# Patient Record
Sex: Female | Born: 1940
Health system: Southern US, Community
[De-identification: ages and names within clinical notes are randomized; demographics above are authoritative.]

## PROBLEM LIST (undated history)

## (undated) DIAGNOSIS — I10 Essential (primary) hypertension: Secondary | ICD-10-CM

## (undated) DIAGNOSIS — D649 Anemia, unspecified: Secondary | ICD-10-CM

## (undated) DIAGNOSIS — R0600 Dyspnea, unspecified: Secondary | ICD-10-CM

## (undated) DIAGNOSIS — K219 Gastro-esophageal reflux disease without esophagitis: Secondary | ICD-10-CM

## (undated) DIAGNOSIS — E785 Hyperlipidemia, unspecified: Secondary | ICD-10-CM

## (undated) DIAGNOSIS — J449 Chronic obstructive pulmonary disease, unspecified: Secondary | ICD-10-CM

## (undated) DIAGNOSIS — C73 Malignant neoplasm of thyroid gland: Secondary | ICD-10-CM

## (undated) DIAGNOSIS — J45909 Unspecified asthma, uncomplicated: Secondary | ICD-10-CM

## (undated) HISTORY — PX: THYROID SURGERY: SHX805

## (undated) HISTORY — DX: Hyperlipidemia, unspecified: E78.5

## (undated) HISTORY — DX: Unspecified asthma, uncomplicated: J45.909

## (undated) HISTORY — DX: Gastro-esophageal reflux disease without esophagitis: K21.9

## (undated) HISTORY — DX: Anemia, unspecified: D64.9

## (undated) HISTORY — DX: Malignant neoplasm of thyroid gland: C73

## (undated) MED FILL — Iron Sucrose Inj 20 MG/ML (Fe Equiv): INTRAVENOUS | Qty: 10 | Status: AC

---

## 1971-10-25 HISTORY — PX: ABDOMINAL HYSTERECTOMY: SHX81

## 2014-09-01 DIAGNOSIS — M109 Gout, unspecified: Secondary | ICD-10-CM | POA: Insufficient documentation

## 2014-09-01 DIAGNOSIS — J45909 Unspecified asthma, uncomplicated: Secondary | ICD-10-CM | POA: Insufficient documentation

## 2014-09-01 DIAGNOSIS — K219 Gastro-esophageal reflux disease without esophagitis: Secondary | ICD-10-CM | POA: Insufficient documentation

## 2014-09-01 DIAGNOSIS — I10 Essential (primary) hypertension: Secondary | ICD-10-CM | POA: Insufficient documentation

## 2014-09-01 DIAGNOSIS — I7300 Raynaud's syndrome without gangrene: Secondary | ICD-10-CM | POA: Insufficient documentation

## 2014-09-01 DIAGNOSIS — C73 Malignant neoplasm of thyroid gland: Secondary | ICD-10-CM | POA: Insufficient documentation

## 2014-09-24 DIAGNOSIS — J392 Other diseases of pharynx: Secondary | ICD-10-CM | POA: Insufficient documentation

## 2015-08-05 DIAGNOSIS — J3089 Other allergic rhinitis: Secondary | ICD-10-CM | POA: Insufficient documentation

## 2015-09-28 DIAGNOSIS — R42 Dizziness and giddiness: Secondary | ICD-10-CM | POA: Insufficient documentation

## 2015-10-21 ENCOUNTER — Telehealth: Payer: Self-pay | Admitting: *Deleted

## 2015-10-21 ENCOUNTER — Inpatient Hospital Stay: Payer: Medicare Other | Attending: Internal Medicine | Admitting: Internal Medicine

## 2015-10-21 ENCOUNTER — Encounter: Payer: Self-pay | Admitting: Internal Medicine

## 2015-10-21 ENCOUNTER — Inpatient Hospital Stay: Payer: Medicare Other

## 2015-10-21 VITALS — BP 95/63 | HR 66 | Temp 96.5°F | Resp 18 | Ht 67.6 in | Wt 181.0 lb

## 2015-10-21 DIAGNOSIS — D539 Nutritional anemia, unspecified: Secondary | ICD-10-CM

## 2015-10-21 DIAGNOSIS — E785 Hyperlipidemia, unspecified: Secondary | ICD-10-CM | POA: Diagnosis not present

## 2015-10-21 DIAGNOSIS — D649 Anemia, unspecified: Secondary | ICD-10-CM

## 2015-10-21 DIAGNOSIS — Z808 Family history of malignant neoplasm of other organs or systems: Secondary | ICD-10-CM

## 2015-10-21 DIAGNOSIS — K219 Gastro-esophageal reflux disease without esophagitis: Secondary | ICD-10-CM | POA: Diagnosis not present

## 2015-10-21 DIAGNOSIS — J45909 Unspecified asthma, uncomplicated: Secondary | ICD-10-CM

## 2015-10-21 DIAGNOSIS — Z79899 Other long term (current) drug therapy: Secondary | ICD-10-CM | POA: Diagnosis not present

## 2015-10-21 DIAGNOSIS — Z85850 Personal history of malignant neoplasm of thyroid: Secondary | ICD-10-CM | POA: Diagnosis not present

## 2015-10-21 DIAGNOSIS — Z801 Family history of malignant neoplasm of trachea, bronchus and lung: Secondary | ICD-10-CM | POA: Diagnosis not present

## 2015-10-21 LAB — CBC WITH DIFFERENTIAL/PLATELET
BASOS ABS: 0 10*3/uL (ref 0–0.1)
Basophils Relative: 1 %
EOS ABS: 0.2 10*3/uL (ref 0–0.7)
EOS PCT: 4 %
HCT: 32.9 % — ABNORMAL LOW (ref 35.0–47.0)
HEMOGLOBIN: 10.9 g/dL — AB (ref 12.0–16.0)
Lymphocytes Relative: 15 %
Lymphs Abs: 0.6 10*3/uL — ABNORMAL LOW (ref 1.0–3.6)
MCH: 31.6 pg (ref 26.0–34.0)
MCHC: 33.3 g/dL (ref 32.0–36.0)
MCV: 94.9 fL (ref 80.0–100.0)
Monocytes Absolute: 0.4 10*3/uL (ref 0.2–0.9)
Monocytes Relative: 10 %
NEUTROS PCT: 70 %
Neutro Abs: 2.7 10*3/uL (ref 1.4–6.5)
PLATELETS: 352 10*3/uL (ref 150–440)
RBC: 3.46 MIL/uL — AB (ref 3.80–5.20)
RDW: 14.5 % (ref 11.5–14.5)
WBC: 3.8 10*3/uL (ref 3.6–11.0)

## 2015-10-21 LAB — LACTATE DEHYDROGENASE: LDH: 124 U/L (ref 98–192)

## 2015-10-21 LAB — DAT, POLYSPECIFIC AHG (ARMC ONLY): POLYSPECIFIC AHG TEST: NEGATIVE

## 2015-10-21 NOTE — Progress Notes (Signed)
[place] @ [facility] [phone]) [phone]  [phone]     Alaney Witter OB: Sep 12, 1941  MR#: [id number]  NIO#:[id number]  Patient Care Team: Claudie Leach, PA-C as PCP - General (Physician Assistant)  CHIEF COMPLAINT:  Chief Complaint  Patient presents with  . macrocytic anemia     No history exists.    No flowsheet data found.  HISTORY OF PRESENT ILLNESS:   Sara Fields is I 74 year old lady who is referred to our clinic for evaluation of macrocytic anemia. She has history of anemia in the past, when the hemoglobin reportedly dropped to 4 g/dL, and she required red blood cell transfusion. She does not remember what was the reason for such a profound anemia. She was treated with radioactive iodine for recurrent thyroid cancer in 1999 in 2015, but she claims that the last iodine infusion was performed in July 2016. She was seen by her primary care physician, who detected a significant anemia in December 2016. The workup showed normal vitamin B12, folate, TSH. The patient does not have any specific complaints at this time except for the back pain, which she attributes to muscle strain, which happened this morning. She specifically denies nausea, vomiting, chest pain, dizziness, shortness of breath, weight loss, bleeding, night sweats.  REVIEW OF SYSTEMS:   Review of Systems  All other systems reviewed and are negative.    PAST MEDICAL HISTORY: Past Medical History  Diagnosis Date  . Anemia   . Thyroid cancer ([place]) 1999 and 2015  . Asthma   . GERD (gastroesophageal reflux disease)   . Hyperlipidemia     PAST SURGICAL HISTORY: Past Surgical History  Procedure Laterality Date  . Abdominal hysterectomy  1973    partial  . Thyroid surgery  1999    FAMILY HISTORY Family History  Problem Relation Age of Onset  . Lung cancer Father   . Bone cancer Sister   . Lung cancer Brother   . Kidney cancer Sister   . Lung cancer Sister     ADVANCED DIRECTIVES:  No  flowsheet data found.  HEALTH MAINTENANCE: Social History  Substance Use Topics  . Smoking status: Never Smoker   . Smokeless tobacco: Never Used  . Alcohol Use: No     No Known Allergies  Current Outpatient Prescriptions  Medication Sig Dispense Refill  . albuterol (PROVENTIL HFA;VENTOLIN HFA) 108 (90 Base) MCG/ACT inhaler Inhale into the lungs every 6 (six) hours as needed for wheezing or shortness of breath.    . allopurinol (ZYLOPRIM) 100 MG tablet Take 100 mg by mouth daily.    . budesonide-formoterol (SYMBICORT) 160-4.5 MCG/ACT inhaler Inhale 2 puffs into the lungs 2 (two) times daily.    . bumetanide (BUMEX) 1 MG tablet Take 1 mg by mouth daily.    . calcium carbonate (CALCIUM 600) 600 MG TABS tablet Take 600 mg by mouth 2 (two) times daily with a meal.    . Cholecalciferol (VITAMIN D3) 2000 units TABS Take 1 Dose by mouth daily.    Marland Kitchen lisinopril (PRINIVIL,ZESTRIL) 20 MG tablet Take 20 mg by mouth daily.    . montelukast (SINGULAIR) 10 MG tablet Take 10 mg by mouth at bedtime.    . Omega-3 Fatty Acids (OMEGA-3 FISH OIL) 300 MG CAPS Take 1 Dose by mouth daily.    Marland Kitchen omeprazole (PRILOSEC) 40 MG capsule Take 40 mg by mouth daily.    . Potassium 99 MG TABS Take 1 tablet by mouth daily.    . pravastatin (PRAVACHOL) 40  MG tablet Take 40 mg by mouth daily.    . propranolol (INDERAL) 20 MG tablet Take 20 mg by mouth 3 (three) times daily.    . traMADol (ULTRAM) 50 MG tablet Take 50 mg by mouth every 6 (six) hours as needed.     No current facility-administered medications for this visit.    OBJECTIVE:  Filed Vitals:   10/21/15 0952  BP: 95/63  Pulse: 66  Temp: 96.5 F (35.8 C)  Resp: 18     Body mass index is 27.85 kg/(m^2).    ECOG FS:1 - Symptomatic but completely ambulatory  Physical Exam  Constitutional: She is oriented to person, place, and time and well-developed, well-nourished, and in no distress. No distress.  Younger than stated age 82 female  HENT:    Head: Normocephalic and atraumatic.  Right Ear: External ear normal.  Left Ear: External ear normal.  Mouth/Throat: Oropharynx is clear and moist.  Eyes: Conjunctivae are normal. Pupils are equal, round, and reactive to light. Right eye exhibits no discharge. Left eye exhibits no discharge. No scleral icterus.  Neck: Normal range of motion. Neck supple. No JVD present. No tracheal deviation present. No thyromegaly present.  Cardiovascular: Normal rate, regular rhythm, normal heart sounds and intact distal pulses.  Exam reveals no gallop and no friction rub.   No murmur heard. Pulmonary/Chest: Effort normal and breath sounds normal. No stridor. No respiratory distress. She has no wheezes. She has no rales. She exhibits no tenderness.  Abdominal: Soft. Bowel sounds are normal. She exhibits no distension and no mass. There is no tenderness. There is no rebound and no guarding.  Genitourinary:  Postponed  Musculoskeletal: Normal range of motion. She exhibits no edema or tenderness.  Lymphadenopathy:    She has no cervical adenopathy.  Neurological: She is alert and oriented to person, place, and time. She has normal reflexes. No cranial nerve deficit. She exhibits normal muscle tone. Gait normal. Coordination normal. GCS score is 15.  Skin: Skin is warm. No rash noted. She is not diaphoretic. No erythema. No pallor.  Psychiatric: Mood, memory, affect and judgment normal.  Nursing note and vitals reviewed.    LAB RESULTS:  CBC Latest Ref Rng 10/21/2015  WBC 3.6 - 11.0 K/uL 3.8  Hemoglobin 12.0 - 16.0 g/dL 10.9(L)  Hematocrit 35.0 - 47.0 % 32.9(L)  Platelets 150 - 440 K/uL 352    Appointment on 10/21/2015  Component Date Value Ref Range Status  . WBC 10/21/2015 3.8  3.6 - 11.0 K/uL Final  . RBC 10/21/2015 3.46* 3.80 - 5.20 MIL/uL Final  . Hemoglobin 10/21/2015 10.9* 12.0 - 16.0 g/dL Final  . HCT 10/21/2015 32.9* 35.0 - 47.0 % Final  . MCV 10/21/2015 94.9  80.0 - 100.0 fL Final  .  MCH 10/21/2015 31.6  26.0 - 34.0 pg Final  . MCHC 10/21/2015 33.3  32.0 - 36.0 g/dL Final  . RDW 10/21/2015 14.5  11.5 - 14.5 % Final  . Platelets 10/21/2015 352  150 - 440 K/uL Final  . Neutrophils Relative % 10/21/2015 70   Final  . Neutro Abs 10/21/2015 2.7  1.4 - 6.5 K/uL Final  . Lymphocytes Relative 10/21/2015 15   Final  . Lymphs Abs 10/21/2015 0.6* 1.0 - 3.6 K/uL Final  . Monocytes Relative 10/21/2015 10   Final  . Monocytes Absolute 10/21/2015 0.4  0.2 - 0.9 K/uL Final  . Eosinophils Relative 10/21/2015 4   Final  . Eosinophils Absolute 10/21/2015 0.2  0 - 0.7  K/uL Final  . Basophils Relative 10/21/2015 1   Final  . Basophils Absolute 10/21/2015 0.0  0 - 0.1 K/uL Final  . LDH 10/21/2015 124  98 - 192 U/L Final    10/05/2015- Folate 14.2 Vitamin B12 827 Sedimentation rate 63 mm per hour TSH 3.4 Absolute reticulocyte count 131,000 cells per microliter     STUDIES: No results found.  ASSESSMENT and MEDICAL DECISION MAKING:   Anemia- it appears that as of November 2015 patient's hemoglobin was entirely within normal range. It dropped significantly a year later from 13 g/dL to 8 g/dL, without evidence of blood loss. In early 2016 Ms. Swan received treatment with radioactive iodine 131 for recurring thyroid cancer with the last dose of radioactive iodine administered in early July 2016. An extensive workup revealed no evidence of vitamin B12, folate deficiency, hypothyroidism, evidence of hemolysis, liver or spleen abnormalities. Hemoglobin on today's measurement appears to be significantly higher than previously reported. There is a possibility of that the anemia was caused by myelosuppressive effects of radioactive iodine. However, myelodysplastic syndrome is always on differential least for the patient of 74 years of age. My initial intention was to proceed with a bone marrow biopsy, however, taking into account significant improvement in hemoglobin concentration, we will defer  bone marrow biopsy at this point, but rather see her back in 1 month's time in order to reevaluate her hematologic parameters and make decision on whether to proceed with bone marrow biopsy on not at that time. In the meantime we will check haptoglobin, LDH and Coombs test to rule out hemolytic reaction.   Patient expressed understanding and was in agreement with this plan. She also understands that She can call clinic at any time with any questions, concerns, or complaints.    No matching staging information was found for the patient.  Roxana Hires, MD   10/21/2015 9:54 AM

## 2015-10-21 NOTE — Addendum Note (Signed)
Addended by: Luella Cook on: 10/21/2015 01:01 PM   Modules accepted: Orders

## 2015-10-21 NOTE — Telephone Encounter (Signed)
Pt contacted and Dr. Rudean Hitt wanted her to know that her hgb came up to over 10 and he now does not feel that she has to have bone marrow bx.  Would like to see her back in 1 month with labs and she is agreeable to that.  I entered the orders and I have got Lattie Haw to cancel the request for ct guided bx. Pt will receive her new appt in mail

## 2015-10-21 NOTE — Progress Notes (Signed)
Pt had anemia in the past and for years it was good and then it has dec. Again.  She feels ok except for she hurt her back.  She does not see blood in stool or urine.  Her last GI eval showed 1 polyp.  She has hgb of 4 ,years ago and got blood and had complete work up and they never found a bleeding spot. She was taken off her iron pill this week because the levels were normal

## 2015-10-22 LAB — HAPTOGLOBIN: Haptoglobin: 220 mg/dL — ABNORMAL HIGH (ref 34–200)

## 2015-11-16 ENCOUNTER — Ambulatory Visit: Payer: Medicare Other | Admitting: Anesthesiology

## 2015-11-16 ENCOUNTER — Ambulatory Visit
Admission: RE | Admit: 2015-11-16 | Discharge: 2015-11-16 | Disposition: A | Discharge status: Home / Self Care | Payer: Medicare Other | Source: Ambulatory Visit | Attending: Gastroenterology | Admitting: Gastroenterology

## 2015-11-16 ENCOUNTER — Encounter
Admission: RE | Disposition: A | Discharge status: Home / Self Care | Payer: Self-pay | Source: Ambulatory Visit | Attending: Gastroenterology

## 2015-11-16 ENCOUNTER — Encounter: Payer: Self-pay | Admitting: *Deleted

## 2015-11-16 DIAGNOSIS — E785 Hyperlipidemia, unspecified: Secondary | ICD-10-CM | POA: Insufficient documentation

## 2015-11-16 DIAGNOSIS — Z801 Family history of malignant neoplasm of trachea, bronchus and lung: Secondary | ICD-10-CM | POA: Diagnosis not present

## 2015-11-16 DIAGNOSIS — Z7951 Long term (current) use of inhaled steroids: Secondary | ICD-10-CM | POA: Diagnosis not present

## 2015-11-16 DIAGNOSIS — R1310 Dysphagia, unspecified: Secondary | ICD-10-CM | POA: Diagnosis present

## 2015-11-16 DIAGNOSIS — K210 Gastro-esophageal reflux disease with esophagitis: Secondary | ICD-10-CM | POA: Diagnosis not present

## 2015-11-16 DIAGNOSIS — Z85850 Personal history of malignant neoplasm of thyroid: Secondary | ICD-10-CM | POA: Insufficient documentation

## 2015-11-16 DIAGNOSIS — D649 Anemia, unspecified: Secondary | ICD-10-CM | POA: Insufficient documentation

## 2015-11-16 DIAGNOSIS — Z8051 Family history of malignant neoplasm of kidney: Secondary | ICD-10-CM | POA: Diagnosis not present

## 2015-11-16 DIAGNOSIS — Z808 Family history of malignant neoplasm of other organs or systems: Secondary | ICD-10-CM | POA: Diagnosis not present

## 2015-11-16 DIAGNOSIS — K222 Esophageal obstruction: Secondary | ICD-10-CM | POA: Insufficient documentation

## 2015-11-16 DIAGNOSIS — K449 Diaphragmatic hernia without obstruction or gangrene: Secondary | ICD-10-CM | POA: Diagnosis not present

## 2015-11-16 DIAGNOSIS — Z90710 Acquired absence of both cervix and uterus: Secondary | ICD-10-CM | POA: Insufficient documentation

## 2015-11-16 DIAGNOSIS — Z79899 Other long term (current) drug therapy: Secondary | ICD-10-CM | POA: Insufficient documentation

## 2015-11-16 DIAGNOSIS — J45909 Unspecified asthma, uncomplicated: Secondary | ICD-10-CM | POA: Insufficient documentation

## 2015-11-16 DIAGNOSIS — I10 Essential (primary) hypertension: Secondary | ICD-10-CM | POA: Insufficient documentation

## 2015-11-16 HISTORY — PX: ESOPHAGOGASTRODUODENOSCOPY (EGD) WITH PROPOFOL: SHX5813

## 2015-11-16 HISTORY — DX: Essential (primary) hypertension: I10

## 2015-11-16 SURGERY — ESOPHAGOGASTRODUODENOSCOPY (EGD) WITH PROPOFOL
Anesthesia: General

## 2015-11-16 MED ORDER — PROPOFOL 500 MG/50ML IV EMUL
INTRAVENOUS | Status: DC | PRN
  Administered 2015-11-16: 120 ug/kg/min via INTRAVENOUS

## 2015-11-16 MED ORDER — SODIUM CHLORIDE 0.9 % IV SOLN
INTRAVENOUS | Status: DC
  Administered 2015-11-16: 10:00:00 via INTRAVENOUS

## 2015-11-16 MED ORDER — LIDOCAINE HCL (CARDIAC) 20 MG/ML IV SOLN
INTRAVENOUS | Status: DC | PRN
  Administered 2015-11-16: 100 mg via INTRAVENOUS

## 2015-11-16 NOTE — Anesthesia Procedure Notes (Signed)
Performed by: COOK-MARTIN, Tywanna Seifer Pre-anesthesia Checklist: Patient identified, Emergency Drugs available, Suction available, Patient being monitored and Timeout performed Patient Re-evaluated:Patient Re-evaluated prior to inductionPreoxygenation: Pre-oxygenation with 100% oxygen Intubation Type: IV induction Placement Confirmation: positive ETCO2 and CO2 detector       

## 2015-11-16 NOTE — Discharge Instructions (Signed)

## 2015-11-16 NOTE — H&P (Signed)
Primary Care Physician:  Adin Hector, MD  Pre-Procedure History & Physical: HPI:  Sara Fields is a 75 y.o. female is here for an endoscopy.   Past Medical History  Diagnosis Date  . Anemia   . Thyroid cancer ([place]) 1999 and 2015  . Asthma   . GERD (gastroesophageal reflux disease)   . Hyperlipidemia   . Hypertension     Past Surgical History  Procedure Laterality Date  . Abdominal hysterectomy  1973    partial  . Thyroid surgery  1999    Prior to Admission medications   Medication Sig Start Date End Date Taking? Authorizing Provider  budesonide-formoterol (SYMBICORT) 160-4.5 MCG/ACT inhaler Inhale 2 puffs into the lungs 2 (two) times daily.   Yes Historical Provider, MD  propranolol (INDERAL) 20 MG tablet Take 20 mg by mouth 3 (three) times daily.   Yes Historical Provider, MD  albuterol (PROVENTIL HFA;VENTOLIN HFA) 108 (90 Base) MCG/ACT inhaler Inhale into the lungs every 6 (six) hours as needed for wheezing or shortness of breath.    Historical Provider, MD  allopurinol (ZYLOPRIM) 100 MG tablet Take 100 mg by mouth daily.    Historical Provider, MD  bumetanide (BUMEX) 1 MG tablet Take 1 mg by mouth daily.    Historical Provider, MD  calcium carbonate (CALCIUM 600) 600 MG TABS tablet Take 600 mg by mouth 2 (two) times daily with a meal.    Historical Provider, MD  Cholecalciferol (VITAMIN D3) 2000 units TABS Take 1 Dose by mouth daily.    Historical Provider, MD  lisinopril (PRINIVIL,ZESTRIL) 20 MG tablet Take 20 mg by mouth daily.    Historical Provider, MD  montelukast (SINGULAIR) 10 MG tablet Take 10 mg by mouth at bedtime.    Historical Provider, MD  Omega-3 Fatty Acids (OMEGA-3 FISH OIL) 300 MG CAPS Take 1 Dose by mouth daily.    Historical Provider, MD  omeprazole (PRILOSEC) 40 MG capsule Take 40 mg by mouth daily.    Historical Provider, MD  Potassium 99 MG TABS Take 1 tablet by mouth daily.    Historical Provider, MD  pravastatin (PRAVACHOL) 40 MG tablet Take 40  mg by mouth daily.    Historical Provider, MD  traMADol (ULTRAM) 50 MG tablet Take 50 mg by mouth every 6 (six) hours as needed.    Historical Provider, MD    Allergies as of 10/13/2015  . (Not on File)    Family History  Problem Relation Age of Onset  . Lung cancer Father   . Bone cancer Sister   . Lung cancer Brother   . Kidney cancer Sister   . Lung cancer Sister     Social History   Social History  . Marital Status: Single    Spouse Name: N/A  . Number of Children: N/A  . Years of Education: N/A   Occupational History  . Not on file.   Social History Main Topics  . Smoking status: Never Smoker   . Smokeless tobacco: Never Used  . Alcohol Use: No  . Drug Use: No  . Sexual Activity: Not on file   Other Topics Concern  . Not on file   Social History Narrative     Physical Exam: BP 131/74 mmHg  Pulse 72  Temp(Src) 98 F (36.7 C) (Tympanic)  Resp 18  Ht 5\' 7"  (1.702 m)  Wt 82.101 kg (181 lb)  BMI 28.34 kg/m2  SpO2 100% General:   Alert,  pleasant and cooperative in  NAD Head:  Normocephalic and atraumatic. Neck:  Supple; no masses or thyromegaly. Lungs:  Clear throughout to auscultation.    Heart:  Regular rate and rhythm. Abdomen:  Soft, nontender and nondistended. Normal bowel sounds, without guarding, and without rebound.   Neurologic:  Alert and  oriented x4;  grossly normal neurologically.  Impression/Plan: Sara Fields is here for an endoscopy to be performed for dysphagia  Risks, benefits, limitations, and alternatives regarding endoscopy have been reviewed with the patient.  Questions have been answered.  All parties agreeable.   Josefine Class, MD  11/16/2015, 10:19 AM

## 2015-11-16 NOTE — Anesthesia Postprocedure Evaluation (Signed)
Anesthesia Post Note  Patient: Sara Fields  Procedure(s) Performed: Procedure(s) (LRB): ESOPHAGOGASTRODUODENOSCOPY (EGD) WITH PROPOFOL (N/A)  Patient location during evaluation: PACU Anesthesia Type: General Level of consciousness: awake and alert and oriented Pain management: pain level controlled Vital Signs Assessment: post-procedure vital signs reviewed and stable Respiratory status: spontaneous breathing Cardiovascular status: blood pressure returned to baseline Anesthetic complications: no    Last Vitals:  Filed Vitals:   11/16/15 1120 11/16/15 1123  BP: 128/90 128/90  Pulse: 65 63  Temp:    Resp: 11 15    Last Pain: There were no vitals filed for this visit.               Alexia Dinger

## 2015-11-16 NOTE — Anesthesia Preprocedure Evaluation (Signed)
Anesthesia Evaluation  Patient identified by MRN, date of birth, ID band  Reviewed: Allergy & Precautions, NPO status , Patient's Chart, lab work & pertinent test results  Airway Mallampati: II  TM Distance: >3 FB     Dental  (+) Upper Dentures, Lower Dentures   Pulmonary asthma ,    Pulmonary exam normal breath sounds clear to auscultation       Cardiovascular hypertension, Pt. on medications Normal cardiovascular exam     Neuro/Psych negative neurological ROS  negative psych ROS   GI/Hepatic Neg liver ROS, GERD  Medicated and Controlled,  Endo/Other  Thyroid CA  Renal/GU negative Renal ROS  negative genitourinary   Musculoskeletal negative musculoskeletal ROS (+)   Abdominal Normal abdominal exam  (+)   Peds negative pediatric ROS (+)  Hematology  (+) anemia ,   Anesthesia Other Findings   Reproductive/Obstetrics                             Anesthesia Physical Anesthesia Plan  ASA: III  Anesthesia Plan: General   Post-op Pain Management:    Induction: Intravenous  Airway Management Planned: Nasal Cannula  Additional Equipment:   Intra-op Plan:   Post-operative Plan:   Informed Consent: I have reviewed the patients History and Physical, chart, labs and discussed the procedure including the risks, benefits and alternatives for the proposed anesthesia with the patient or authorized representative who has indicated his/her understanding and acceptance.   Dental advisory given  Plan Discussed with: CRNA and Surgeon  Anesthesia Plan Comments:         Anesthesia Quick Evaluation

## 2015-11-16 NOTE — Transfer of Care (Signed)
Immediate Anesthesia Transfer of Care Note  Patient: Sara Fields  Procedure(s) Performed: Procedure(s): ESOPHAGOGASTRODUODENOSCOPY (EGD) WITH PROPOFOL (N/A)  Patient Location: PACU  Anesthesia Type:General  Level of Consciousness: awake and oriented  Airway & Oxygen Therapy: Patient Spontanous Breathing and Patient connected to nasal cannula oxygen  Post-op Assessment: Report given to RN and Post -op Vital signs reviewed and stable  Post vital signs: Reviewed and stable  Last Vitals:  Filed Vitals:   11/16/15 1005  BP: 131/74  Pulse: 72  Temp: 36.7 C  Resp: 18    Complications: No apparent anesthesia complications

## 2015-11-16 NOTE — Op Note (Signed)
[facility] Gastroenterology Patient Name: Sara Fields Procedure Date: 11/16/2015 10:22 AM MRN: [id number] Account #: [id number] Date of Birth: [birth year] Admit Type: Outpatient Age: 75 Room: [facility] ENDO ROOM 2 Gender: Female Note Status: Finalized Procedure:         Upper GI endoscopy Indications:       Dysphagia Patient Profile:   This is a 75 year old female. Providers:         Gerrit Heck. Rayann Heman, MD Referring MD:      Ramonita Lab, MD (Referring MD) Medicines:         Propofol per Anesthesia Complications:     No immediate complications. Procedure:         Pre-Anesthesia Assessment:                    - Prior to the procedure, a History and Physical was                     performed, and patient medications, allergies and                     sensitivities were reviewed. The patient's tolerance of                     previous anesthesia was reviewed.                    After obtaining informed consent, the endoscope was passed                     under direct vision. Throughout the procedure, the                     patient's blood pressure, pulse, and oxygen saturations                     were monitored continuously. The Olympus GIF-160 endoscope                     (S#. [id number]) was introduced through the mouth, and                     advanced to the second part of duodenum. The upper GI                     endoscopy was accomplished without difficulty. The patient                     tolerated the procedure well. Findings:      A large hiatus hernia was present.      A widely patent Schatzki ring (acquired) was found at the       gastroesophageal junction. A TTS dilator was passed through the scope.       Dilation with a 15-16.5-18 mm x 5.5 cm CRE balloon (to a maximum balloon       size of 18 mm) dilator was performed.      LA Grade B (one or more mucosal breaks greater than 5 mm, not extending       between the tops of two mucosal folds) esophagitis was  found in the       lower third of the esophagus. Biopsies were taken with a cold forceps       for histology.      The stomach was normal.  The examined duodenum was normal. Impression:        - Large hiatus hernia.                    - Widely patent Schatzki ring. Dilated.                    - LA Grade B reflux esophagitis. Biopsied.                    - Normal stomach.                    - Normal examined duodenum. Recommendation:    - Observe patient in GI recovery unit.                    - Resume regular diet.                    - Continue present medications.                    - Use Protonix (pantoprazole) 40 mg PO daily.                    - Await pathology results.                    - Obtain barium swallow to assess hiatal hernia, r/o                     para-esophageal hernia.                    - Call GI clinic if trouble swallowing does not improve.                    - The findings and recommendations were discussed with the                     patient.                    - The findings and recommendations were discussed with the                     patient's family. Procedure Code(s): --- Professional ---                    [phone], Esophagogastroduodenoscopy, flexible, transoral;                     with transendoscopic balloon dilation of esophagus (less                     than 30 mm diameter)                    43239, Esophagogastroduodenoscopy, flexible, transoral;                     with biopsy, single or multiple Diagnosis Code(s): --- Professional ---                    K44.9, Diaphragmatic hernia without obstruction or gangrene                    K22.2, Esophageal obstruction                    K21.0, Gastro-esophageal reflux  disease with esophagitis                    R13.10, Dysphagia, unspecified CPT copyright 2014 American Medical Association. All rights reserved. The codes documented in this report are preliminary and upon coder review may  be  revised to meet current compliance requirements. Mellody Life, MD 11/16/2015 10:50:29 AM This report has been signed electronically. Number of Addenda: 0 Note Initiated On: 11/16/2015 10:22 AM      [facility]

## 2015-11-17 LAB — SURGICAL PATHOLOGY

## 2015-11-18 ENCOUNTER — Encounter: Payer: Self-pay | Admitting: Gastroenterology

## 2015-11-18 ENCOUNTER — Other Ambulatory Visit: Payer: Self-pay

## 2015-11-18 ENCOUNTER — Ambulatory Visit: Payer: Self-pay

## 2015-11-18 ENCOUNTER — Other Ambulatory Visit: Payer: Self-pay | Admitting: Gastroenterology

## 2015-11-18 DIAGNOSIS — K449 Diaphragmatic hernia without obstruction or gangrene: Secondary | ICD-10-CM

## 2015-11-23 ENCOUNTER — Other Ambulatory Visit: Payer: Self-pay | Admitting: Internal Medicine

## 2015-11-23 DIAGNOSIS — D649 Anemia, unspecified: Secondary | ICD-10-CM

## 2015-11-24 ENCOUNTER — Inpatient Hospital Stay: Payer: Medicare Other

## 2015-11-24 ENCOUNTER — Encounter (INDEPENDENT_AMBULATORY_CARE_PROVIDER_SITE_OTHER): Payer: Self-pay

## 2015-11-24 ENCOUNTER — Encounter: Payer: Self-pay | Admitting: Internal Medicine

## 2015-11-24 ENCOUNTER — Inpatient Hospital Stay: Payer: Medicare Other | Attending: Internal Medicine | Admitting: Internal Medicine

## 2015-11-24 VITALS — BP 132/81 | HR 67 | Temp 98.2°F | Resp 18 | Ht 67.0 in | Wt 182.3 lb

## 2015-11-24 DIAGNOSIS — D649 Anemia, unspecified: Secondary | ICD-10-CM

## 2015-11-24 DIAGNOSIS — R5383 Other fatigue: Secondary | ICD-10-CM

## 2015-11-24 DIAGNOSIS — J45909 Unspecified asthma, uncomplicated: Secondary | ICD-10-CM | POA: Diagnosis not present

## 2015-11-24 DIAGNOSIS — I10 Essential (primary) hypertension: Secondary | ICD-10-CM

## 2015-11-24 DIAGNOSIS — Z808 Family history of malignant neoplasm of other organs or systems: Secondary | ICD-10-CM

## 2015-11-24 DIAGNOSIS — Z8051 Family history of malignant neoplasm of kidney: Secondary | ICD-10-CM

## 2015-11-24 DIAGNOSIS — D539 Nutritional anemia, unspecified: Secondary | ICD-10-CM

## 2015-11-24 DIAGNOSIS — K219 Gastro-esophageal reflux disease without esophagitis: Secondary | ICD-10-CM

## 2015-11-24 DIAGNOSIS — E785 Hyperlipidemia, unspecified: Secondary | ICD-10-CM

## 2015-11-24 DIAGNOSIS — Z801 Family history of malignant neoplasm of trachea, bronchus and lung: Secondary | ICD-10-CM

## 2015-11-24 DIAGNOSIS — Z85850 Personal history of malignant neoplasm of thyroid: Secondary | ICD-10-CM | POA: Diagnosis not present

## 2015-11-24 DIAGNOSIS — Z79899 Other long term (current) drug therapy: Secondary | ICD-10-CM | POA: Diagnosis not present

## 2015-11-24 DIAGNOSIS — Z923 Personal history of irradiation: Secondary | ICD-10-CM | POA: Diagnosis not present

## 2015-11-24 LAB — CBC WITH DIFFERENTIAL/PLATELET
Basophils Absolute: 0 10*3/uL (ref 0–0.1)
Basophils Relative: 1 %
EOS ABS: 0.2 10*3/uL (ref 0–0.7)
Eosinophils Relative: 4 %
HCT: 30.9 % — ABNORMAL LOW (ref 35.0–47.0)
HEMOGLOBIN: 10.4 g/dL — AB (ref 12.0–16.0)
LYMPHS ABS: 0.7 10*3/uL — AB (ref 1.0–3.6)
Lymphocytes Relative: 15 %
MCH: 31.1 pg (ref 26.0–34.0)
MCHC: 33.7 g/dL (ref 32.0–36.0)
MCV: 92.4 fL (ref 80.0–100.0)
Monocytes Absolute: 0.4 10*3/uL (ref 0.2–0.9)
Monocytes Relative: 8 %
NEUTROS PCT: 72 %
Neutro Abs: 3.4 10*3/uL (ref 1.4–6.5)
Platelets: 338 10*3/uL (ref 150–440)
RBC: 3.35 MIL/uL — AB (ref 3.80–5.20)
RDW: 14.6 % — ABNORMAL HIGH (ref 11.5–14.5)
WBC: 4.7 10*3/uL (ref 3.6–11.0)

## 2015-11-24 NOTE — Progress Notes (Signed)
[place] @ [facility] [phone]  Fax:([phone]) [place] OB: 13-Aug-1941  MR#: [id number]  ATF#:[id number]  Patient Care Team: Adin Hector, MD as PCP - General (Internal Medicine)  CHIEF COMPLAINT:  No chief complaint on file.    No history exists.    No flowsheet data found.  HISTORY OF PRESENT ILLNESS:   Ms. Sara Fields is I 75 year old lady who is referred to our clinic for evaluation of macrocytic anemia. She has history of anemia in the past, when the hemoglobin reportedly dropped to 4 g/dL, and she required red blood cell transfusion. She does not remember what was the reason for such a profound anemia. She was treated with radioactive iodine for recurrent thyroid cancer in 1999 in 2015, but she claims that the last iodine infusion was performed in July 2016. She was seen by her primary care physician, who detected a significant anemia in December 2016. The workup showed normal vitamin B12, folate, TSH.   Current status: Sara Fields returns to our clinic for a follow-up visit and to discuss the results of the workup. She continues to feel somewhat tired, but does not have any new complaints.She specifically denies nausea, vomiting, chest pain, dizziness, shortness of breath, weight loss, bleeding, night sweats.   REVIEW OF SYSTEMS:   Review of Systems  All other systems reviewed and are negative.    PAST MEDICAL HISTORY: Past Medical History  Diagnosis Date  . Anemia   . Thyroid cancer ([place]) 1999 and 2015  . Asthma   . GERD (gastroesophageal reflux disease)   . Hyperlipidemia   . Hypertension     PAST SURGICAL HISTORY: Past Surgical History  Procedure Laterality Date  . Abdominal hysterectomy  1973    partial  . Thyroid surgery  1999  . Esophagogastroduodenoscopy (egd) with propofol N/A 11/16/2015    Procedure: ESOPHAGOGASTRODUODENOSCOPY (EGD) WITH PROPOFOL;  Surgeon: Josefine Class, MD;  Location: [facility] ENDOSCOPY;  Service:  Endoscopy;  Laterality: N/A;    FAMILY HISTORY Family History  Problem Relation Age of Onset  . Lung cancer Father   . Bone cancer Sister   . Lung cancer Brother   . Kidney cancer Sister   . Lung cancer Sister     ADVANCED DIRECTIVES:  No flowsheet data found.  HEALTH MAINTENANCE: Social History  Substance Use Topics  . Smoking status: Never Smoker   . Smokeless tobacco: Never Used  . Alcohol Use: No     No Known Allergies  Current Outpatient Prescriptions  Medication Sig Dispense Refill  . albuterol (PROVENTIL HFA;VENTOLIN HFA) 108 (90 Base) MCG/ACT inhaler Inhale into the lungs every 6 (six) hours as needed for wheezing or shortness of breath.    . allopurinol (ZYLOPRIM) 100 MG tablet Take 100 mg by mouth daily.    . budesonide-formoterol (SYMBICORT) 160-4.5 MCG/ACT inhaler Inhale 2 puffs into the lungs 2 (two) times daily.    . bumetanide (BUMEX) 1 MG tablet Take 1 mg by mouth daily.    . calcium carbonate (CALCIUM 600) 600 MG TABS tablet Take 600 mg by mouth 2 (two) times daily with a meal.    . Cholecalciferol (VITAMIN D3) 2000 units TABS Take 1 Dose by mouth daily.    Marland Kitchen lisinopril (PRINIVIL,ZESTRIL) 20 MG tablet Take 20 mg by mouth daily.    . montelukast (SINGULAIR) 10 MG tablet Take 10 mg by mouth at bedtime.    . Omega-3 Fatty Acids (OMEGA-3 FISH OIL) 300 MG CAPS  Take 1 Dose by mouth daily.    Marland Kitchen omeprazole (PRILOSEC) 40 MG capsule Take 40 mg by mouth daily.    . Potassium 99 MG TABS Take 1 tablet by mouth daily.    . pravastatin (PRAVACHOL) 40 MG tablet Take 40 mg by mouth daily.    . propranolol (INDERAL) 20 MG tablet Take 20 mg by mouth 3 (three) times daily.    . traMADol (ULTRAM) 50 MG tablet Take 50 mg by mouth every 6 (six) hours as needed.     No current facility-administered medications for this visit.    OBJECTIVE:  There were no vitals filed for this visit.   There is no weight on file to calculate BMI.    ECOG FS:1 - Symptomatic but completely  ambulatory  Physical Exam  Constitutional: She is oriented to person, place, and time and well-developed, well-nourished, and in no distress. No distress.  Younger than stated age 23 female  HENT:  Head: Normocephalic and atraumatic.  Right Ear: External ear normal.  Left Ear: External ear normal.  Mouth/Throat: Oropharynx is clear and moist.  Eyes: Conjunctivae are normal. Pupils are equal, round, and reactive to light. Right eye exhibits no discharge. Left eye exhibits no discharge. No scleral icterus.  Neck: Normal range of motion. Neck supple. No JVD present. No tracheal deviation present. No thyromegaly present.  Cardiovascular: Normal rate, regular rhythm, normal heart sounds and intact distal pulses.  Exam reveals no gallop and no friction rub.   No murmur heard. Pulmonary/Chest: Effort normal and breath sounds normal. No stridor. No respiratory distress. She has no wheezes. She has no rales. She exhibits no tenderness.  Abdominal: Soft. Bowel sounds are normal. She exhibits no distension and no mass. There is no tenderness. There is no rebound and no guarding.  Genitourinary:  Postponed  Musculoskeletal: Normal range of motion. She exhibits no edema or tenderness.  Lymphadenopathy:    She has no cervical adenopathy.  Neurological: She is alert and oriented to person, place, and time. She has normal reflexes. No cranial nerve deficit. She exhibits normal muscle tone. Gait normal. Coordination normal. GCS score is 15.  Skin: Skin is warm. No rash noted. She is not diaphoretic. No erythema. No pallor.  Psychiatric: Mood, memory, affect and judgment normal.  Nursing note and vitals reviewed.    LAB RESULTS:  CBC Latest Ref Rng 10/21/2015  WBC 3.6 - 11.0 K/uL 3.8  Hemoglobin 12.0 - 16.0 g/dL 10.9(L)  Hematocrit 35.0 - 47.0 % 32.9(L)  Platelets 150 - 440 K/uL 352    No visits with results within 5 Day(s) from this visit. Latest known visit with results  is:  Admission on 11/16/2015, Discharged on 11/16/2015  Component Date Value Ref Range Status  . SURGICAL PATHOLOGY 11/16/2015    Final                   Value:Surgical Pathology CASE: [id number] PATIENT: Sara Fields Surgical Pathology Report     SPECIMEN SUBMITTED: A. Esophagitis, cbx  CLINICAL HISTORY: None provided  PRE-OPERATIVE DIAGNOSIS: Anemia, dysphagia, GERD  POST-OPERATIVE DIAGNOSIS: Large hiatal hernia, esophagitis     DIAGNOSIS: A. ESOPHAGUS; COLD BIOPSY: - SQUAMOUS MUCOSA WITH CHANGES COMPATIBLE WITH REFLUX. - GASTRIC MUCOSA WITH MILD CHRONIC INFLAMMATION. - NEGATIVE FOR GOBLET CELLS, DYSPLASIA AND MALIGNANCY.   GROSS DESCRIPTION:  A. Labeled: esophagitis C BX  Tissue fragment(s): multiple  Size: aggregate, 1.3 x 0.4 x 0.1 cm  Description: pink to yellow fragments  Entirely submitted in  one cassette(s).    Final Diagnosis performed by Delorse Lek, MD.  Electronically signed 11/17/2015 2:11:19PM    The electronic signature indicates that the named Attending Pathologist has evaluated the specimen  Technical component performed at [facility], [address] [place], [place] [address]                          Lab: [phone] Dir: Darrick Penna. Evette Doffing, MD  Professional component performed at [facility], [facility], [place], [place], [place] [address] Lab: [phone] Dir: Dellia Nims. Rubinas, MD      10/05/2015- Folate 14.2 Vitamin B12 827 Sedimentation rate 63 mm per hour TSH 3.4 Absolute reticulocyte count 131,000 cells per microliter     STUDIES: No results found.  ASSESSMENT and MEDICAL DECISION MAKING:   Anemia- as noted previously, a very thorough workup previously failed to reveal any evidence of iron or vitamin deficiency. We did not see any evidence of hypersplenism, or red blood cell destruction. It is still likely that anemia is caused by radioactive iodine treatment, but the concern for mild  dysplastic syndrome still persists. We decided after discussing this problem with the patient to continue with monthly CBC checks and return to the clinic in 3 months to discuss the further plan of action. Bone marrow biopsy is still on the table and likely will be needed in the future, but no urgent need exists. If during our monthly CBC checks we see drastic change in her CBC picture, we will contact her to schedule a bone marrow biopsy. She, on the other hand, will contact us if she notices any change in the color of stool, appearance of jaundice or overall worsening of her functional status.   Patient expressed understanding and was in agreement with this plan. She also understands that She can call clinic at any time with any questions, concerns, or complaints.    No matching staging information was found for the patient.  Roxana Hires, MD   11/24/2015 9:37 AM

## 2015-11-24 NOTE — Progress Notes (Signed)
Fatigue is about the same as it was.  She still get chores but has to rest in between. The walk to mailbox is difficult and she rest. 1/4 of mile. No bleeding seen in her stool or urine

## 2015-11-25 ENCOUNTER — Telehealth: Payer: Self-pay | Admitting: *Deleted

## 2015-11-25 NOTE — Telephone Encounter (Addendum)
Asking that Dr Rudean Hitt call her (the daughter) to explain why she needs to have a bone marrow biopsy, and what it will accomplish to have it

## 2015-12-01 ENCOUNTER — Ambulatory Visit
Admission: RE | Admit: 2015-12-01 | Discharge: 2015-12-01 | Disposition: A | Discharge status: Home / Self Care | Payer: Medicare Other | Source: Ambulatory Visit | Attending: Gastroenterology | Admitting: Gastroenterology

## 2015-12-01 DIAGNOSIS — Z85850 Personal history of malignant neoplasm of thyroid: Secondary | ICD-10-CM | POA: Insufficient documentation

## 2015-12-01 DIAGNOSIS — R1310 Dysphagia, unspecified: Secondary | ICD-10-CM | POA: Insufficient documentation

## 2015-12-01 DIAGNOSIS — K449 Diaphragmatic hernia without obstruction or gangrene: Secondary | ICD-10-CM | POA: Diagnosis present

## 2015-12-10 ENCOUNTER — Encounter: Payer: Self-pay | Admitting: General Surgery

## 2015-12-10 DIAGNOSIS — M5136 Other intervertebral disc degeneration, lumbar region: Secondary | ICD-10-CM | POA: Insufficient documentation

## 2015-12-11 ENCOUNTER — Encounter: Payer: Self-pay | Admitting: General Surgery

## 2015-12-11 ENCOUNTER — Ambulatory Visit (INDEPENDENT_AMBULATORY_CARE_PROVIDER_SITE_OTHER): Payer: Medicare Other | Admitting: General Surgery

## 2015-12-11 ENCOUNTER — Encounter (INDEPENDENT_AMBULATORY_CARE_PROVIDER_SITE_OTHER): Payer: Self-pay

## 2015-12-11 VITALS — BP 145/75 | HR 72 | Temp 97.6°F | Ht 67.0 in | Wt 182.4 lb

## 2015-12-11 DIAGNOSIS — K219 Gastro-esophageal reflux disease without esophagitis: Secondary | ICD-10-CM | POA: Diagnosis not present

## 2015-12-11 NOTE — Progress Notes (Signed)
Patient ID: Sara Fields, female   DOB: August 04, 1941, 75 y.o.   MRN: [id number]  CC: Dysphagia  HPI Sara Fields is a 75 y.o. female who presents clinic for evaluation of a large hiatal hernia with gastroesophageal reflux disease. Patient states that she's been having reflux disease for at least 20 years. She's not she's had a hiatal hernia for about the same time. She has to sleep elevated on a Craftmatic bed. She states that this was initially brought for her back but has since become essential for help her with reflux disease. She has constant reflux that is much worse if she comes off her omeprazole. She has a sensation of dysphagia with solids or liquids. She has a sour taste in the back of her mouth consistent with water brash. She does have asthma that is also worsened in the morning after lying back. She has been seen numerous times by GI for this and recently had an upper endoscopy with dilatation of a Schatzki's ring. She then had a barium swallow which again confirmed the intrathoracic stomach. She has somewhat consistent nausea after eating but denies any vomiting. This is always made worse when she has a head cold. She denies any fevers, chills, chest pain, shortness of breath, diarrhea, constipation, abdominal pain. She does state that her reflux has been worsening over the last couple of years and would like to do something about this if possible.  HPI  Past Medical History  Diagnosis Date  . Anemia   . Asthma   . GERD (gastroesophageal reflux disease)   . Hyperlipidemia   . Hypertension   . Thyroid cancer ([place]) 1999 and 2015    Past Surgical History  Procedure Laterality Date  . Abdominal hysterectomy  1973    partial  . Thyroid surgery  1999 and 2015    Partial Thyroidectomy  . Esophagogastroduodenoscopy (egd) with propofol N/A 11/16/2015    Procedure: ESOPHAGOGASTRODUODENOSCOPY (EGD) WITH PROPOFOL;  Surgeon: Josefine Class, MD;  Location: [facility] ENDOSCOPY;  Service:  Endoscopy;  Laterality: N/A;    Family History  Problem Relation Age of Onset  . Lung cancer Father   . Hypertension Father   . Bone cancer Sister   . Hypertension Sister   . Lung cancer Brother   . Hypertension Brother   . Kidney cancer Sister   . Hypertension Sister   . Lung cancer Sister   . Hypertension Sister     Social History Social History  Substance Use Topics  . Smoking status: Never Smoker   . Smokeless tobacco: Never Used  . Alcohol Use: No    No Known Allergies  Current Outpatient Prescriptions  Medication Sig Dispense Refill  . albuterol (PROVENTIL HFA;VENTOLIN HFA) 108 (90 Base) MCG/ACT inhaler Inhale into the lungs every 6 (six) hours as needed for wheezing or shortness of breath.    . allopurinol (ZYLOPRIM) 100 MG tablet Take 100 mg by mouth daily.    Marland Kitchen amLODipine (NORVASC) 5 MG tablet Take 1 tablet by mouth.    Marland Kitchen amLODipine (NORVASC) 5 MG tablet 1 tablet.    Marland Kitchen azelastine (ASTELIN) 0.1 % nasal spray Place 1 spray into both nostrils.    . budesonide-formoterol (SYMBICORT) 160-4.5 MCG/ACT inhaler Inhale 2 puffs into the lungs 2 (two) times daily.    . bumetanide (BUMEX) 1 MG tablet Take 1 mg by mouth daily.    . calcium carbonate (CALCIUM 600) 600 MG TABS tablet Take 600 mg by mouth 2 (two) times daily  with a meal.    . Cholecalciferol (VITAMIN D3) 2000 units TABS Take 1 Dose by mouth daily.    . hydrocortisone (CORTEF) 20 MG tablet Take 1 tablet by mouth.    . levothyroxine (SYNTHROID, LEVOTHROID) 50 MCG tablet Take 1 tablet by mouth.    . levothyroxine (SYNTHROID, LEVOTHROID) 50 MCG tablet 1 tablet.    Marland Kitchen lisinopril (PRINIVIL,ZESTRIL) 20 MG tablet Take 20 mg by mouth daily.    Marland Kitchen LORazepam (ATIVAN) 0.5 MG tablet Take 1 tablet by mouth.    Marland Kitchen LORazepam (ATIVAN) 0.5 MG tablet Place 1 tablet under the tongue.    . meclizine (ANTIVERT) 12.5 MG tablet Take 1 tablet by mouth.    . montelukast (SINGULAIR) 10 MG tablet Take 10 mg by mouth at bedtime.    . Omega 3  1000 MG CAPS Place 1 tablet into inhaler and inhale.    . Omega-3 Fatty Acids (OMEGA-3 FISH OIL) 300 MG CAPS Take 1 Dose by mouth daily.    Marland Kitchen omeprazole (PRILOSEC) 40 MG capsule Take 40 mg by mouth daily.    . pantoprazole (PROTONIX) 40 MG tablet Take 1 tablet by mouth.    . Potassium 99 MG TABS Take 1 tablet by mouth daily.    . potassium chloride SA (KLOR-CON M20) 20 MEQ tablet Take 1 tablet by mouth.    . pravastatin (PRAVACHOL) 40 MG tablet Take 40 mg by mouth daily.    . propranolol (INDERAL) 20 MG tablet Take 20 mg by mouth 3 (three) times daily.    . traMADol (ULTRAM) 50 MG tablet Take 50 mg by mouth every 6 (six) hours as needed.     No current facility-administered medications for this visit.     Review of Systems A multi-point review of systems was asked and was negative except for the findings documented in the history of present illness  Physical Exam Blood pressure 145/75, pulse 72, temperature 97.6 F (36.4 C), temperature source Oral, height 5\' 7"  (1.702 m), weight 82.736 kg (182 lb 6.4 oz). CONSTITUTIONAL: No acute distress. EYES: Pupils are equal, round, and reactive to light, Sclera are non-icteric. EARS, NOSE, MOUTH AND THROAT: The oropharynx is clear. The oral mucosa is pink and moist. Hearing is intact to voice. LYMPH NODES:  Lymph nodes in the neck are normal. RESPIRATORY:  Lungs are clear. There is normal respiratory effort, with equal breath sounds bilaterally, and without pathologic use of accessory muscles. CARDIOVASCULAR: Heart is regular without murmurs, gallops, or rubs. GI: The abdomen is soft, nontender, and nondistended. There are no palpable masses. There is no hepatosplenomegaly. There are normal bowel sounds in all quadrants and there are bowel sounds auscultated in the chest consistent with the known hiatal hernia. GU: Rectal deferred.   MUSCULOSKELETAL: Normal muscle strength and tone. No cyanosis or edema.   SKIN: Turgor is good and there are no  pathologic skin lesions or ulcers. NEUROLOGIC: Motor and sensation is grossly normal. Cranial nerves are grossly intact. PSYCH:  Oriented to person, place and time. Affect is normal.  Data Reviewed I reviewed the patient's labs and images. She is slightly anemic with a hemoglobin of 10.4. Her upper endoscopy is reviewed which does show a large hiatal hernia. Her barium swallow is also reviewed which shows a largely intrathoracic stomach as well as a failure of passage of a 13 mm barium tablet. I have personally reviewed the patient's imaging, laboratory findings and medical records.    Assessment     large hiatal  hernia with intrathoracic stomach    Plan    I had a long conversation with the patient about her hiatal hernia and physical location of her stomach. Given all of her symptoms the surgery of a laparoscopic hiatal hernia repair with antireflux surgery was discussed in detail to include the risks, benefits, alternatives. The procedure described as a return of the stomach to the abdominal cavity, closure of the hiatal defect with sutures and mesh, performing an anti-reflux procedure of either a full or partial fundoplication, and an abdominopelvic CT of the gastric wall either with sutures or a PEG tube. Also discussed that the usual postoperative course includes an approximate 3 day hospital stay. Given the length of time that she has had this problem discussed that it is difficult to postulate how long surgery would take as the dissection could be quite difficult for a chronic hiatal hernia.  Her chronic anemia is mild at this point however discussed that this could also increase the risk for surgery. Also discussed that her multiple neck surgeries could make her esophagus more at risk for damage during the surgery but would not preclude her from having surgery. Patient voiced understanding of all this and wishes to proceed with continued workup towards surgery. Provided her with written  instructions for the workup as well as the surgery and expected recovery. She will discuss this with her family prior to her next visit as she will need assistance postoperatively for caring of her self and for her husband and she is the primary care for. Patient will follow up after completing esophageal manometry to discuss possible surgical options.     Time spent with the patient was 60 minutes, with more than 50% of the time spent in face-to-face education, counseling and care coordination.     Clayburn Pert, MD FACS General Surgeon 12/11/2015, 9:59 AM

## 2015-12-11 NOTE — Patient Instructions (Signed)
You have been scheduled for your Esophageal Manometry Test on 01/13/16 at [facility].  You will then follow-up in the office with Dr. Adonis Huguenin on 01/28/16 in the [facility] office. The address for our [place] is: [address], [place], [place] [address]. This is located right across from the [employer] in the [place]    Laparoscopic Nissen Fundoplication Laparoscopic Nissen fundoplication is surgery to relieve heartburn and other problems caused by gastric fluids flowing up into your esophagus. The esophagus is the tube that carries food and liquid from your throat to your stomach. Normally, the muscle that sits between your stomach and your esophagus (lower esophageal sphincter or LES) keeps stomach fluids in your stomach. In some people, the LES does not work properly, and stomach fluids flow up into the esophagus. This can happen when part of the stomach bulges through the LES (hiatal hernia). The backward flow of stomach fluids can cause a type of severe and long-standing heartburn that is called gastroesophageal reflux disease (GERD). You may need this surgery if other treatments for GERD have not helped. In a laparoscopic Nissen fundoplication, the upper part of your stomach is wrapped around the lower part of your esophagus to strengthen the LES and prevent reflux. If you have a hiatal hernia, it will also be repaired with this surgery. The procedure is done through several small incisions in your abdomen. It is performed using a thin, telescopic instrument (laparoscope) and other instruments that can pass through the scope or through other small incisions. LET [facility] CARE PROVIDER KNOW ABOUT:  Any allergies you have.  All medicines you are taking, including vitamins, herbs, eye drops, creams, and over-the-counter medicines.  Previous problems you or members of your family have had with the use of anesthetics.  Any blood disorders you have.  Previous  surgeries you have had.  Medical conditions you have. RISKS AND COMPLICATIONS Generally, this is a safe procedure. However, problems may occur, including:  Difficulty swallowing (dysphagia).  Bloating.  Nausea or vomiting.  Damage to the lung, causing a collapsed lung.  Infection or bleeding. BEFORE THE PROCEDURE  Ask your health care provider about:  Changing or stopping your regular medicines. This is especially important if you are taking diabetes medicines or blood thinners.  Taking medicines such as aspirin and ibuprofen. These medicines can thin your blood. Do not take these medicines before your procedure if your health care provider asks you not to.  Follow your health care provider's instructions about eating or drinking restrictions.  Plan to have someone take you home after the procedure. PROCEDURE  An IV tube will be inserted into one of your veins. It will be used to give you fluids and medicines during the procedure.  You will be given a medicine that makes you fall asleep (general anesthetic).  Your abdomen will be cleaned with a germ-killing solution (antiseptic).  The surgeon will make a small incision in your abdomen and insert a tube through the incision.  Your abdomen will be filled with a gas. This helps the surgeon to see your organs more easily and it makes more space to work.  The surgeon will insert the laparoscope through the incision. The scope has a camera that will send pictures to a monitor in the operating room.  The surgeon will make several other small incisions in your abdomen to insert the other instruments that are needed during the procedure.  Another instrument (dilator) will be passed through  your mouth and down your esophagus into the upper part of your stomach. The dilator will prevent your LES from being closed too tightly during surgery.  The surgeon will pass the top portion of your stomach behind the lower part of your esophagus  and wrap it all the way around. This will be stitched into place.  If you have a hiatal hernia, it will be repaired during this procedure.  All instruments will be removed, and your incisions will be closed under your skin with stitches (sutures). Skin adhesive strips may also be used.  A bandage (dressing) will be placed on your skin over the incisions. The procedure may vary among health care providers and hospitals. AFTER THE PROCEDURE  You will be moved to a recovery area.  Your blood pressure, heart rate, breathing rate, and blood oxygen level will be monitored often until the medicines you were given have worn off.  You will be given pain medicine as needed.  Your IV tube will be kept in until you are able to drink fluids.   This information is not intended to replace advice given to you by your health care provider. Make sure you discuss any questions you have with your health care provider.  Laparoscopic Nissen Fundoplication, Care After Refer to this sheet in the next few weeks. These instructions provide you with information about caring for yourself after your procedure. Your health care provider may also give you more specific instructions. Your treatment has been planned according to current medical practices, but problems sometimes occur. Call your health care provider if you have any problems or questions after your procedure. WHAT TO EXPECT AFTER THE PROCEDURE After your procedure, it is common to have:   Difficulty swallowing (dysphagia).  Excess gas (bloating). HOME CARE INSTRUCTIONS Medicines  Take medicines only as directed by your health care provider.  Do not drive or operate heavy machinery while taking pain medicine. Incision Care  There are many different ways to close and cover an incision, including stitches (sutures), skin glue, and adhesive strips. Follow your health care provider's instructions about:  Incision care.  Bandage (dressing) changes  and removal.  Incision closure removal.  Check your incision areas every day for signs of infection. Watch for:  Redness, swelling, or pain.  Fluid, blood, or pus.  Do not take baths, swim, or use a hot tub until your health care provider approves. Take showers as directed by your health care provider. Eating and Drinking  Follow your health care provider's instructions about eating.  You may need to follow a liquid-only diet for 2 weeks, followed by a diet of soft foods for 2 weeks.  You should return to your usual diet gradually.  Drink enough fluid to keep your urine clear or pale yellow. Activities  Return to your normal activities as directed by your health care provider. Ask your health care provider what activities are safe for you.  Avoid strenuous exercise.  Do not lift anything that is heavier than 10 lb (4.5 kg).  Ask your health care provider when you can:  Return to sexual activity.  Drive.  Go back to work. SEEK MEDICAL CARE IF:  You have a fever.  Your pain gets worse or is not helped by medicine.  You have frequent nausea or vomiting.  You have continued abdominal bloating.  You have an ongoing (persistent) cough.  You have redness, swelling, or pain in any incision areas.  You have fluid, blood, or pus coming from  any incisions. SEEK IMMEDIATE MEDICAL CARE IF:  You have trouble breathing.  You are unable to swallow.  You have persistent vomiting.  You have blood in your vomit.  You have severe abdominal pain.   This information is not intended to replace advice given to you by your health care provider. Make sure you discuss any questions you have with your health care provider.   Document Released: 06/02/2004 Document Revised: 10/31/2014 Document Reviewed: 06/11/2014 Elsevier Interactive Patient Education [employer].

## 2015-12-23 ENCOUNTER — Inpatient Hospital Stay: Payer: Medicare Other | Attending: Oncology

## 2015-12-23 DIAGNOSIS — D539 Nutritional anemia, unspecified: Secondary | ICD-10-CM

## 2015-12-23 DIAGNOSIS — D649 Anemia, unspecified: Secondary | ICD-10-CM | POA: Diagnosis present

## 2015-12-23 LAB — CBC WITH DIFFERENTIAL/PLATELET
BASOS ABS: 0.1 10*3/uL (ref 0–0.1)
BASOS PCT: 1 %
Eosinophils Absolute: 0.2 10*3/uL (ref 0–0.7)
Eosinophils Relative: 3 %
HEMATOCRIT: 33.2 % — AB (ref 35.0–47.0)
Hemoglobin: 11 g/dL — ABNORMAL LOW (ref 12.0–16.0)
Lymphocytes Relative: 15 %
Lymphs Abs: 0.7 10*3/uL — ABNORMAL LOW (ref 1.0–3.6)
MCH: 29.7 pg (ref 26.0–34.0)
MCHC: 33.1 g/dL (ref 32.0–36.0)
MCV: 89.6 fL (ref 80.0–100.0)
MONO ABS: 0.4 10*3/uL (ref 0.2–0.9)
Monocytes Relative: 9 %
NEUTROS ABS: 3.5 10*3/uL (ref 1.4–6.5)
Neutrophils Relative %: 72 %
PLATELETS: 355 10*3/uL (ref 150–440)
RBC: 3.7 MIL/uL — ABNORMAL LOW (ref 3.80–5.20)
RDW: 15.2 % — AB (ref 11.5–14.5)
WBC: 4.9 10*3/uL (ref 3.6–11.0)

## 2016-01-11 ENCOUNTER — Telehealth: Payer: Self-pay | Admitting: General Surgery

## 2016-01-11 NOTE — Telephone Encounter (Signed)
Returned phone call to patient. Asked her to call back when she is feeling better and we will get this rescheduled. She verbalizes understanding.

## 2016-01-11 NOTE — Telephone Encounter (Signed)
Patient has called and stated that she has pneumonia and would like to cancel her appointment for Esophageal Manometry Test on 01/13/16 at [facility]. Please contact patient to discuss when she can have this done.

## 2016-01-13 ENCOUNTER — Encounter: Admission: RE | Payer: Self-pay | Source: Ambulatory Visit

## 2016-01-13 ENCOUNTER — Ambulatory Visit: Admission: RE | Admit: 2016-01-13 | Payer: Medicare Other | Source: Ambulatory Visit | Admitting: Gastroenterology

## 2016-01-13 SURGERY — MONITORING, ESOPHAGEAL PH, 24 HOUR

## 2016-01-18 ENCOUNTER — Inpatient Hospital Stay: Payer: Medicare Other

## 2016-01-18 DIAGNOSIS — D649 Anemia, unspecified: Secondary | ICD-10-CM | POA: Diagnosis not present

## 2016-01-18 DIAGNOSIS — D539 Nutritional anemia, unspecified: Secondary | ICD-10-CM

## 2016-01-18 LAB — CBC WITH DIFFERENTIAL/PLATELET
BASOS ABS: 0 10*3/uL (ref 0–0.1)
BASOS PCT: 1 %
EOS PCT: 3 %
Eosinophils Absolute: 0.2 10*3/uL (ref 0–0.7)
HCT: 30.9 % — ABNORMAL LOW (ref 35.0–47.0)
Hemoglobin: 10.6 g/dL — ABNORMAL LOW (ref 12.0–16.0)
Lymphocytes Relative: 11 %
Lymphs Abs: 0.6 10*3/uL — ABNORMAL LOW (ref 1.0–3.6)
MCH: 30.3 pg (ref 26.0–34.0)
MCHC: 34.2 g/dL (ref 32.0–36.0)
MCV: 88.6 fL (ref 80.0–100.0)
MONO ABS: 0.6 10*3/uL (ref 0.2–0.9)
Monocytes Relative: 10 %
Neutro Abs: 4.4 10*3/uL (ref 1.4–6.5)
Neutrophils Relative %: 75 %
PLATELETS: 342 10*3/uL (ref 150–440)
RBC: 3.48 MIL/uL — ABNORMAL LOW (ref 3.80–5.20)
RDW: 16.2 % — AB (ref 11.5–14.5)
WBC: 5.8 10*3/uL (ref 3.6–11.0)

## 2016-01-22 ENCOUNTER — Other Ambulatory Visit: Payer: Self-pay

## 2016-01-28 ENCOUNTER — Ambulatory Visit: Payer: Self-pay | Admitting: General Surgery

## 2016-02-22 ENCOUNTER — Inpatient Hospital Stay: Payer: Medicare Other | Admitting: Family Medicine

## 2016-02-22 ENCOUNTER — Inpatient Hospital Stay: Payer: Medicare Other

## 2016-02-22 ENCOUNTER — Ambulatory Visit: Payer: Self-pay

## 2016-02-26 ENCOUNTER — Inpatient Hospital Stay: Payer: Medicare Other | Attending: Family Medicine

## 2016-02-26 ENCOUNTER — Inpatient Hospital Stay (HOSPITAL_BASED_OUTPATIENT_CLINIC_OR_DEPARTMENT_OTHER): Payer: Medicare Other | Admitting: Family Medicine

## 2016-02-26 VITALS — BP 146/77 | HR 80 | Temp 96.8°F | Resp 18 | Ht 67.0 in | Wt 190.9 lb

## 2016-02-26 DIAGNOSIS — D649 Anemia, unspecified: Secondary | ICD-10-CM | POA: Diagnosis present

## 2016-02-26 DIAGNOSIS — E785 Hyperlipidemia, unspecified: Secondary | ICD-10-CM

## 2016-02-26 DIAGNOSIS — J45909 Unspecified asthma, uncomplicated: Secondary | ICD-10-CM

## 2016-02-26 DIAGNOSIS — I10 Essential (primary) hypertension: Secondary | ICD-10-CM

## 2016-02-26 DIAGNOSIS — Z801 Family history of malignant neoplasm of trachea, bronchus and lung: Secondary | ICD-10-CM

## 2016-02-26 DIAGNOSIS — Z8051 Family history of malignant neoplasm of kidney: Secondary | ICD-10-CM | POA: Diagnosis not present

## 2016-02-26 DIAGNOSIS — K219 Gastro-esophageal reflux disease without esophagitis: Secondary | ICD-10-CM | POA: Diagnosis not present

## 2016-02-26 DIAGNOSIS — Z85850 Personal history of malignant neoplasm of thyroid: Secondary | ICD-10-CM | POA: Diagnosis not present

## 2016-02-26 DIAGNOSIS — D6489 Other specified anemias: Secondary | ICD-10-CM

## 2016-02-26 DIAGNOSIS — R5383 Other fatigue: Secondary | ICD-10-CM

## 2016-02-26 DIAGNOSIS — Z808 Family history of malignant neoplasm of other organs or systems: Secondary | ICD-10-CM | POA: Diagnosis not present

## 2016-02-26 DIAGNOSIS — Z79899 Other long term (current) drug therapy: Secondary | ICD-10-CM | POA: Insufficient documentation

## 2016-02-26 DIAGNOSIS — R531 Weakness: Secondary | ICD-10-CM | POA: Diagnosis not present

## 2016-02-26 DIAGNOSIS — D539 Nutritional anemia, unspecified: Secondary | ICD-10-CM

## 2016-02-26 LAB — CBC WITH DIFFERENTIAL/PLATELET
BASOS ABS: 0 10*3/uL (ref 0–0.1)
BASOS PCT: 1 %
Eosinophils Absolute: 0.1 10*3/uL (ref 0–0.7)
Eosinophils Relative: 3 %
HEMATOCRIT: 30.9 % — AB (ref 35.0–47.0)
HEMOGLOBIN: 10.5 g/dL — AB (ref 12.0–16.0)
Lymphocytes Relative: 15 %
Lymphs Abs: 0.6 10*3/uL — ABNORMAL LOW (ref 1.0–3.6)
MCH: 31 pg (ref 26.0–34.0)
MCHC: 34 g/dL (ref 32.0–36.0)
MCV: 91 fL (ref 80.0–100.0)
MONOS PCT: 8 %
Monocytes Absolute: 0.3 10*3/uL (ref 0.2–0.9)
NEUTROS ABS: 2.8 10*3/uL (ref 1.4–6.5)
NEUTROS PCT: 73 %
Platelets: 323 10*3/uL (ref 150–440)
RBC: 3.39 MIL/uL — ABNORMAL LOW (ref 3.80–5.20)
RDW: 18.3 % — ABNORMAL HIGH (ref 11.5–14.5)
WBC: 4 10*3/uL (ref 3.6–11.0)

## 2016-02-26 LAB — COMPREHENSIVE METABOLIC PANEL
ALBUMIN: 4.3 g/dL (ref 3.5–5.0)
ALK PHOS: 78 U/L (ref 38–126)
ALT: 8 U/L — ABNORMAL LOW (ref 14–54)
AST: 21 U/L (ref 15–41)
Anion gap: 8 (ref 5–15)
BILIRUBIN TOTAL: 0.6 mg/dL (ref 0.3–1.2)
BUN: 18 mg/dL (ref 6–20)
CALCIUM: 9.1 mg/dL (ref 8.9–10.3)
CHLORIDE: 104 mmol/L (ref 101–111)
CO2: 25 mmol/L (ref 22–32)
CREATININE: 1.2 mg/dL — AB (ref 0.44–1.00)
GFR calc Af Amer: 50 mL/min — ABNORMAL LOW (ref >60)
GFR calc non Af Amer: 43 mL/min — ABNORMAL LOW (ref >60)
GLUCOSE: 97 mg/dL (ref 65–99)
Potassium: 3.6 mmol/L (ref 3.5–5.1)
Sodium: 137 mmol/L (ref 135–145)
Total Protein: 7.8 g/dL (ref 6.5–8.1)

## 2016-02-26 NOTE — Progress Notes (Signed)
[facility]  Telephone:([phone]) [phone]  Fax:([phone]) [phone]     Sara Fields DOB: 03-26-1941  MR#: [id number]  RCV#:[id number]  Patient Care Team: Adin Hector, MD as PCP - General (Internal Medicine)  CHIEF COMPLAINT:  Chief Complaint  Patient presents with  . Follow-up    Macrocytic anemia  She has a history of anemia in the past, hemoglobin reportedly dropped to 4 g/dL which required blood transfusion. She was treated with radioactive iodine for recurrent thyroid cancer in 1999 in 2015, but she claims that the last iodine infusion was performed in July 2016. She was seen by her primary care physician, who detected a significant anemia in December 2016. The workup showed normal vitamin B12, folate, TSH.   INTERVAL HISTORY: Patient is here for continued evaluation regarding anemia. Patient reports having some mild fatigue but no more than his usual. She denies any acute blood loss, chest pain, dizziness, shortness of breath. She does report that she thinks fatigue is related to caring for her husband who has dementia. Otherwise she feels very well and denies any acute complaints.  REVIEW OF SYSTEMS:   Review of Systems  Constitutional: Positive for malaise/fatigue. Negative for fever, chills, weight loss and diaphoresis.  HENT: Negative.   Eyes: Negative.   Respiratory: Negative for cough, hemoptysis, sputum production, shortness of breath and wheezing.   Cardiovascular: Negative for chest pain, palpitations, orthopnea, claudication, leg swelling and PND.  Gastrointestinal: Negative for heartburn, nausea, vomiting, abdominal pain, diarrhea, constipation, blood in stool and melena.  Genitourinary: Negative.   Musculoskeletal: Negative.   Skin: Negative.   Neurological: Negative for dizziness, tingling, focal weakness, seizures and weakness.  Endo/Heme/Allergies: Does not bruise/bleed easily.  Psychiatric/Behavioral: Negative for depression. The patient is not  nervous/anxious and does not have insomnia.     As per HPI. Otherwise, a complete review of systems is negatve.   PAST MEDICAL HISTORY: Past Medical History  Diagnosis Date  . Anemia   . Asthma   . GERD (gastroesophageal reflux disease)   . Hyperlipidemia   . Hypertension   . Thyroid cancer ([place]) 1999 and 2015    PAST SURGICAL HISTORY: Past Surgical History  Procedure Laterality Date  . Abdominal hysterectomy  1973    partial  . Thyroid surgery  1999 and 2015    Partial Thyroidectomy  . Esophagogastroduodenoscopy (egd) with propofol N/A 11/16/2015    Procedure: ESOPHAGOGASTRODUODENOSCOPY (EGD) WITH PROPOFOL;  Surgeon: Josefine Class, MD;  Location: [facility] ENDOSCOPY;  Service: Endoscopy;  Laterality: N/A;    FAMILY HISTORY Family History  Problem Relation Age of Onset  . Lung cancer Father   . Hypertension Father   . Bone cancer Sister   . Hypertension Sister   . Lung cancer Brother   . Hypertension Brother   . Kidney cancer Sister   . Hypertension Sister   . Lung cancer Sister   . Hypertension Sister     GYNECOLOGIC HISTORY:  No LMP recorded. Patient is postmenopausal.     ADVANCED DIRECTIVES:    HEALTH MAINTENANCE: Social History  Substance Use Topics  . Smoking status: Never Smoker   . Smokeless tobacco: Never Used  . Alcohol Use: No       No Known Allergies  Current Outpatient Prescriptions  Medication Sig Dispense Refill  . albuterol (PROVENTIL HFA;VENTOLIN HFA) 108 (90 Base) MCG/ACT inhaler Inhale into the lungs every 6 (six) hours as needed for wheezing or shortness of breath.    . allopurinol (ZYLOPRIM)  100 MG tablet Take 100 mg by mouth daily.    Marland Kitchen amLODipine (NORVASC) 5 MG tablet Take 1 tablet by mouth.    Marland Kitchen azelastine (ASTELIN) 0.1 % nasal spray Place 1 spray into both nostrils.    . budesonide-formoterol (SYMBICORT) 160-4.5 MCG/ACT inhaler Inhale 2 puffs into the lungs 2 (two) times daily.    . bumetanide (BUMEX) 1 MG tablet Take 1 mg  by mouth daily.    . calcium carbonate (CALCIUM 600) 600 MG TABS tablet Take 600 mg by mouth 2 (two) times daily with a meal.    . Cholecalciferol (VITAMIN D3) 2000 units TABS Take 1 Dose by mouth daily.    . hydrocortisone (CORTEF) 20 MG tablet Take 1 tablet by mouth.    . levothyroxine (SYNTHROID, LEVOTHROID) 50 MCG tablet Take 1 tablet by mouth.    Marland Kitchen lisinopril (PRINIVIL,ZESTRIL) 20 MG tablet Take 20 mg by mouth daily.    Marland Kitchen LORazepam (ATIVAN) 0.5 MG tablet Take 1 tablet by mouth.    . meclizine (ANTIVERT) 12.5 MG tablet Take 1 tablet by mouth.    . montelukast (SINGULAIR) 10 MG tablet Take 10 mg by mouth at bedtime.    . Omega 3 1000 MG CAPS Place 1 tablet into inhaler and inhale.    . Omega-3 Fatty Acids (OMEGA-3 FISH OIL) 300 MG CAPS Take 1 Dose by mouth daily.    Marland Kitchen omeprazole (PRILOSEC) 40 MG capsule Take 40 mg by mouth daily.    . pantoprazole (PROTONIX) 40 MG tablet Take 1 tablet by mouth.    . potassium chloride SA (KLOR-CON M20) 20 MEQ tablet Take 1 tablet by mouth.    . pravastatin (PRAVACHOL) 40 MG tablet Take 40 mg by mouth daily.    . propranolol (INDERAL) 20 MG tablet Take 20 mg by mouth 3 (three) times daily.    . traMADol (ULTRAM) 50 MG tablet Take 50 mg by mouth every 6 (six) hours as needed.     No current facility-administered medications for this visit.    OBJECTIVE: BP 146/77 mmHg  Pulse 80  Temp(Src) 96.8 F (36 C) (Tympanic)  Resp 18  Ht _0  (1.702 m)  Wt 190 lb 14.7 oz (86.6 kg)  BMI 29.90 kg/m2   Body mass index is 29.9 kg/(m^2).    ECOG FS:1 - Symptomatic but completely ambulatory  General: Well-developed, well-nourished, no acute distress. Eyes: Pink conjunctiva, anicteric sclera. HEENT: Normocephalic, moist mucous membranes, clear oropharnyx. Lungs: Clear to auscultation bilaterally. Heart: Regular rate and rhythm. No rubs, murmurs, or gallops. Abdomen: Soft, nontender, nondistended. No organomegaly noted, normoactive bowel sounds. Musculoskeletal:  No edema, cyanosis, or clubbing. Neuro: Alert, answering all questions appropriately. Cranial nerves grossly intact. Skin: No rashes or petechiae noted. Psych: Normal affect.   LAB RESULTS:  Appointment on 02/26/2016  Component Date Value Ref Range Status  . WBC 02/26/2016 4.0  3.6 - 11.0 K/uL Final  . RBC 02/26/2016 3.39* 3.80 - 5.20 MIL/uL Final  . Hemoglobin 02/26/2016 10.5* 12.0 - 16.0 g/dL Final  . HCT 02/26/2016 30.9* 35.0 - 47.0 % Final  . MCV 02/26/2016 91.0  80.0 - 100.0 fL Final  . MCH 02/26/2016 31.0  26.0 - 34.0 pg Final  . MCHC 02/26/2016 34.0  32.0 - 36.0 g/dL Final  . RDW 02/26/2016 18.3* 11.5 - 14.5 % Final  . Platelets 02/26/2016 323  150 - 440 K/uL Final  . Neutrophils Relative % 02/26/2016 73   Final  . Neutro Abs 02/26/2016 2.8  1.4 - 6.5 K/uL Final  . Lymphocytes Relative 02/26/2016 15   Final  . Lymphs Abs 02/26/2016 0.6* 1.0 - 3.6 K/uL Final  . Monocytes Relative 02/26/2016 8   Final  . Monocytes Absolute 02/26/2016 0.3  0.2 - 0.9 K/uL Final  . Eosinophils Relative 02/26/2016 3   Final  . Eosinophils Absolute 02/26/2016 0.1  0 - 0.7 K/uL Final  . Basophils Relative 02/26/2016 1   Final  . Basophils Absolute 02/26/2016 0.0  0 - 0.1 K/uL Final  . Sodium 02/26/2016 137  135 - 145 mmol/L Final  . Potassium 02/26/2016 3.6  3.5 - 5.1 mmol/L Final  . Chloride 02/26/2016 104  101 - 111 mmol/L Final  . CO2 02/26/2016 25  22 - 32 mmol/L Final  . Glucose, Bld 02/26/2016 97  65 - 99 mg/dL Final  . BUN 02/26/2016 18  6 - 20 mg/dL Final  . Creatinine, Ser 02/26/2016 1.20* 0.44 - 1.00 mg/dL Final  . Calcium 02/26/2016 9.1  8.9 - 10.3 mg/dL Final  . Total Protein 02/26/2016 7.8  6.5 - 8.1 g/dL Final  . Albumin 02/26/2016 4.3  3.5 - 5.0 g/dL Final  . AST 02/26/2016 21  15 - 41 U/L Final  . ALT 02/26/2016 8* 14 - 54 U/L Final  . Alkaline Phosphatase 02/26/2016 78  38 - 126 U/L Final  . Total Bilirubin 02/26/2016 0.6  0.3 - 1.2 mg/dL Final  . GFR calc non Af Amer  02/26/2016 43* >60 mL/min Final  . GFR calc Af Amer 02/26/2016 50* >60 mL/min Final   Comment: (NOTE) The eGFR has been calculated using the CKD EPI equation. This calculation has not been validated in all clinical situations. eGFR's persistently <60 mL/min signify possible Chronic Kidney Disease.   . Anion gap 02/26/2016 8  5 - 15 Final    STUDIES: No results found.  ASSESSMENT:  Anemia.  PLAN:   1. Anemia. Previous workup failed to reveal any evidence of iron or vitamin deficiency, hypersplenism or red blood cell destruction. With patient significant past medical history for radioactive iodine treatment is possible that this is the cause of her anemia but concern for myelodysplastic syndrome still exists. Patient was previously offered bone marrow biopsy for further investigation that she is not currently interested in proceeding with this route. We will continue with routine evaluation of lab work  every 4 weeks with next follow-up in 12 weeks.   Patient expressed understanding and was in agreement with this plan. She also understands that She can call clinic at any time with any questions, concerns, or complaints.   Dr. Rogue Bussing was available for consultation and review of plan of care for this patient.   Evlyn Kanner, NP   02/26/2016 11:14 AM

## 2016-02-26 NOTE — Progress Notes (Signed)
Pt report no changes since last visit.  Mild fatigue as always with no other concerns noted

## 2016-02-28 IMAGING — RF DG ESOPHAGUS
13 series · 13 of 13 positions shown · non-contrast
Comparison: None in PACs

CLINICAL DATA: Difficulty swallowing solid food, underwent
esophageal dilation 2-3 weeks ago, history of thyroid malignancy
with surgery.

EXAM:
ESOPHOGRAM / BARIUM SWALLOW / BARIUM TABLET STUDY
TECHNIQUE: Combined double contrast and single contrast examination performed
using effervescent crystals, thick barium liquid, and thin barium
liquid. The patient was observed with fluoroscopy swallowing a 13 mm
barium sulphate tablet.
FLUOROSCOPY TIME:  Fluoroscopy Time:  1 minutes, 36 seconds
Number of Acquired Images:  13

[Series 1: fluoro_barium 2fps_bw · 0.17mm/px · 1 of 1 slices shown (1 of 13)]
[im 1/1]
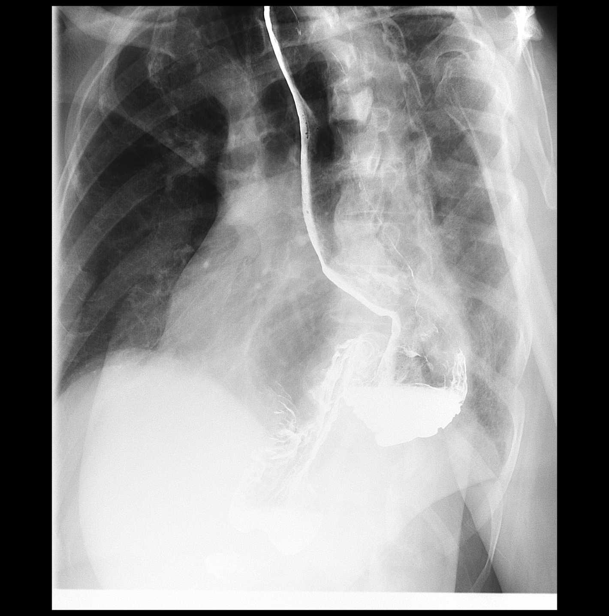

[Series 2: fluoro_barium 2fps_bw · 0.17mm/px · 1 of 1 slices shown (2 of 13)]
[im 1/1]
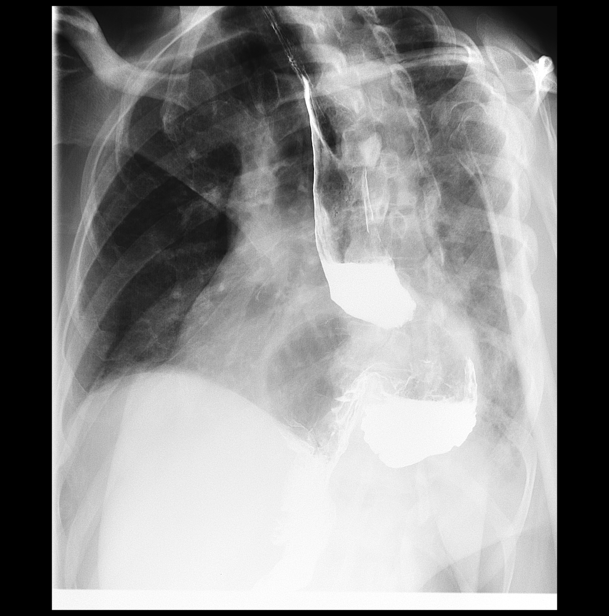

[Series 3: fluoro_barium 2fps_bw · 0.18mm/px · 1 of 1 slices shown (3 of 13)]
[im 1/1]
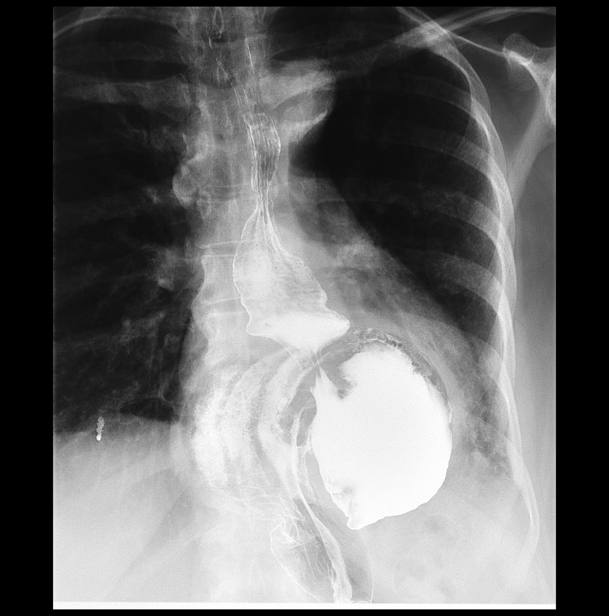

[Series 4: fluoro_barium 2fps_bw · 0.18mm/px · 1 of 1 slices shown (4 of 13)]
[im 1/1]
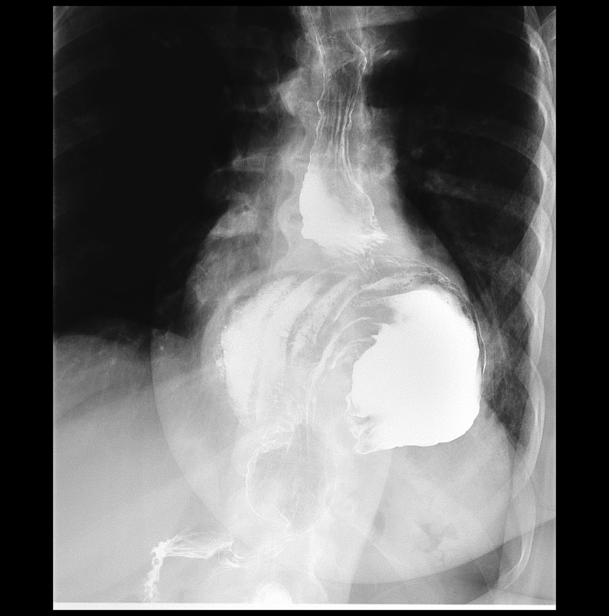

[Series 5: fluoro_barium 2fps_bw · 0.18mm/px · 1 of 1 slices shown (5 of 13)]
[im 1/1]
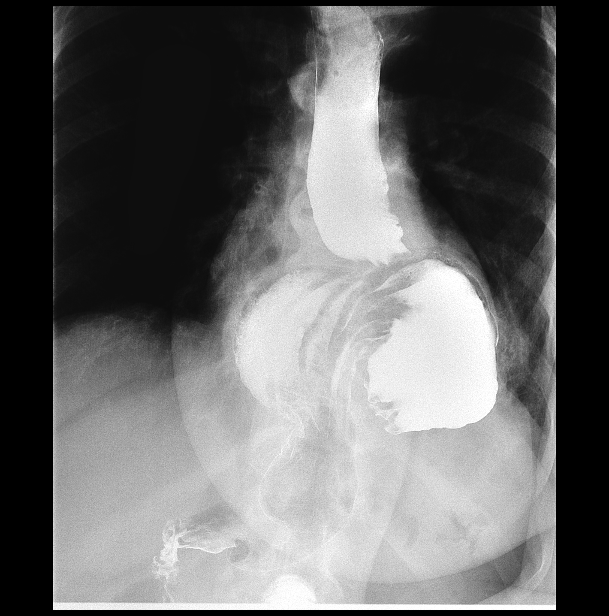

[Series 6: fluoro_barium 2fps_bw · 0.18mm/px · 1 of 1 slices shown (6 of 13)]
[im 1/1]
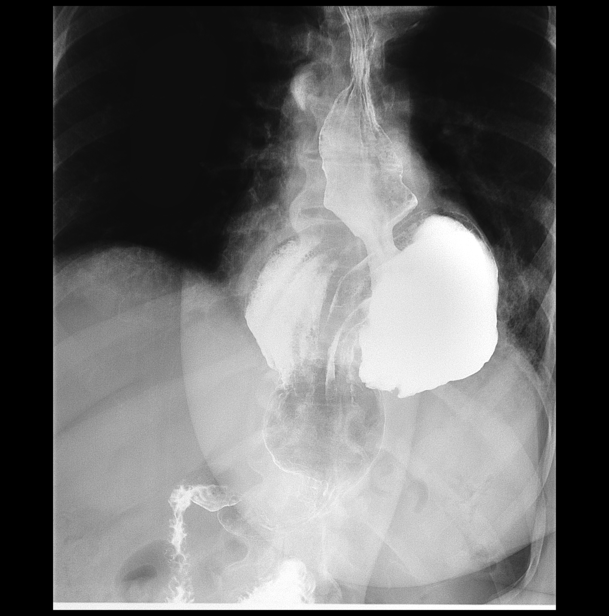

[Series 7: fluoro_barium 2fps_bw · 0.18mm/px · 1 of 1 slices shown (7 of 13)]
[im 1/1]
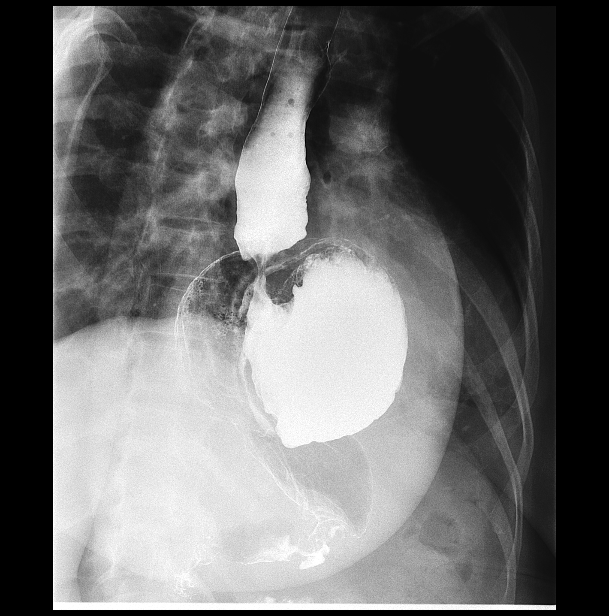

[Series 8: fluoro_barium 2fps_bw · 0.18mm/px · 1 of 1 slices shown (8 of 13)]
[im 1/1]
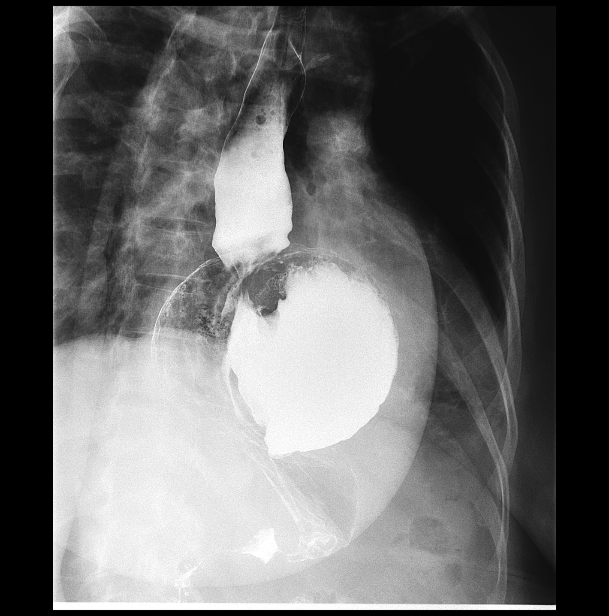

[Series 9: fluoro_barium 2fps_bw · 0.18mm/px · 1 of 1 slices shown (9 of 13)]
[im 1/1]
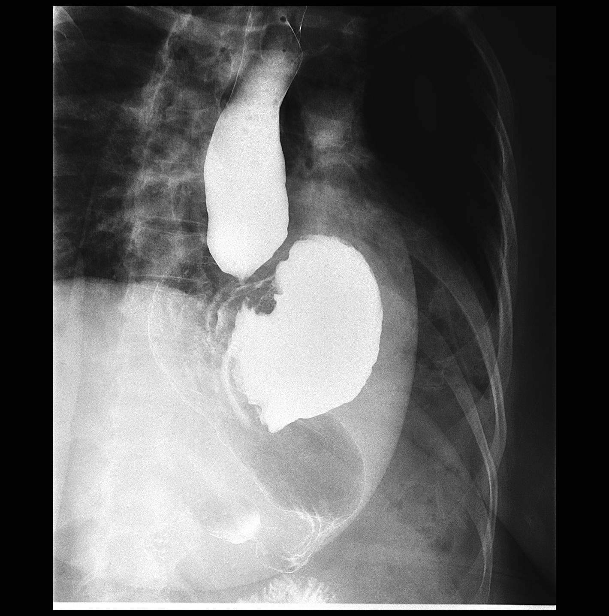

[Series 10: fluoro_barium 2fps_bw · 0.18mm/px · 1 of 1 slices shown (10 of 13)]
[im 1/1]
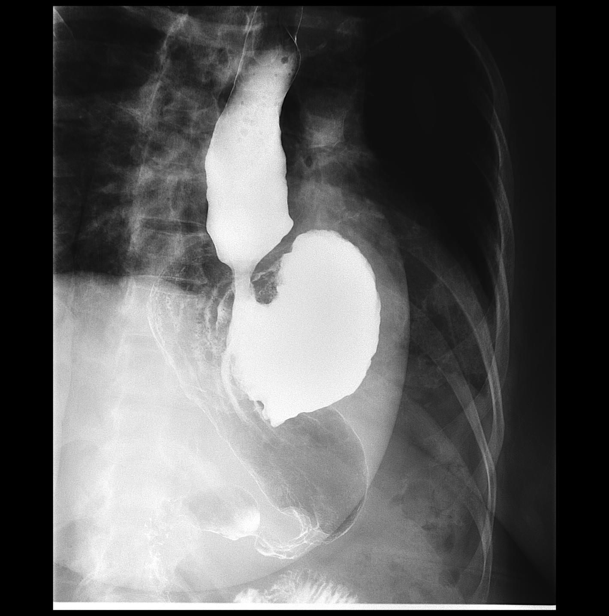

[Series 11: fluoro_barium 2fps_bw · 0.18mm/px · 1 of 1 slices shown (11 of 13)]
[im 1/1]
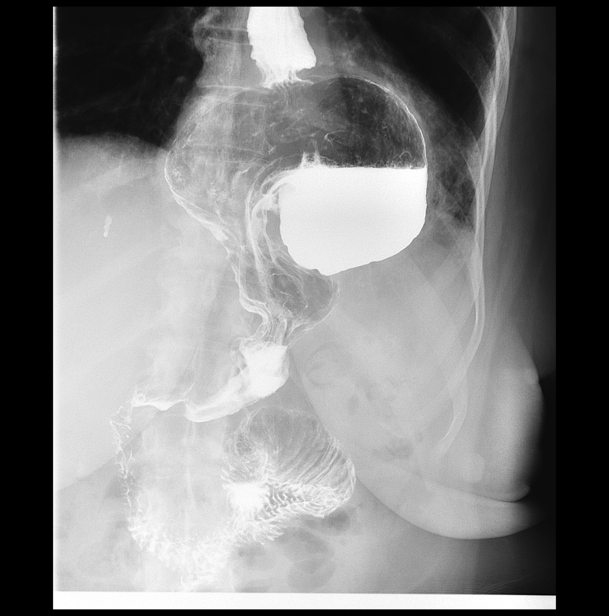

[Series 12: fluoro_barium 2fps_bw · 0.17mm/px · 1 of 1 slices shown (12 of 13)]
[im 1/1]
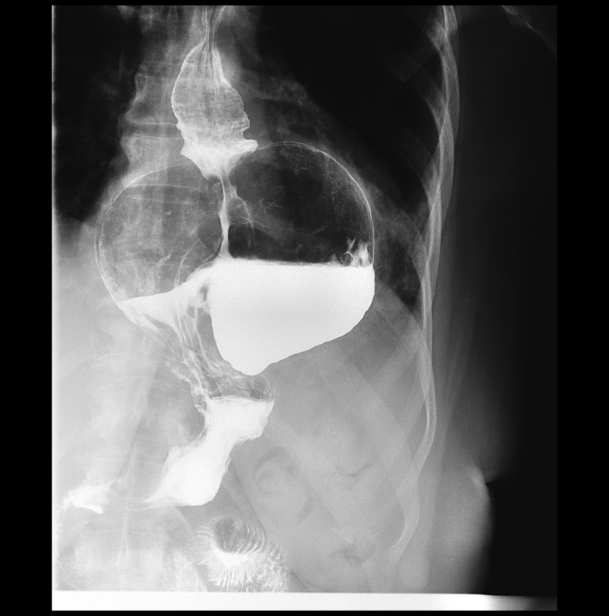

[Series 13: fluoro_barium 2fps_bw · 0.17mm/px · 1 of 1 slices shown (13 of 13)]
[im 1/1]
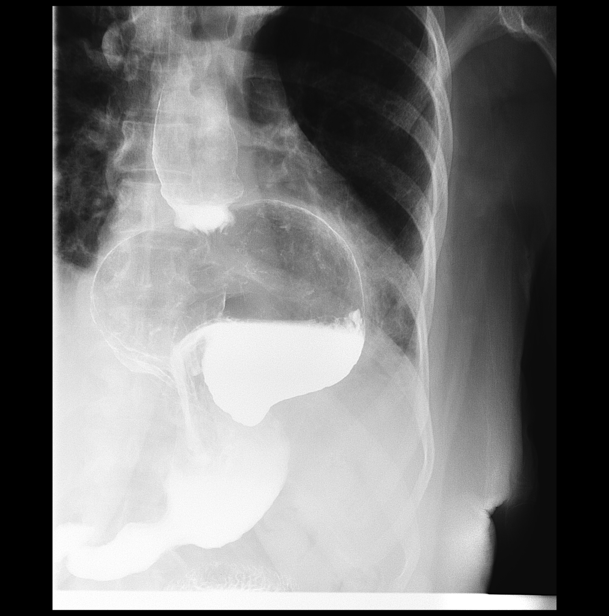

[13 of 13 positions shown; findings below may reference images not displayed]

FINDINGS: The patient ingested the thick and thin barium and gas forming
crystals without difficulty. The cervical esophagus distended well.
The thoracic esophagus distended well down to the GE junction. A few
tertiary contractions in the distal third of the esophagus were
observed. At the GE junction there was an area of fairly fixed
narrowing which allowed passage of intermittent boluses of barium
but would not admit the 13 mm barium tablet. The stomach was largely
intrathoracic there is no paraesophageal hernia. The intra-abdominal
portion of the stomach as well as the duodenal bulb and C-sweep
appeared normal. No ulcer niche was observed.
IMPRESSION: Largely intrathoracic stomach. The GE junction (which is of course
above the hemidiaphragm) is narrowed and will not allow passage of
the 13 mm barium tablet. There are mild changes of presbyesophagus
in the lower third of the esophagus.

Direct visualization and consideration for repeat dilation of the GE
junction is recommended. In addition surgical consultation for
consideration for repositioning of the largely intra thoracic
stomach could be considered.

## 2016-03-25 ENCOUNTER — Inpatient Hospital Stay: Payer: Medicare Other | Attending: Family Medicine

## 2016-03-25 DIAGNOSIS — D6489 Other specified anemias: Secondary | ICD-10-CM

## 2016-03-25 DIAGNOSIS — D539 Nutritional anemia, unspecified: Secondary | ICD-10-CM | POA: Insufficient documentation

## 2016-03-25 LAB — CBC WITH DIFFERENTIAL/PLATELET
BASOS ABS: 0.1 10*3/uL (ref 0–0.1)
Basophils Relative: 1 %
Eosinophils Absolute: 0.2 10*3/uL (ref 0–0.7)
Eosinophils Relative: 3 %
HEMATOCRIT: 31.8 % — AB (ref 35.0–47.0)
HEMOGLOBIN: 11 g/dL — AB (ref 12.0–16.0)
LYMPHS PCT: 12 %
Lymphs Abs: 0.6 10*3/uL — ABNORMAL LOW (ref 1.0–3.6)
MCH: 31.7 pg (ref 26.0–34.0)
MCHC: 34.6 g/dL (ref 32.0–36.0)
MCV: 91.5 fL (ref 80.0–100.0)
MONO ABS: 0.4 10*3/uL (ref 0.2–0.9)
MONOS PCT: 8 %
NEUTROS ABS: 3.8 10*3/uL (ref 1.4–6.5)
NEUTROS PCT: 76 %
Platelets: 328 10*3/uL (ref 150–440)
RBC: 3.47 MIL/uL — ABNORMAL LOW (ref 3.80–5.20)
RDW: 16.1 % — AB (ref 11.5–14.5)
WBC: 5 10*3/uL (ref 3.6–11.0)

## 2016-04-13 ENCOUNTER — Other Ambulatory Visit: Payer: Self-pay | Admitting: Internal Medicine

## 2016-04-13 DIAGNOSIS — Z85850 Personal history of malignant neoplasm of thyroid: Secondary | ICD-10-CM

## 2016-04-13 DIAGNOSIS — R1310 Dysphagia, unspecified: Secondary | ICD-10-CM

## 2016-04-22 ENCOUNTER — Inpatient Hospital Stay: Payer: Medicare Other

## 2016-04-22 DIAGNOSIS — D6489 Other specified anemias: Secondary | ICD-10-CM

## 2016-04-22 DIAGNOSIS — D539 Nutritional anemia, unspecified: Secondary | ICD-10-CM | POA: Diagnosis not present

## 2016-04-22 LAB — CBC WITH DIFFERENTIAL/PLATELET
Basophils Absolute: 0.1 10*3/uL (ref 0–0.1)
Basophils Relative: 1 %
EOS ABS: 0.2 10*3/uL (ref 0–0.7)
Eosinophils Relative: 3 %
HEMATOCRIT: 31 % — AB (ref 35.0–47.0)
HEMOGLOBIN: 10.5 g/dL — AB (ref 12.0–16.0)
LYMPHS ABS: 0.6 10*3/uL — AB (ref 1.0–3.6)
Lymphocytes Relative: 12 %
MCH: 31 pg (ref 26.0–34.0)
MCHC: 33.9 g/dL (ref 32.0–36.0)
MCV: 91.4 fL (ref 80.0–100.0)
MONOS PCT: 9 %
Monocytes Absolute: 0.5 10*3/uL (ref 0.2–0.9)
NEUTROS PCT: 75 %
Neutro Abs: 3.8 10*3/uL (ref 1.4–6.5)
Platelets: 356 10*3/uL (ref 150–440)
RBC: 3.39 MIL/uL — ABNORMAL LOW (ref 3.80–5.20)
RDW: 14.7 % — ABNORMAL HIGH (ref 11.5–14.5)
WBC: 5.1 10*3/uL (ref 3.6–11.0)

## 2016-04-25 ENCOUNTER — Ambulatory Visit
Admission: RE | Admit: 2016-04-25 | Discharge: 2016-04-25 | Disposition: A | Discharge status: Home / Self Care | Payer: Medicare Other | Source: Ambulatory Visit | Attending: Internal Medicine | Admitting: Internal Medicine

## 2016-04-25 DIAGNOSIS — I671 Cerebral aneurysm, nonruptured: Secondary | ICD-10-CM | POA: Diagnosis not present

## 2016-04-25 DIAGNOSIS — E041 Nontoxic single thyroid nodule: Secondary | ICD-10-CM | POA: Insufficient documentation

## 2016-04-25 DIAGNOSIS — Z85850 Personal history of malignant neoplasm of thyroid: Secondary | ICD-10-CM | POA: Diagnosis not present

## 2016-04-25 DIAGNOSIS — I7090 Unspecified atherosclerosis: Secondary | ICD-10-CM | POA: Diagnosis not present

## 2016-04-25 DIAGNOSIS — R1310 Dysphagia, unspecified: Secondary | ICD-10-CM | POA: Diagnosis present

## 2016-04-25 MED ORDER — IOPAMIDOL (ISOVUE-300) INJECTION 61%
75.0000 mL | Freq: Once | INTRAVENOUS | Status: AC | PRN
  Administered 2016-04-25: 75 mL via INTRAVENOUS

## 2016-05-19 ENCOUNTER — Ambulatory Visit: Payer: Self-pay | Admitting: Oncology

## 2016-05-19 ENCOUNTER — Other Ambulatory Visit: Payer: Self-pay

## 2016-05-20 ENCOUNTER — Other Ambulatory Visit: Payer: Self-pay

## 2016-05-20 ENCOUNTER — Ambulatory Visit: Payer: Self-pay | Admitting: Family Medicine

## 2016-05-23 ENCOUNTER — Other Ambulatory Visit
Admission: RE | Admit: 2016-05-23 | Discharge: 2016-05-23 | Disposition: A | Discharge status: Home / Self Care | Payer: Medicare Other | Source: Ambulatory Visit | Attending: Internal Medicine | Admitting: Internal Medicine

## 2016-05-23 ENCOUNTER — Ambulatory Visit
Admission: RE | Admit: 2016-05-23 | Discharge: 2016-05-23 | Disposition: A | Discharge status: Home / Self Care | Payer: Medicare Other | Source: Ambulatory Visit | Attending: Internal Medicine | Admitting: Internal Medicine

## 2016-05-23 ENCOUNTER — Other Ambulatory Visit: Payer: Self-pay | Admitting: Internal Medicine

## 2016-05-23 DIAGNOSIS — M7989 Other specified soft tissue disorders: Secondary | ICD-10-CM

## 2016-05-23 LAB — FIBRIN DERIVATIVES D-DIMER (ARMC ONLY): Fibrin derivatives D-dimer (ARMC): 2233 — ABNORMAL HIGH (ref 0–499)

## 2016-05-24 ENCOUNTER — Other Ambulatory Visit: Payer: Self-pay | Admitting: Internal Medicine

## 2016-05-24 DIAGNOSIS — R7989 Other specified abnormal findings of blood chemistry: Secondary | ICD-10-CM

## 2016-05-24 DIAGNOSIS — R0600 Dyspnea, unspecified: Secondary | ICD-10-CM

## 2016-05-25 ENCOUNTER — Other Ambulatory Visit: Payer: Self-pay | Admitting: *Deleted

## 2016-05-25 DIAGNOSIS — C73 Malignant neoplasm of thyroid gland: Secondary | ICD-10-CM

## 2016-05-26 ENCOUNTER — Inpatient Hospital Stay: Payer: Medicare Other | Attending: Internal Medicine

## 2016-05-26 ENCOUNTER — Inpatient Hospital Stay (HOSPITAL_BASED_OUTPATIENT_CLINIC_OR_DEPARTMENT_OTHER): Payer: Medicare Other | Admitting: Internal Medicine

## 2016-05-26 VITALS — BP 147/77 | HR 92 | Temp 98.6°F | Resp 18 | Wt 202.4 lb

## 2016-05-26 DIAGNOSIS — Z808 Family history of malignant neoplasm of other organs or systems: Secondary | ICD-10-CM

## 2016-05-26 DIAGNOSIS — C73 Malignant neoplasm of thyroid gland: Secondary | ICD-10-CM

## 2016-05-26 DIAGNOSIS — Z8051 Family history of malignant neoplasm of kidney: Secondary | ICD-10-CM | POA: Insufficient documentation

## 2016-05-26 DIAGNOSIS — D649 Anemia, unspecified: Secondary | ICD-10-CM

## 2016-05-26 DIAGNOSIS — I10 Essential (primary) hypertension: Secondary | ICD-10-CM | POA: Diagnosis not present

## 2016-05-26 DIAGNOSIS — Z923 Personal history of irradiation: Secondary | ICD-10-CM | POA: Diagnosis not present

## 2016-05-26 DIAGNOSIS — Z801 Family history of malignant neoplasm of trachea, bronchus and lung: Secondary | ICD-10-CM | POA: Insufficient documentation

## 2016-05-26 DIAGNOSIS — E785 Hyperlipidemia, unspecified: Secondary | ICD-10-CM | POA: Insufficient documentation

## 2016-05-26 DIAGNOSIS — R5383 Other fatigue: Secondary | ICD-10-CM | POA: Diagnosis not present

## 2016-05-26 DIAGNOSIS — J45909 Unspecified asthma, uncomplicated: Secondary | ICD-10-CM

## 2016-05-26 DIAGNOSIS — Z79899 Other long term (current) drug therapy: Secondary | ICD-10-CM | POA: Insufficient documentation

## 2016-05-26 DIAGNOSIS — K219 Gastro-esophageal reflux disease without esophagitis: Secondary | ICD-10-CM | POA: Insufficient documentation

## 2016-05-26 DIAGNOSIS — D6489 Other specified anemias: Secondary | ICD-10-CM

## 2016-05-26 DIAGNOSIS — R0602 Shortness of breath: Secondary | ICD-10-CM | POA: Diagnosis not present

## 2016-05-26 DIAGNOSIS — Z85850 Personal history of malignant neoplasm of thyroid: Secondary | ICD-10-CM

## 2016-05-26 DIAGNOSIS — K449 Diaphragmatic hernia without obstruction or gangrene: Secondary | ICD-10-CM | POA: Insufficient documentation

## 2016-05-26 DIAGNOSIS — D508 Other iron deficiency anemias: Secondary | ICD-10-CM

## 2016-05-26 LAB — IRON AND TIBC
IRON: 35 ug/dL (ref 28–170)
Saturation Ratios: 8 % — ABNORMAL LOW (ref 10.4–31.8)
TIBC: 454 ug/dL — ABNORMAL HIGH (ref 250–450)
UIBC: 419 ug/dL

## 2016-05-26 LAB — CBC WITH DIFFERENTIAL/PLATELET
BASOS ABS: 0 10*3/uL (ref 0–0.1)
BASOS PCT: 1 %
EOS ABS: 0.2 10*3/uL (ref 0–0.7)
EOS PCT: 3 %
HEMATOCRIT: 28.6 % — AB (ref 35.0–47.0)
Hemoglobin: 9.4 g/dL — ABNORMAL LOW (ref 12.0–16.0)
Lymphocytes Relative: 13 %
Lymphs Abs: 0.7 10*3/uL — ABNORMAL LOW (ref 1.0–3.6)
MCH: 29.2 pg (ref 26.0–34.0)
MCHC: 32.9 g/dL (ref 32.0–36.0)
MCV: 88.8 fL (ref 80.0–100.0)
MONO ABS: 0.4 10*3/uL (ref 0.2–0.9)
MONOS PCT: 7 %
Neutro Abs: 4.2 10*3/uL (ref 1.4–6.5)
Neutrophils Relative %: 76 %
PLATELETS: 437 10*3/uL (ref 150–440)
RBC: 3.22 MIL/uL — ABNORMAL LOW (ref 3.80–5.20)
RDW: 14.8 % — AB (ref 11.5–14.5)
WBC: 5.5 10*3/uL (ref 3.6–11.0)

## 2016-05-26 LAB — RETICULOCYTES
RBC.: 3.22 MIL/uL — ABNORMAL LOW (ref 3.80–5.20)
RETIC CT PCT: 2 % (ref 0.4–3.1)
Retic Count, Absolute: 64.4 10*3/uL (ref 19.0–183.0)

## 2016-05-26 LAB — FERRITIN: FERRITIN: 11 ng/mL (ref 11–307)

## 2016-05-26 LAB — LACTATE DEHYDROGENASE: LDH: 137 U/L (ref 98–192)

## 2016-05-26 NOTE — Progress Notes (Addendum)
[facility] OFFICE PROGRESS NOTE  Patient Care Team: Adin Hector, MD as PCP - General (Internal Medicine)  No matching staging information was found for the patient.   Oncology History   1999- right thyroid lobectomy by Dr. Orene Desanctis with unusual poorly differentiated cancer, no additional treatment  2015- recurrent mass in thyroid bed. PET-CT showed FDG avid lesion in thyroid bed + nasopharyx.  2015- Dr. 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 Margins not noted [facility], [place], #[id number]).   01/05/2014- Nasopharyngeal biopsy negative for malignancy  04/25/14- completed postoperative XRT completing 65.25 CGy in 29 fractions  05/2014 + 07/2014- PET avidity at the left nasopharynx and left oropharyngeal soft tissue as well as a hypermetabolic 1.5 cm mass in the right thyroid bed. Dr Johney Frame excised the paratracheal mass on 09/02/2014. Inspect of the nasopharynx at the time of surgery revealed no obvious abnormality and no biopsy was completed.   09/2014- NP avid area biopsied and negative for carcinoma. SHe underwent a thyroid FNA and that was negative for cancer.  # Chronic Anemia- unclear etiology [ EGD- hiatal hernia; Jan 2017- Dr.Rein]        Malignant neoplasm of thyroid gland ([place])   09/01/2014 Initial Diagnosis    Malignant neoplasm of thyroid gland [facility])     This is my first interaction with the patient; Patient's previous treating oncologist was Dr. Victory Dakin.  I reviewed the patient's prior charts/pertinent labs/imaging in detail; findings are summarized above.    INTERVAL HISTORY:  Sara Fields 75 y.o.  female pleasant patient above history of thyroid cancer; and also long-standing history of anemia of unclear etiology is here for follow-up.  Patient denies any blood in stools black  stools. Denies any nausea vomiting constipation. Denies any blood in urine.  Complains of significant fatigue. Complains of shortness of breath with exertion. Denies any swelling in the legs.  REVIEW OF SYSTEMS:  A complete 10 point review of system is done which is negative except mentioned above/history of present illness.   PAST MEDICAL HISTORY :  Past Medical History:  Diagnosis Date  . Anemia   . Asthma   . GERD (gastroesophageal reflux disease)   . Hyperlipidemia   . Hypertension   . Thyroid cancer ([place]) 1999 and 2015   Partial thyroidectomy with rad tx's.     PAST SURGICAL HISTORY :   Past Surgical History:  Procedure Laterality Date  . ABDOMINAL HYSTERECTOMY  1973   partial  . ESOPHAGOGASTRODUODENOSCOPY (EGD) WITH PROPOFOL N/A 11/16/2015   Procedure: ESOPHAGOGASTRODUODENOSCOPY (EGD) WITH PROPOFOL;  Surgeon: Josefine Class, MD;  Location: [facility] ENDOSCOPY;  Service: Endoscopy;  Laterality: N/A;  . THYROID SURGERY  1999 and 2015   Partial Thyroidectomy    FAMILY HISTORY :   Family History  Problem Relation Age of Onset  . Lung cancer Father   . Hypertension Father   . Bone cancer Sister   . Hypertension Sister   . Lung cancer Brother   . Hypertension Brother   . Kidney cancer Sister   . Hypertension Sister   . Lung cancer Sister   . Hypertension Sister     SOCIAL HISTORY:   Social History  Substance Use Topics  . Smoking status: Never Smoker  . Smokeless tobacco: Never Used  . Alcohol use No    ALLERGIES:  has  No Known Allergies.  MEDICATIONS:  Current Outpatient Prescriptions  Medication Sig Dispense Refill  . albuterol (PROVENTIL HFA;VENTOLIN HFA) 108 (90 Base) MCG/ACT inhaler Inhale into the lungs every 6 (six) hours as needed for wheezing or shortness of breath.    . allopurinol (ZYLOPRIM) 100 MG tablet Take 100 mg by mouth daily.    Marland Kitchen amLODipine (NORVASC) 5 MG tablet Take 1 tablet by mouth.    Marland Kitchen azelastine (ASTELIN) 0.1 % nasal spray Place 1  spray into both nostrils.    . budesonide-formoterol (SYMBICORT) 160-4.5 MCG/ACT inhaler Inhale 2 puffs into the lungs 2 (two) times daily.    . bumetanide (BUMEX) 1 MG tablet Take 1 mg by mouth daily.    . calcium carbonate (CALCIUM 600) 600 MG TABS tablet Take 600 mg by mouth 2 (two) times daily with a meal.    . hydrocortisone (CORTEF) 20 MG tablet Take 1 tablet by mouth.    . levothyroxine (SYNTHROID, LEVOTHROID) 50 MCG tablet Take 1 tablet by mouth.    Marland Kitchen lisinopril (PRINIVIL,ZESTRIL) 20 MG tablet Take 20 mg by mouth daily.    Marland Kitchen LORazepam (ATIVAN) 0.5 MG tablet Take 1 tablet by mouth.    . meclizine (ANTIVERT) 12.5 MG tablet Take 1 tablet by mouth.    . montelukast (SINGULAIR) 10 MG tablet Take 10 mg by mouth at bedtime.    Marland Kitchen omeprazole (PRILOSEC) 40 MG capsule Take 40 mg by mouth daily.    . pantoprazole (PROTONIX) 40 MG tablet Take 1 tablet by mouth.    . potassium chloride SA (KLOR-CON M20) 20 MEQ tablet Take 1 tablet by mouth.    . pravastatin (PRAVACHOL) 40 MG tablet Take 40 mg by mouth daily.    . propranolol (INDERAL) 20 MG tablet Take 20 mg by mouth 3 (three) times daily.    . traMADol (ULTRAM) 50 MG tablet Take 50 mg by mouth every 6 (six) hours as needed.     No current facility-administered medications for this visit.     PHYSICAL EXAMINATION: ECOG PERFORMANCE STATUS:   BP (!) 147/77 (BP Location: Left Arm, Patient Position: Sitting)   Pulse 92   Temp 98.6 F (37 C) (Tympanic)   Resp 18   Wt 202 lb 7 oz (91.8 kg)   BMI 31.71 kg/m   Filed Weights   05/26/16 1453  Weight: 202 lb 7 oz (91.8 kg)    GENERAL: Well-nourished well-developed; Alert, no distress and comfortable.   EYES: no pallor or icterus OROPHARYNX: no thrush or ulceration; good dentition  NECK: supple, no masses felt; Scar from thyroid surgery noted. LYMPH:  no palpable lymphadenopathy in the cervical, axillary or inguinal regions LUNGS: clear to auscultation and  No wheeze or crackles HEART/CVS:  regular rate & rhythm and no murmurs; No lower extremity edema ABDOMEN:abdomen soft, non-tender and normal bowel sounds Musculoskeletal:no cyanosis of digits and no clubbing  PSYCH: alert & oriented x 3 with fluent speech NEURO: no focal motor/sensory deficits SKIN:  no rashes or significant lesions  LABORATORY DATA:  I have reviewed the data as listed    Component Value Date/Time   NA 137 02/26/2016 0919   K 3.6 02/26/2016 0919   CL 104 02/26/2016 0919   CO2 25 02/26/2016 0919   GLUCOSE 97 02/26/2016 0919   BUN 18 02/26/2016 0919   CREATININE 1.20 (H) 02/26/2016 0919   CALCIUM 9.1 02/26/2016 0919   PROT 7.8 02/26/2016 0919   ALBUMIN 4.3 02/26/2016 0919   AST 21 02/26/2016  0919   ALT 8 (L) 02/26/2016 0919   ALKPHOS 78 02/26/2016 0919   BILITOT 0.6 02/26/2016 0919   GFRNONAA 43 (L) 02/26/2016 0919   GFRAA 50 (L) 02/26/2016 0919    No results found for: SPEP, UPEP  Lab Results  Component Value Date   WBC 5.5 05/26/2016   NEUTROABS 4.2 05/26/2016   HGB 9.4 (L) 05/26/2016   HCT 28.6 (L) 05/26/2016   MCV 88.8 05/26/2016   PLT 437 05/26/2016      Chemistry      Component Value Date/Time   NA 137 02/26/2016 0919   K 3.6 02/26/2016 0919   CL 104 02/26/2016 0919   CO2 25 02/26/2016 0919   BUN 18 02/26/2016 0919   CREATININE 1.20 (H) 02/26/2016 0919      Component Value Date/Time   CALCIUM 9.1 02/26/2016 0919   ALKPHOS 78 02/26/2016 0919   AST 21 02/26/2016 0919   ALT 8 (L) 02/26/2016 0919   BILITOT 0.6 02/26/2016 0919       RADIOGRAPHIC STUDIES: I have personally reviewed the radiological images as listed and agreed with the findings in the report. No results found.   ASSESSMENT & PLAN:  Malignant neoplasm of thyroid gland ([place]) # thyroid ca with local reurence s/p surgery '[]' [facility]; EBRT [2015; no RAI]. Check TSH and thyroglobulin levels.  # chronic anemia- repeat labs; discussed BMbx; she will discuss with family.  # Follow up based on labs/bone marrow Bx  plan.   # 25 minutes face-to-face with the patient discussing the above plan of care; more than 50% of time spent on prognosis/ natural history; counseling and coordination.  Addendum: Please inform pt that her tests show that she is low on iron- and for now I recommend IV Venofer weekly x 6; also inform that I plan to HOLD bone marrow biopsy for now.    # order- re-check cbc in 6 weeks; also order stool cards x2;UA;  follow up with me 3 months- cbc/cmp/ iron tibc/ ferritin.     Orders Placed This Encounter  Procedures  . CBC with Differential/Platelet    Standing Status:   Future    Number of Occurrences:   1    Standing Expiration Date:   06/30/2017  . Lactate dehydrogenase    Standing Status:   Future    Number of Occurrences:   1    Standing Expiration Date:   06/30/2017  . Ferritin    Standing Status:   Future    Number of Occurrences:   1    Standing Expiration Date:   06/30/2017  . Haptoglobin    Standing Status:   Future    Number of Occurrences:   1    Standing Expiration Date:   06/30/2017  . Reticulocytes    Standing Status:   Future    Number of Occurrences:   1    Standing Expiration Date:   06/30/2017  . Erythropoietin    Standing Status:   Future    Number of Occurrences:   1    Standing Expiration Date:   06/30/2017  . Kappa/lambda light chains    Standing Status:   Future    Number of Occurrences:   1    Standing Expiration Date:   06/30/2017  . Multiple Myeloma Panel (SPEP&IFE w/QIG)    Standing Status:   Future    Number of Occurrences:   1    Standing Expiration Date:   06/30/2017  . Iron and  TIBC    Standing Status:   Future    Number of Occurrences:   1    Standing Expiration Date:   06/30/2017  . TgAb+Thyroglobulin IMA or RIA    Standing Status:   Future    Number of Occurrences:   1    Standing Expiration Date:   05/26/2017  . Thyroid Panel With TSH    Standing Status:   Future    Number of Occurrences:   1    Standing Expiration Date:   05/26/2017   All  questions were answered. The patient knows to call the clinic with any problems, questions or concerns.      Cammie Sickle, MD 05/27/2016 8:18 AM

## 2016-05-26 NOTE — Assessment & Plan Note (Addendum)
#  thyroid ca with local reurence s/p surgery _0 [facility]; EBRT [2015; no RAI]. Check TSH and thyroglobulin levels.  # chronic anemia- repeat labs; discussed BMbx; she will discuss with family.  # Follow up based on labs/bone marrow Bx plan.   # 25 minutes face-to-face with the patient discussing the above plan of care; more than 50% of time spent on prognosis/ natural history; counseling and coordination.  Addendum: Please inform pt that her tests show that she is low on iron- and for now I recommend IV Venofer weekly x 6; also inform that I plan to HOLD bone marrow biopsy for now.    # order- re-check cbc in 6 weeks; also order stool cards x2;UA;  follow up with me 3 months- cbc/cmp/ iron tibc/ ferritin.

## 2016-05-27 ENCOUNTER — Telehealth: Payer: Self-pay | Admitting: *Deleted

## 2016-05-27 ENCOUNTER — Other Ambulatory Visit: Payer: Self-pay | Admitting: *Deleted

## 2016-05-27 DIAGNOSIS — D509 Iron deficiency anemia, unspecified: Secondary | ICD-10-CM

## 2016-05-27 LAB — HAPTOGLOBIN: HAPTOGLOBIN: 210 mg/dL — AB (ref 34–200)

## 2016-05-27 LAB — THYROID PANEL WITH TSH
Free Thyroxine Index: 1.9 (ref 1.2–4.9)
T3 Uptake Ratio: 24 % (ref 24–39)
T4, Total: 7.9 ug/dL (ref 4.5–12.0)
TSH: 4 u[IU]/mL (ref 0.450–4.500)

## 2016-05-27 LAB — KAPPA/LAMBDA LIGHT CHAINS
KAPPA FREE LGHT CHN: 24.5 mg/L — AB (ref 3.3–19.4)
Kappa, lambda light chain ratio: 1.16 (ref 0.26–1.65)
LAMDA FREE LIGHT CHAINS: 21.1 mg/L (ref 5.7–26.3)

## 2016-05-27 LAB — ERYTHROPOIETIN: Erythropoietin: 49.6 m[IU]/mL — ABNORMAL HIGH (ref 2.6–18.5)

## 2016-05-27 LAB — THYROGLOBULIN BY IMA: THYROGLOBULIN BY: 40.2 ng/mL — AB (ref 1.5–38.5)

## 2016-05-27 LAB — TGAB+THYROGLOBULIN IMA OR RIA: Thyroglobulin Antibody: <1 IU/mL (ref 0.0–0.9)

## 2016-05-27 NOTE — Telephone Encounter (Signed)
Spoke with patient. She verbalized understanding of the plan of care. She understands that a scheduler will call her with these new appointments.  Msg sent to cancer center scheduling to arrange pt's apts per md order.

## 2016-05-27 NOTE — Telephone Encounter (Signed)
-----  Message from Cammie Sickle, MD sent at 05/27/2016  8:16 AM EDT ----- Nira Conn- Please inform pt that her tests show that she is low on iron- and for now I recommend IV Venofer weekly x 6; also inform that I plan to HOLD bone marrow biopsy for now.    # order- re-check cbc in 6 weeks; also order stool cards x2;UA;  follow up with me 3 months- cbc/cmp/ iron tibc/ ferritin.

## 2016-05-30 ENCOUNTER — Ambulatory Visit
Admission: RE | Admit: 2016-05-30 | Discharge: 2016-05-30 | Disposition: A | Discharge status: Home / Self Care | Payer: Medicare Other | Source: Ambulatory Visit | Attending: Internal Medicine | Admitting: Internal Medicine

## 2016-05-30 DIAGNOSIS — K228 Other specified diseases of esophagus: Secondary | ICD-10-CM | POA: Insufficient documentation

## 2016-05-30 DIAGNOSIS — Q278 Other specified congenital malformations of peripheral vascular system: Secondary | ICD-10-CM | POA: Insufficient documentation

## 2016-05-30 DIAGNOSIS — I2510 Atherosclerotic heart disease of native coronary artery without angina pectoris: Secondary | ICD-10-CM | POA: Diagnosis not present

## 2016-05-30 DIAGNOSIS — R0600 Dyspnea, unspecified: Secondary | ICD-10-CM

## 2016-05-30 DIAGNOSIS — R791 Abnormal coagulation profile: Secondary | ICD-10-CM | POA: Insufficient documentation

## 2016-05-30 DIAGNOSIS — R918 Other nonspecific abnormal finding of lung field: Secondary | ICD-10-CM | POA: Insufficient documentation

## 2016-05-30 DIAGNOSIS — R7989 Other specified abnormal findings of blood chemistry: Secondary | ICD-10-CM

## 2016-05-30 DIAGNOSIS — K449 Diaphragmatic hernia without obstruction or gangrene: Secondary | ICD-10-CM | POA: Insufficient documentation

## 2016-05-30 LAB — MULTIPLE MYELOMA PANEL, SERUM
ALBUMIN/GLOB SERPL: 1.1 (ref 0.7–1.7)
ALPHA 1: 0.3 g/dL (ref 0.0–0.4)
Albumin SerPl Elph-Mcnc: 3.6 g/dL (ref 2.9–4.4)
Alpha2 Glob SerPl Elph-Mcnc: 1 g/dL (ref 0.4–1.0)
B-Globulin SerPl Elph-Mcnc: 1.4 g/dL — ABNORMAL HIGH (ref 0.7–1.3)
GAMMA GLOB SERPL ELPH-MCNC: 1 g/dL (ref 0.4–1.8)
GLOBULIN, TOTAL: 3.6 g/dL (ref 2.2–3.9)
IGA: 243 mg/dL (ref 64–422)
IGM, SERUM: 118 mg/dL (ref 26–217)
IgG (Immunoglobin G), Serum: 1140 mg/dL (ref 700–1600)
M Protein SerPl Elph-Mcnc: 0.4 g/dL — ABNORMAL HIGH
Total Protein ELP: 7.2 g/dL (ref 6.0–8.5)

## 2016-05-30 MED ORDER — IOPAMIDOL (ISOVUE-370) INJECTION 76%
75.0000 mL | Freq: Once | INTRAVENOUS | Status: AC | PRN
  Administered 2016-05-30: 75 mL via INTRAVENOUS

## 2016-05-31 ENCOUNTER — Other Ambulatory Visit: Payer: Self-pay | Admitting: Internal Medicine

## 2016-05-31 ENCOUNTER — Inpatient Hospital Stay: Payer: Medicare Other

## 2016-06-06 ENCOUNTER — Other Ambulatory Visit: Payer: Self-pay | Admitting: Internal Medicine

## 2016-06-07 ENCOUNTER — Encounter (INDEPENDENT_AMBULATORY_CARE_PROVIDER_SITE_OTHER): Payer: Self-pay

## 2016-06-07 ENCOUNTER — Inpatient Hospital Stay: Payer: Medicare Other

## 2016-06-07 VITALS — BP 114/70 | HR 67

## 2016-06-07 DIAGNOSIS — Z85850 Personal history of malignant neoplasm of thyroid: Secondary | ICD-10-CM | POA: Diagnosis not present

## 2016-06-07 DIAGNOSIS — D508 Other iron deficiency anemias: Secondary | ICD-10-CM

## 2016-06-07 MED ORDER — SODIUM CHLORIDE 0.9 % IV SOLN
200.0000 mg | Freq: Once | INTRAVENOUS | Status: AC
  Administered 2016-06-07: 200 mg via INTRAVENOUS
  Filled 2016-06-07: qty 10

## 2016-06-07 MED ORDER — SODIUM CHLORIDE 0.9 % IV SOLN
Freq: Once | INTRAVENOUS | Status: AC
  Administered 2016-06-07: 14:00:00 via INTRAVENOUS
  Filled 2016-06-07: qty 1000

## 2016-06-14 ENCOUNTER — Inpatient Hospital Stay: Payer: Medicare Other

## 2016-06-14 VITALS — BP 119/72 | HR 67 | Temp 96.7°F | Resp 20

## 2016-06-14 DIAGNOSIS — D508 Other iron deficiency anemias: Secondary | ICD-10-CM

## 2016-06-14 DIAGNOSIS — Z85850 Personal history of malignant neoplasm of thyroid: Secondary | ICD-10-CM | POA: Diagnosis not present

## 2016-06-14 MED ORDER — SODIUM CHLORIDE 0.9 % IV SOLN
200.0000 mg | Freq: Once | INTRAVENOUS | Status: AC
  Administered 2016-06-14: 200 mg via INTRAVENOUS
  Filled 2016-06-14: qty 10

## 2016-06-14 MED ORDER — SODIUM CHLORIDE 0.9 % IV SOLN
Freq: Once | INTRAVENOUS | Status: AC
  Administered 2016-06-14: 15:00:00 via INTRAVENOUS
  Filled 2016-06-14: qty 1000

## 2016-06-21 ENCOUNTER — Inpatient Hospital Stay: Payer: Medicare Other

## 2016-06-21 VITALS — BP 124/79 | HR 70 | Resp 20

## 2016-06-21 DIAGNOSIS — Z85850 Personal history of malignant neoplasm of thyroid: Secondary | ICD-10-CM | POA: Diagnosis not present

## 2016-06-21 DIAGNOSIS — D508 Other iron deficiency anemias: Secondary | ICD-10-CM

## 2016-06-21 MED ORDER — SODIUM CHLORIDE 0.9 % IV SOLN
Freq: Once | INTRAVENOUS | Status: AC
  Administered 2016-06-21: 15:00:00 via INTRAVENOUS
  Filled 2016-06-21: qty 1000

## 2016-06-21 MED ORDER — SODIUM CHLORIDE 0.9 % IV SOLN
200.0000 mg | Freq: Once | INTRAVENOUS | Status: AC
  Administered 2016-06-21: 200 mg via INTRAVENOUS
  Filled 2016-06-21: qty 10

## 2016-06-28 ENCOUNTER — Ambulatory Visit: Payer: Self-pay

## 2016-06-29 ENCOUNTER — Inpatient Hospital Stay: Payer: Medicare Other | Attending: Internal Medicine

## 2016-06-29 ENCOUNTER — Inpatient Hospital Stay (HOSPITAL_BASED_OUTPATIENT_CLINIC_OR_DEPARTMENT_OTHER): Payer: Medicare Other | Admitting: Internal Medicine

## 2016-06-29 VITALS — BP 129/80 | HR 69 | Temp 97.8°F | Resp 20 | Ht 67.0 in | Wt 205.5 lb

## 2016-06-29 DIAGNOSIS — C73 Malignant neoplasm of thyroid gland: Secondary | ICD-10-CM

## 2016-06-29 DIAGNOSIS — D509 Iron deficiency anemia, unspecified: Secondary | ICD-10-CM

## 2016-06-29 DIAGNOSIS — R5383 Other fatigue: Secondary | ICD-10-CM | POA: Diagnosis not present

## 2016-06-29 DIAGNOSIS — E785 Hyperlipidemia, unspecified: Secondary | ICD-10-CM | POA: Insufficient documentation

## 2016-06-29 DIAGNOSIS — Z808 Family history of malignant neoplasm of other organs or systems: Secondary | ICD-10-CM | POA: Diagnosis not present

## 2016-06-29 DIAGNOSIS — R0602 Shortness of breath: Secondary | ICD-10-CM

## 2016-06-29 DIAGNOSIS — K219 Gastro-esophageal reflux disease without esophagitis: Secondary | ICD-10-CM

## 2016-06-29 DIAGNOSIS — I10 Essential (primary) hypertension: Secondary | ICD-10-CM | POA: Insufficient documentation

## 2016-06-29 DIAGNOSIS — J45909 Unspecified asthma, uncomplicated: Secondary | ICD-10-CM | POA: Insufficient documentation

## 2016-06-29 DIAGNOSIS — Z801 Family history of malignant neoplasm of trachea, bronchus and lung: Secondary | ICD-10-CM | POA: Insufficient documentation

## 2016-06-29 DIAGNOSIS — Z85850 Personal history of malignant neoplasm of thyroid: Secondary | ICD-10-CM | POA: Insufficient documentation

## 2016-06-29 DIAGNOSIS — Z923 Personal history of irradiation: Secondary | ICD-10-CM | POA: Diagnosis not present

## 2016-06-29 DIAGNOSIS — Z8051 Family history of malignant neoplasm of kidney: Secondary | ICD-10-CM | POA: Diagnosis not present

## 2016-06-29 DIAGNOSIS — D472 Monoclonal gammopathy: Secondary | ICD-10-CM | POA: Diagnosis not present

## 2016-06-29 DIAGNOSIS — Z79899 Other long term (current) drug therapy: Secondary | ICD-10-CM | POA: Insufficient documentation

## 2016-06-29 DIAGNOSIS — D508 Other iron deficiency anemias: Secondary | ICD-10-CM

## 2016-06-29 MED ORDER — SODIUM CHLORIDE 0.9 % IV SOLN
200.0000 mg | Freq: Once | INTRAVENOUS | Status: AC
  Administered 2016-06-29: 200 mg via INTRAVENOUS
  Filled 2016-06-29: qty 10

## 2016-06-29 MED ORDER — SODIUM CHLORIDE 0.9 % IV SOLN
Freq: Once | INTRAVENOUS | Status: AC
  Administered 2016-06-29: 20 mL/h via INTRAVENOUS
  Filled 2016-06-29: qty 1000

## 2016-06-29 NOTE — Assessment & Plan Note (Signed)
#   thyroid ca with local reurence s/p surgery [] [facility]; EBRT [2015; no RAI]. TSH- N;  thyroglobulin levels- slightly up at 40.5 recheck in 4 weeks. Get US neck.   # IDA on IV venofer 3/6- improving energy levels. Recommend checking stool occult; UA.   # MGUS- IgGKappa- 0.4gm; N- K/L. discussed the importance/monitor for now.  # follow up in 4 weeks or so/labs.

## 2016-06-29 NOTE — Progress Notes (Signed)
[facility] OFFICE PROGRESS NOTE  Patient Care Team: Adin Hector, MD as PCP - General (Internal Medicine)  No matching staging information was found for the patient.   Oncology History   1999- right thyroid lobectomy by Dr. Orene Desanctis with unusual poorly differentiated cancer, no additional treatment  2015- recurrent mass in thyroid bed. PET-CT showed FDG avid lesion in thyroid bed + nasopharyx.  2015- Dr. 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 Margins not noted [facility], [place], #[id number]).   01/05/2014- Nasopharyngeal biopsy negative for malignancy  04/25/14- completed postoperative XRT completing 65.25 CGy in 29 fractions  05/2014 + 07/2014- PET avidity at the left nasopharynx and left oropharyngeal soft tissue as well as a hypermetabolic 1.5 cm mass in the right thyroid bed. Dr Johney Frame excised the paratracheal mass on 09/02/2014. Inspect of the nasopharynx at the time of surgery revealed no obvious abnormality and no biopsy was completed.   09/2014- NP avid area biopsied and negative for carcinoma. SHe underwent a thyroid FNA and that was negative for cancer.  # Chronic Anemia- IDA [ EGD- hiatal hernia; Jan 2017- Dr.Rein] AUG 2017- IV Venofer  # AUG 2017- Thyroglobulin 40/ US neck  # AUG 2017- IgG kappa 0.4gm//N-kappa-Lamda light chains       Malignant neoplasm of thyroid gland ([place])   09/01/2014 Initial Diagnosis    Malignant neoplasm of thyroid gland ([place])        INTERVAL HISTORY:  Sara Fields 75 y.o.  female pleasant patient above history of thyroid cancer; and also long-standing history of anemia of unclear etiology is here for follow-up. Patient noted to have iron deficiency currently on IV iron.  Patient has 3 treatments so far. No reactions. Patient denies any blood in stools black  stools. Denies any nausea vomiting constipation. Denies any blood in urine. She had colonoscopy approximately 3 years ago.  She also complains of tightness in the neck at the site of her previous thyroid surgery. Fatigue and shortness of breath with exertion is improved. Denies any swelling in the legs.  REVIEW OF SYSTEMS:  A complete 10 point review of system is done which is negative except mentioned above/history of present illness.   PAST MEDICAL HISTORY :  Past Medical History:  Diagnosis Date  . Anemia   . Asthma   . GERD (gastroesophageal reflux disease)   . Hyperlipidemia   . Hypertension   . Thyroid cancer ([place]) 1999 and 2015   Partial thyroidectomy with rad tx's.     PAST SURGICAL HISTORY :   Past Surgical History:  Procedure Laterality Date  . ABDOMINAL HYSTERECTOMY  1973   partial  . ESOPHAGOGASTRODUODENOSCOPY (EGD) WITH PROPOFOL N/A 11/16/2015   Procedure: ESOPHAGOGASTRODUODENOSCOPY (EGD) WITH PROPOFOL;  Surgeon: Josefine Class, MD;  Location: [facility] ENDOSCOPY;  Service: Endoscopy;  Laterality: N/A;  . THYROID SURGERY  1999 and 2015   Partial Thyroidectomy    FAMILY HISTORY :   Family History  Problem Relation Age of Onset  . Lung cancer Father   . Hypertension Father   . Bone cancer Sister   . Hypertension Sister   . Lung cancer Brother   . Hypertension Brother   . Kidney cancer Sister   . Hypertension Sister   . Lung cancer Sister   . Hypertension Sister     SOCIAL HISTORY:  Social History  Substance Use Topics  . Smoking status: Never Smoker  . Smokeless tobacco: Never Used  . Alcohol use No    ALLERGIES:  has No Known Allergies.  MEDICATIONS:  Current Outpatient Prescriptions  Medication Sig Dispense Refill  . albuterol (PROVENTIL HFA;VENTOLIN HFA) 108 (90 Base) MCG/ACT inhaler Inhale into the lungs every 6 (six) hours as needed for wheezing or shortness of breath.    . allopurinol (ZYLOPRIM) 100 MG tablet Take 100 mg by mouth daily.    Marland Kitchen  amLODipine (NORVASC) 5 MG tablet Take 1 tablet by mouth.    Marland Kitchen azelastine (ASTELIN) 0.1 % nasal spray Place 1 spray into both nostrils.    . budesonide-formoterol (SYMBICORT) 160-4.5 MCG/ACT inhaler Inhale 2 puffs into the lungs 2 (two) times daily.    . bumetanide (BUMEX) 1 MG tablet Take 1 mg by mouth daily.    . calcium carbonate (CALCIUM 600) 600 MG TABS tablet Take 600 mg by mouth 2 (two) times daily with a meal.    . hydrocortisone (CORTEF) 20 MG tablet Take 1 tablet by mouth.    . levothyroxine (SYNTHROID, LEVOTHROID) 50 MCG tablet Take 1 tablet by mouth.    Marland Kitchen lisinopril (PRINIVIL,ZESTRIL) 20 MG tablet Take 20 mg by mouth daily.    Marland Kitchen LORazepam (ATIVAN) 0.5 MG tablet Take 1 tablet by mouth.    . meclizine (ANTIVERT) 12.5 MG tablet Take 1 tablet by mouth.    . montelukast (SINGULAIR) 10 MG tablet Take 10 mg by mouth at bedtime.    Marland Kitchen omeprazole (PRILOSEC) 40 MG capsule Take 40 mg by mouth daily.    . potassium chloride SA (KLOR-CON M20) 20 MEQ tablet Take 1 tablet by mouth.    . pravastatin (PRAVACHOL) 40 MG tablet Take 40 mg by mouth daily.    . propranolol (INDERAL) 20 MG tablet Take 20 mg by mouth 3 (three) times daily.    . traMADol (ULTRAM) 50 MG tablet Take 50 mg by mouth every 6 (six) hours as needed.     Current Facility-Administered Medications  Medication Dose Route Frequency Provider Last Rate Last Dose  . iron sucrose (VENOFER) 200 mg in sodium chloride 0.9 % 100 mL IVPB  200 mg Intravenous Once Cammie Sickle, MD 146.7 mL/hr at 06/29/16 1245 200 mg at 06/29/16 1245    PHYSICAL EXAMINATION: ECOG PERFORMANCE STATUS:   BP 129/80 (BP Location: Left Arm, Patient Position: Sitting)   Pulse 69   Temp 97.8 F (36.6 C) (Tympanic)   Resp 20   Ht 5\' 7"  (1.702 m)   Wt 205 lb 8 oz (93.2 kg)   BMI 32.19 kg/m   Filed Weights   06/29/16 1115  Weight: 205 lb 8 oz (93.2 kg)    GENERAL: Well-nourished well-developed; Alert, no distress and comfortable.   EYES: no pallor or  icterus OROPHARYNX: no thrush or ulceration; good dentition  NECK: supple, no masses felt; Scar from thyroid surgery noted. LYMPH:  no palpable lymphadenopathy in the cervical, axillary or inguinal regions LUNGS: clear to auscultation and  No wheeze or crackles HEART/CVS: regular rate & rhythm and no murmurs; No lower extremity edema ABDOMEN:abdomen soft, non-tender and normal bowel sounds Musculoskeletal:no cyanosis of digits and no clubbing  PSYCH: alert & oriented x 3 with fluent speech NEURO: no focal motor/sensory deficits SKIN:  no rashes or significant lesions  LABORATORY DATA:  I have reviewed the data as listed    Component Value Date/Time   NA 137 02/26/2016 0919  K 3.6 02/26/2016 0919   CL 104 02/26/2016 0919   CO2 25 02/26/2016 0919   GLUCOSE 97 02/26/2016 0919   BUN 18 02/26/2016 0919   CREATININE 1.20 (H) 02/26/2016 0919   CALCIUM 9.1 02/26/2016 0919   PROT 7.8 02/26/2016 0919   ALBUMIN 4.3 02/26/2016 0919   AST 21 02/26/2016 0919   ALT 8 (L) 02/26/2016 0919   ALKPHOS 78 02/26/2016 0919   BILITOT 0.6 02/26/2016 0919   GFRNONAA 43 (L) 02/26/2016 0919   GFRAA 50 (L) 02/26/2016 0919    No results found for: SPEP, UPEP  Lab Results  Component Value Date   WBC 5.5 05/26/2016   NEUTROABS 4.2 05/26/2016   HGB 9.4 (L) 05/26/2016   HCT 28.6 (L) 05/26/2016   MCV 88.8 05/26/2016   PLT 437 05/26/2016      Chemistry      Component Value Date/Time   NA 137 02/26/2016 0919   K 3.6 02/26/2016 0919   CL 104 02/26/2016 0919   CO2 25 02/26/2016 0919   BUN 18 02/26/2016 0919   CREATININE 1.20 (H) 02/26/2016 0919      Component Value Date/Time   CALCIUM 9.1 02/26/2016 0919   ALKPHOS 78 02/26/2016 0919   AST 21 02/26/2016 0919   ALT 8 (L) 02/26/2016 0919   BILITOT 0.6 02/26/2016 0919       RADIOGRAPHIC STUDIES: I have personally reviewed the radiological images as listed and agreed with the findings in the report. No results found.   ASSESSMENT & PLAN:   Malignant neoplasm of thyroid gland ([place]) # thyroid ca with local reurence s/p surgery [] [facility]; EBRT [2015; no RAI]. TSH- N;  thyroglobulin levels- slightly up at 40.5 recheck in 4 weeks. Get US neck.   # IDA on IV venofer 3/6- improving energy levels. Recommend checking stool occult; UA.   # MGUS- IgGKappa- 0.4gm; N- K/L. discussed the importance/monitor for now.  # follow up in 4 weeks or so/labs.    Orders Placed This Encounter  Procedures  . US Soft Tissue Head/Neck    Standing Status:   Future    Standing Expiration Date:   06/29/2017    Order Specific Question:   Reason for Exam (SYMPTOM  OR DIAGNOSIS REQUIRED)    Answer:   thyroid cancer    Order Specific Question:   Preferred imaging location?    Answer:   [facility]  . CBC with Differential    Standing Status:   Future    Standing Expiration Date:   06/29/2017  . Comprehensive metabolic panel    Standing Status:   Future    Standing Expiration Date:   06/29/2017  . Ferritin    Standing Status:   Future    Standing Expiration Date:   06/29/2017  . Iron and TIBC    Standing Status:   Future    Standing Expiration Date:   06/29/2017   All questions were answered. The patient knows to call the clinic with any problems, questions or concerns.      Cammie Sickle, MD 06/29/2016 1:08 PM

## 2016-06-29 NOTE — Progress Notes (Signed)
denies any pain at this moment. h/o "sharpe pains 8 in neck when my neck hurts. I could be sitting and doing abosulting nothing and the pain just occurs"

## 2016-07-05 ENCOUNTER — Inpatient Hospital Stay: Payer: Medicare Other

## 2016-07-05 DIAGNOSIS — D509 Iron deficiency anemia, unspecified: Secondary | ICD-10-CM

## 2016-07-05 LAB — URINALYSIS COMPLETE WITH MICROSCOPIC (ARMC ONLY)
BACTERIA UA: NONE SEEN
Bilirubin Urine: NEGATIVE
Glucose, UA: NEGATIVE mg/dL
HGB URINE DIPSTICK: NEGATIVE
Ketones, ur: NEGATIVE mg/dL
Nitrite: NEGATIVE
PH: 5 (ref 5.0–8.0)
Protein, ur: NEGATIVE mg/dL
Specific Gravity, Urine: 1.018 (ref 1.005–1.030)

## 2016-07-05 LAB — CBC WITH DIFFERENTIAL/PLATELET
BASOS ABS: 0.1 10*3/uL (ref 0–0.1)
Basophils Relative: 1 %
Eosinophils Absolute: 0.1 10*3/uL (ref 0–0.7)
Eosinophils Relative: 3 %
HEMATOCRIT: 31.5 % — AB (ref 35.0–47.0)
Hemoglobin: 10.6 g/dL — ABNORMAL LOW (ref 12.0–16.0)
LYMPHS PCT: 14 %
Lymphs Abs: 0.7 10*3/uL — ABNORMAL LOW (ref 1.0–3.6)
MCH: 30.1 pg (ref 26.0–34.0)
MCHC: 33.7 g/dL (ref 32.0–36.0)
MCV: 89.3 fL (ref 80.0–100.0)
Monocytes Absolute: 0.4 10*3/uL (ref 0.2–0.9)
Monocytes Relative: 7 %
NEUTROS ABS: 3.9 10*3/uL (ref 1.4–6.5)
Neutrophils Relative %: 75 %
Platelets: 336 10*3/uL (ref 150–440)
RBC: 3.53 MIL/uL — AB (ref 3.80–5.20)
RDW: 19.1 % — ABNORMAL HIGH (ref 11.5–14.5)
WBC: 5.2 10*3/uL (ref 3.6–11.0)

## 2016-07-05 NOTE — Progress Notes (Signed)
Unable to receive venofer today. Attempted IV access x4. Unsuccessful. Pt states she will come next week for her scheduled appointment unless we call her with a new appointment.

## 2016-07-06 DIAGNOSIS — D509 Iron deficiency anemia, unspecified: Secondary | ICD-10-CM | POA: Diagnosis not present

## 2016-07-07 DIAGNOSIS — D509 Iron deficiency anemia, unspecified: Secondary | ICD-10-CM | POA: Diagnosis not present

## 2016-07-08 ENCOUNTER — Other Ambulatory Visit: Payer: Self-pay

## 2016-07-08 DIAGNOSIS — D509 Iron deficiency anemia, unspecified: Secondary | ICD-10-CM | POA: Diagnosis not present

## 2016-07-08 DIAGNOSIS — D508 Other iron deficiency anemias: Secondary | ICD-10-CM

## 2016-07-08 LAB — OCCULT BLOOD X 1 CARD TO LAB, STOOL
FECAL OCCULT BLD: NEGATIVE
FECAL OCCULT BLD: NEGATIVE
FECAL OCCULT BLD: NEGATIVE

## 2016-07-20 ENCOUNTER — Ambulatory Visit
Admission: RE | Admit: 2016-07-20 | Discharge: 2016-07-20 | Disposition: A | Discharge status: Home / Self Care | Payer: Medicare Other | Source: Ambulatory Visit | Attending: Internal Medicine | Admitting: Internal Medicine

## 2016-07-20 DIAGNOSIS — E041 Nontoxic single thyroid nodule: Secondary | ICD-10-CM | POA: Diagnosis not present

## 2016-07-20 DIAGNOSIS — C73 Malignant neoplasm of thyroid gland: Secondary | ICD-10-CM | POA: Insufficient documentation

## 2016-07-20 DIAGNOSIS — E890 Postprocedural hypothyroidism: Secondary | ICD-10-CM | POA: Diagnosis not present

## 2016-07-25 ENCOUNTER — Inpatient Hospital Stay: Payer: Medicare Other | Attending: Internal Medicine

## 2016-07-25 DIAGNOSIS — I10 Essential (primary) hypertension: Secondary | ICD-10-CM | POA: Diagnosis not present

## 2016-07-25 DIAGNOSIS — Z79899 Other long term (current) drug therapy: Secondary | ICD-10-CM | POA: Insufficient documentation

## 2016-07-25 DIAGNOSIS — D509 Iron deficiency anemia, unspecified: Secondary | ICD-10-CM | POA: Insufficient documentation

## 2016-07-25 DIAGNOSIS — Z800 Family history of malignant neoplasm of digestive organs: Secondary | ICD-10-CM | POA: Diagnosis not present

## 2016-07-25 DIAGNOSIS — D472 Monoclonal gammopathy: Secondary | ICD-10-CM | POA: Diagnosis not present

## 2016-07-25 DIAGNOSIS — E890 Postprocedural hypothyroidism: Secondary | ICD-10-CM | POA: Insufficient documentation

## 2016-07-25 DIAGNOSIS — E785 Hyperlipidemia, unspecified: Secondary | ICD-10-CM | POA: Insufficient documentation

## 2016-07-25 DIAGNOSIS — J45909 Unspecified asthma, uncomplicated: Secondary | ICD-10-CM | POA: Insufficient documentation

## 2016-07-25 DIAGNOSIS — Z801 Family history of malignant neoplasm of trachea, bronchus and lung: Secondary | ICD-10-CM | POA: Insufficient documentation

## 2016-07-25 DIAGNOSIS — K219 Gastro-esophageal reflux disease without esophagitis: Secondary | ICD-10-CM | POA: Diagnosis not present

## 2016-07-25 DIAGNOSIS — C73 Malignant neoplasm of thyroid gland: Secondary | ICD-10-CM

## 2016-07-25 DIAGNOSIS — Z808 Family history of malignant neoplasm of other organs or systems: Secondary | ICD-10-CM | POA: Insufficient documentation

## 2016-07-25 DIAGNOSIS — Z85850 Personal history of malignant neoplasm of thyroid: Secondary | ICD-10-CM | POA: Diagnosis present

## 2016-07-25 LAB — COMPREHENSIVE METABOLIC PANEL
ALT: 9 U/L — ABNORMAL LOW (ref 14–54)
ANION GAP: 6 (ref 5–15)
AST: 19 U/L (ref 15–41)
Albumin: 4 g/dL (ref 3.5–5.0)
Alkaline Phosphatase: 81 U/L (ref 38–126)
BILIRUBIN TOTAL: 0.4 mg/dL (ref 0.3–1.2)
BUN: 24 mg/dL — ABNORMAL HIGH (ref 6–20)
CALCIUM: 9.2 mg/dL (ref 8.9–10.3)
CO2: 25 mmol/L (ref 22–32)
Chloride: 105 mmol/L (ref 101–111)
Creatinine, Ser: 1.24 mg/dL — ABNORMAL HIGH (ref 0.44–1.00)
GFR, EST AFRICAN AMERICAN: 48 mL/min — AB (ref >60)
GFR, EST NON AFRICAN AMERICAN: 41 mL/min — AB (ref >60)
Glucose, Bld: 113 mg/dL — ABNORMAL HIGH (ref 65–99)
POTASSIUM: 4 mmol/L (ref 3.5–5.1)
Sodium: 136 mmol/L (ref 135–145)
TOTAL PROTEIN: 7.5 g/dL (ref 6.5–8.1)

## 2016-07-25 LAB — CBC WITH DIFFERENTIAL/PLATELET
BASOS ABS: 0 10*3/uL (ref 0–0.1)
BASOS PCT: 1 %
EOS ABS: 0.1 10*3/uL (ref 0–0.7)
Eosinophils Relative: 3 %
HCT: 27.7 % — ABNORMAL LOW (ref 35.0–47.0)
HEMOGLOBIN: 9.2 g/dL — AB (ref 12.0–16.0)
LYMPHS ABS: 0.6 10*3/uL — AB (ref 1.0–3.6)
Lymphocytes Relative: 10 %
MCH: 30.1 pg (ref 26.0–34.0)
MCHC: 33.4 g/dL (ref 32.0–36.0)
MCV: 90.2 fL (ref 80.0–100.0)
Monocytes Absolute: 0.3 10*3/uL (ref 0.2–0.9)
Monocytes Relative: 6 %
NEUTROS PCT: 80 %
Neutro Abs: 4.7 10*3/uL (ref 1.4–6.5)
Platelets: 301 10*3/uL (ref 150–440)
RBC: 3.07 MIL/uL — AB (ref 3.80–5.20)
RDW: 19.6 % — ABNORMAL HIGH (ref 11.5–14.5)
WBC: 5.8 10*3/uL (ref 3.6–11.0)

## 2016-07-25 LAB — IRON AND TIBC
Iron: 64 ug/dL (ref 28–170)
SATURATION RATIOS: 20 % (ref 10.4–31.8)
TIBC: 321 ug/dL (ref 250–450)
UIBC: 257 ug/dL

## 2016-07-25 LAB — FERRITIN: FERRITIN: 130 ng/mL (ref 11–307)

## 2016-07-27 ENCOUNTER — Inpatient Hospital Stay: Payer: Medicare Other

## 2016-07-27 ENCOUNTER — Inpatient Hospital Stay (HOSPITAL_BASED_OUTPATIENT_CLINIC_OR_DEPARTMENT_OTHER): Payer: Medicare Other | Admitting: Internal Medicine

## 2016-07-27 VITALS — BP 125/74 | HR 69 | Temp 97.1°F | Resp 22 | Ht 67.0 in | Wt 205.0 lb

## 2016-07-27 DIAGNOSIS — D472 Monoclonal gammopathy: Secondary | ICD-10-CM

## 2016-07-27 DIAGNOSIS — E785 Hyperlipidemia, unspecified: Secondary | ICD-10-CM

## 2016-07-27 DIAGNOSIS — D508 Other iron deficiency anemias: Secondary | ICD-10-CM

## 2016-07-27 DIAGNOSIS — C73 Malignant neoplasm of thyroid gland: Secondary | ICD-10-CM

## 2016-07-27 DIAGNOSIS — E890 Postprocedural hypothyroidism: Secondary | ICD-10-CM | POA: Diagnosis not present

## 2016-07-27 DIAGNOSIS — Z800 Family history of malignant neoplasm of digestive organs: Secondary | ICD-10-CM

## 2016-07-27 DIAGNOSIS — I10 Essential (primary) hypertension: Secondary | ICD-10-CM

## 2016-07-27 DIAGNOSIS — Z85850 Personal history of malignant neoplasm of thyroid: Secondary | ICD-10-CM

## 2016-07-27 DIAGNOSIS — K219 Gastro-esophageal reflux disease without esophagitis: Secondary | ICD-10-CM

## 2016-07-27 DIAGNOSIS — J45909 Unspecified asthma, uncomplicated: Secondary | ICD-10-CM

## 2016-07-27 DIAGNOSIS — Z79899 Other long term (current) drug therapy: Secondary | ICD-10-CM

## 2016-07-27 DIAGNOSIS — Z808 Family history of malignant neoplasm of other organs or systems: Secondary | ICD-10-CM

## 2016-07-27 DIAGNOSIS — D509 Iron deficiency anemia, unspecified: Secondary | ICD-10-CM

## 2016-07-27 DIAGNOSIS — Z801 Family history of malignant neoplasm of trachea, bronchus and lung: Secondary | ICD-10-CM

## 2016-07-27 MED ORDER — SODIUM CHLORIDE 0.9 % IV SOLN
Freq: Once | INTRAVENOUS | Status: AC
  Administered 2016-07-27: 13:00:00 via INTRAVENOUS
  Filled 2016-07-27: qty 1000

## 2016-07-27 MED ORDER — SODIUM CHLORIDE 0.9 % IV SOLN
200.0000 mg | Freq: Once | INTRAVENOUS | Status: AC
  Administered 2016-07-27: 200 mg via INTRAVENOUS
  Filled 2016-07-27: qty 10

## 2016-07-27 NOTE — Assessment & Plan Note (Addendum)
#  thyroid ca with local reurence s/p surgery '[]'$ [facility]; EBRT [2015; no RAI]. TSH- N;  thyroglobulin levels- slightly up at 40.5 US- neck 47m nodule/ not concerning.   # IDA on IV venofer 3/6- improving energy levels. Hemoglobin today 9.6. Unclear etiology- continue IV iron 4 more treatments. If not improvement and recommend bone marrow biopsy.  # MGUS- IgGKappa- 0.4gm; N- K/L. discussed the importance/monitor for now.  # follow up in 6 weeks or so/labs. CBC prior.

## 2016-07-27 NOTE — Progress Notes (Signed)
[place] OFFICE PROGRESS NOTE  Patient Care Team: Adin Hector, MD as PCP - General (Internal Medicine)  No matching staging information was found for the patient.   Oncology History   1999- right thyroid lobectomy by Dr. Orene Desanctis with unusual poorly differentiated cancer, no additional treatment  2015- recurrent mass in thyroid bed. PET-CT showed FDG avid lesion in thyroid bed + nasopharyx.  2015- Dr. 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 Margins not noted [facility], [place], #[id number]).   01/05/2014- Nasopharyngeal biopsy negative for malignancy  04/25/14- completed postoperative XRT completing 65.25 CGy in 29 fractions  05/2014 + 07/2014- PET avidity at the left nasopharynx and left oropharyngeal soft tissue as well as a hypermetabolic 1.5 cm mass in the right thyroid bed. Dr Johney Frame excised the paratracheal mass on 09/02/2014. Inspect of the nasopharynx at the time of surgery revealed no obvious abnormality and no biopsy was completed.   09/2014- NP avid area biopsied and negative for carcinoma. SHe underwent a thyroid FNA and that was negative for cancer.  # Chronic Anemia- IDA [ EGD- hiatal hernia; Jan 2017- Dr.Rein] AUG 2017- IV Venofer  # AUG 2017- Thyroglobulin 40/Sep 2017 US neck- NED  # AUG 2017- IgG kappa 0.4gm//N-kappa-Lamda light chains       Malignant neoplasm of thyroid gland ([place])   09/01/2014 Initial Diagnosis    Malignant neoplasm of thyroid gland ([place])        INTERVAL HISTORY:  Sara [facility] 75 y.o.  female pleasant patient above history of thyroid cancer; and also long-standing history of anemia of unclear etiology is here for follow-up. Patient noted to have iron deficiency currently on IV iron.  Patient energy levels have improved however not back to baseline. Denies any  lumps or bumps. Fatigue and shortness of breath with exertion is improved. Denies any swelling in the legs.  REVIEW OF SYSTEMS:  A complete 10 point review of system is done which is negative except mentioned above/history of present illness.   PAST MEDICAL HISTORY :  Past Medical History:  Diagnosis Date  . Anemia   . Asthma   . GERD (gastroesophageal reflux disease)   . Hyperlipidemia   . Hypertension   . Thyroid cancer ([place]) 1999 and 2015   Partial thyroidectomy with rad tx's.     PAST SURGICAL HISTORY :   Past Surgical History:  Procedure Laterality Date  . ABDOMINAL HYSTERECTOMY  1973   partial  . ESOPHAGOGASTRODUODENOSCOPY (EGD) WITH PROPOFOL N/A 11/16/2015   Procedure: ESOPHAGOGASTRODUODENOSCOPY (EGD) WITH PROPOFOL;  Surgeon: Josefine Class, MD;  Location: [facility] ENDOSCOPY;  Service: Endoscopy;  Laterality: N/A;  . THYROID SURGERY  1999 and 2015   Partial Thyroidectomy    FAMILY HISTORY :   Family History  Problem Relation Age of Onset  . Lung cancer Father   . Hypertension Father   . Bone cancer Sister   . Hypertension Sister   . Lung cancer Brother   . Hypertension Brother   . Kidney cancer Sister   . Hypertension Sister   . Lung cancer Sister   . Hypertension Sister     SOCIAL HISTORY:   Social History  Substance Use Topics  . Smoking status: Never Smoker  . Smokeless tobacco: Never Used  . Alcohol use No    ALLERGIES:  has No Known Allergies.  MEDICATIONS:  Current Outpatient Prescriptions  Medication Sig Dispense Refill  . albuterol (PROVENTIL HFA;VENTOLIN HFA) 108 (90 Base) MCG/ACT inhaler Inhale into the lungs every 6 (six) hours as needed for wheezing or shortness of breath.    . allopurinol (ZYLOPRIM) 100 MG tablet Take 100 mg by mouth daily.    Marland Kitchen amLODipine (NORVASC) 5 MG tablet Take 1 tablet by mouth.    Marland Kitchen azelastine (ASTELIN) 0.1 % nasal spray Place 1 spray into both nostrils daily as needed for rhinitis or allergies.     .  budesonide-formoterol (SYMBICORT) 160-4.5 MCG/ACT inhaler Inhale 2 puffs into the lungs 2 (two) times daily.    . bumetanide (BUMEX) 1 MG tablet Take 1 mg by mouth daily.    . calcium carbonate (CALCIUM 600) 600 MG TABS tablet Take 600 mg by mouth 2 (two) times daily with a meal.    . hydrocortisone (CORTEF) 20 MG tablet Take 1 tablet by mouth.    . levothyroxine (SYNTHROID, LEVOTHROID) 50 MCG tablet Take 1 tablet by mouth.    Marland Kitchen lisinopril (PRINIVIL,ZESTRIL) 20 MG tablet Take 20 mg by mouth daily.    Marland Kitchen LORazepam (ATIVAN) 0.5 MG tablet Take 1 tablet by mouth.    . meclizine (ANTIVERT) 12.5 MG tablet Take 1 tablet by mouth.    . montelukast (SINGULAIR) 10 MG tablet Take 10 mg by mouth at bedtime.    . NON FORMULARY Take by mouth 1 day or 1 dose. URICEL 0.25 teaspoons daily    . omeprazole (PRILOSEC) 40 MG capsule Take 40 mg by mouth daily.    . potassium chloride SA (KLOR-CON M20) 20 MEQ tablet Take 1 tablet by mouth.    . pravastatin (PRAVACHOL) 40 MG tablet Take 40 mg by mouth daily.    . propranolol (INDERAL) 20 MG tablet Take 20 mg by mouth 3 (three) times daily.    . traMADol (ULTRAM) 50 MG tablet Take 50 mg by mouth every 6 (six) hours as needed.    . Turmeric 500 MG TABS Take 1 tablet by mouth daily.     No current facility-administered medications for this visit.     PHYSICAL EXAMINATION: ECOG PERFORMANCE STATUS:   BP 125/74 (Patient Position: Sitting)   Pulse 69   Temp 97.1 F (36.2 C) (Tympanic)   Resp (!) 22   Ht '5\' 7"'  (1.702 m)   Wt 205 lb (93 kg)   BMI 32.11 kg/m   Filed Weights   07/27/16 1137  Weight: 205 lb (93 kg)    GENERAL: Well-nourished well-developed; Alert, no distress and comfortable.   EYES: no pallor or icterus OROPHARYNX: no thrush or ulceration; good dentition  NECK: supple, no masses felt; Scar from thyroid surgery noted. LYMPH:  no palpable lymphadenopathy in the cervical, axillary or inguinal regions LUNGS: clear to auscultation and  No wheeze  or crackles HEART/CVS: regular rate & rhythm and no murmurs; No lower extremity edema ABDOMEN:abdomen soft, non-tender and normal bowel sounds Musculoskeletal:no cyanosis of digits and no clubbing  PSYCH: alert & oriented x 3 with fluent speech NEURO: no focal motor/sensory deficits SKIN:  no rashes or significant lesions  LABORATORY DATA:  I have reviewed the data as listed    Component Value Date/Time   NA 136 07/25/2016 1106   K 4.0 07/25/2016 1106   CL 105 07/25/2016 1106   CO2 25 07/25/2016 1106   GLUCOSE 113 (H) 07/25/2016 1106   BUN 24 (H) 07/25/2016 1106   CREATININE 1.24 (H) 07/25/2016 1106  CALCIUM 9.2 07/25/2016 1106   PROT 7.5 07/25/2016 1106   ALBUMIN 4.0 07/25/2016 1106   AST 19 07/25/2016 1106   ALT 9 (L) 07/25/2016 1106   ALKPHOS 81 07/25/2016 1106   BILITOT 0.4 07/25/2016 1106   GFRNONAA 41 (L) 07/25/2016 1106   GFRAA 48 (L) 07/25/2016 1106    No results found for: SPEP, UPEP  Lab Results  Component Value Date   WBC 5.8 07/25/2016   NEUTROABS 4.7 07/25/2016   HGB 9.2 (L) 07/25/2016   HCT 27.7 (L) 07/25/2016   MCV 90.2 07/25/2016   PLT 301 07/25/2016      Chemistry      Component Value Date/Time   NA 136 07/25/2016 1106   K 4.0 07/25/2016 1106   CL 105 07/25/2016 1106   CO2 25 07/25/2016 1106   BUN 24 (H) 07/25/2016 1106   CREATININE 1.24 (H) 07/25/2016 1106      Component Value Date/Time   CALCIUM 9.2 07/25/2016 1106   ALKPHOS 81 07/25/2016 1106   AST 19 07/25/2016 1106   ALT 9 (L) 07/25/2016 1106   BILITOT 0.4 07/25/2016 1106       RADIOGRAPHIC STUDIES: I have personally reviewed the radiological images as listed and agreed with the findings in the report. No results found.   ASSESSMENT & PLAN:  Malignant neoplasm of thyroid gland ([place]) # thyroid ca with local reurence s/p surgery '[]' [facility]; EBRT [2015; no RAI]. TSH- N;  thyroglobulin levels- slightly up at 40.5 US- neck 86m nodule/ not concerning.   # IDA on IV venofer 3/6-  improving energy levels. Hemoglobin today 9.6. Unclear etiology- continue IV iron 4 more treatments. If not improvement and recommend bone marrow biopsy.  # MGUS- IgGKappa- 0.4gm; N- K/L. discussed the importance/monitor for now.  # follow up in 6 weeks or so/labs. CBC prior.   No orders of the defined types were placed in this encounter.  All questions were answered. The patient knows to call the clinic with any problems, questions or concerns.      GCammie Sickle MD 07/27/2016 6:54 PM

## 2016-08-03 ENCOUNTER — Inpatient Hospital Stay: Payer: Medicare Other

## 2016-08-03 VITALS — BP 112/73 | HR 63 | Temp 97.0°F | Resp 18

## 2016-08-03 DIAGNOSIS — Z85850 Personal history of malignant neoplasm of thyroid: Secondary | ICD-10-CM | POA: Diagnosis not present

## 2016-08-03 DIAGNOSIS — D508 Other iron deficiency anemias: Secondary | ICD-10-CM

## 2016-08-03 MED ORDER — SODIUM CHLORIDE 0.9 % IV SOLN
Freq: Once | INTRAVENOUS | Status: AC
Start: 2016-08-03 — End: 2017-02-05
  Administered 2017-02-05: 11:00:00 via INTRAVENOUS
  Filled 2016-08-03: qty 1000

## 2016-08-03 MED ORDER — SODIUM CHLORIDE 0.9 % IV SOLN: 200.0000 mg | Freq: Once | INTRAVENOUS | Status: DC

## 2016-08-03 MED ORDER — IRON SUCROSE 20 MG/ML IV SOLN
200.0000 mg | Freq: Once | INTRAVENOUS | Status: AC
  Administered 2016-08-03: 200 mg via INTRAVENOUS
  Filled 2016-08-03 (×6): qty 10

## 2016-08-10 ENCOUNTER — Inpatient Hospital Stay: Payer: Medicare Other

## 2016-08-10 VITALS — BP 133/72 | HR 76 | Temp 97.6°F | Resp 18

## 2016-08-10 DIAGNOSIS — D508 Other iron deficiency anemias: Secondary | ICD-10-CM

## 2016-08-10 DIAGNOSIS — Z85850 Personal history of malignant neoplasm of thyroid: Secondary | ICD-10-CM | POA: Diagnosis not present

## 2016-08-10 MED ORDER — SODIUM CHLORIDE 0.9 % IV SOLN
Freq: Once | INTRAVENOUS | Status: AC
Start: 2016-08-10 — End: 2016-08-10
  Administered 2016-08-10: 10:00:00 via INTRAVENOUS
  Filled 2016-08-10: qty 1000

## 2016-08-10 MED ORDER — IRON SUCROSE 20 MG/ML IV SOLN
200.0000 mg | Freq: Once | INTRAVENOUS | Status: AC
Start: 2016-08-10 — End: 2016-08-10
  Administered 2016-08-10: 200 mg via INTRAVENOUS
  Filled 2016-08-10 (×2): qty 10

## 2016-08-10 MED ORDER — SODIUM CHLORIDE 0.9 % IV SOLN: 200.0000 mg | Freq: Once | INTRAVENOUS | Status: DC

## 2016-08-17 ENCOUNTER — Inpatient Hospital Stay: Payer: Medicare Other

## 2016-08-17 DIAGNOSIS — Z85850 Personal history of malignant neoplasm of thyroid: Secondary | ICD-10-CM | POA: Diagnosis not present

## 2016-08-17 DIAGNOSIS — D508 Other iron deficiency anemias: Secondary | ICD-10-CM

## 2016-08-17 MED ORDER — SODIUM CHLORIDE 0.9 % IV SOLN
Freq: Once | INTRAVENOUS | Status: AC
  Administered 2016-08-17: 11:00:00 via INTRAVENOUS
  Filled 2016-08-17: qty 1000

## 2016-08-17 MED ORDER — IRON SUCROSE 20 MG/ML IV SOLN
200.0000 mg | Freq: Once | INTRAVENOUS | Status: AC
  Administered 2016-08-17: 200 mg via INTRAVENOUS
  Filled 2016-08-17: qty 10

## 2016-08-17 MED ORDER — SODIUM CHLORIDE 0.9 % IV SOLN: 200.0000 mg | Freq: Once | INTRAVENOUS | Status: DC

## 2016-08-24 ENCOUNTER — Inpatient Hospital Stay: Payer: Medicare Other | Attending: Internal Medicine

## 2016-08-24 VITALS — BP 128/72 | HR 74 | Resp 20

## 2016-08-24 DIAGNOSIS — C73 Malignant neoplasm of thyroid gland: Secondary | ICD-10-CM | POA: Diagnosis not present

## 2016-08-24 DIAGNOSIS — Z79899 Other long term (current) drug therapy: Secondary | ICD-10-CM | POA: Diagnosis not present

## 2016-08-24 DIAGNOSIS — E785 Hyperlipidemia, unspecified: Secondary | ICD-10-CM | POA: Insufficient documentation

## 2016-08-24 DIAGNOSIS — D509 Iron deficiency anemia, unspecified: Secondary | ICD-10-CM | POA: Diagnosis not present

## 2016-08-24 DIAGNOSIS — Z9889 Other specified postprocedural states: Secondary | ICD-10-CM | POA: Insufficient documentation

## 2016-08-24 DIAGNOSIS — K219 Gastro-esophageal reflux disease without esophagitis: Secondary | ICD-10-CM | POA: Diagnosis not present

## 2016-08-24 DIAGNOSIS — Z801 Family history of malignant neoplasm of trachea, bronchus and lung: Secondary | ICD-10-CM | POA: Diagnosis not present

## 2016-08-24 DIAGNOSIS — J45909 Unspecified asthma, uncomplicated: Secondary | ICD-10-CM | POA: Insufficient documentation

## 2016-08-24 DIAGNOSIS — D508 Other iron deficiency anemias: Secondary | ICD-10-CM

## 2016-08-24 DIAGNOSIS — I10 Essential (primary) hypertension: Secondary | ICD-10-CM | POA: Insufficient documentation

## 2016-08-24 DIAGNOSIS — Z85850 Personal history of malignant neoplasm of thyroid: Secondary | ICD-10-CM | POA: Diagnosis present

## 2016-08-24 DIAGNOSIS — Z8051 Family history of malignant neoplasm of kidney: Secondary | ICD-10-CM | POA: Diagnosis not present

## 2016-08-24 MED ORDER — SODIUM CHLORIDE 0.9 % IV SOLN
Freq: Once | INTRAVENOUS | Status: AC
  Administered 2016-08-24: 11:00:00 via INTRAVENOUS
  Filled 2016-08-24: qty 1000

## 2016-08-24 MED ORDER — IRON SUCROSE 20 MG/ML IV SOLN
200.0000 mg | Freq: Once | INTRAVENOUS | Status: DC
Start: 2016-08-24 — End: 2016-08-24

## 2016-08-24 MED ORDER — IRON SUCROSE 20 MG/ML IV SOLN
200.0000 mg | Freq: Once | INTRAVENOUS | Status: AC
  Administered 2016-08-24: 200 mg via INTRAVENOUS
  Filled 2016-08-24: qty 10

## 2016-08-30 ENCOUNTER — Other Ambulatory Visit: Payer: Self-pay

## 2016-09-01 ENCOUNTER — Ambulatory Visit: Payer: Self-pay | Admitting: Internal Medicine

## 2016-09-01 ENCOUNTER — Ambulatory Visit: Payer: Self-pay

## 2016-09-06 ENCOUNTER — Other Ambulatory Visit: Payer: Self-pay | Admitting: Internal Medicine

## 2016-09-06 DIAGNOSIS — Z1231 Encounter for screening mammogram for malignant neoplasm of breast: Secondary | ICD-10-CM

## 2016-09-07 ENCOUNTER — Inpatient Hospital Stay: Payer: Medicare Other

## 2016-09-07 DIAGNOSIS — D509 Iron deficiency anemia, unspecified: Secondary | ICD-10-CM

## 2016-09-07 DIAGNOSIS — C73 Malignant neoplasm of thyroid gland: Secondary | ICD-10-CM | POA: Diagnosis not present

## 2016-09-07 LAB — CBC WITH DIFFERENTIAL/PLATELET
Basophils Absolute: 0 10*3/uL (ref 0–0.1)
Basophils Relative: 1 %
EOS PCT: 3 %
Eosinophils Absolute: 0.2 10*3/uL (ref 0–0.7)
HEMATOCRIT: 32 % — AB (ref 35.0–47.0)
Hemoglobin: 10.7 g/dL — ABNORMAL LOW (ref 12.0–16.0)
LYMPHS ABS: 0.4 10*3/uL — AB (ref 1.0–3.6)
LYMPHS PCT: 9 %
MCH: 31.7 pg (ref 26.0–34.0)
MCHC: 33.5 g/dL (ref 32.0–36.0)
MCV: 94.6 fL (ref 80.0–100.0)
MONO ABS: 0.4 10*3/uL (ref 0.2–0.9)
Monocytes Relative: 8 %
NEUTROS ABS: 4 10*3/uL (ref 1.4–6.5)
Neutrophils Relative %: 79 %
PLATELETS: 289 10*3/uL (ref 150–440)
RBC: 3.38 MIL/uL — AB (ref 3.80–5.20)
RDW: 18.1 % — AB (ref 11.5–14.5)
WBC: 5.1 10*3/uL (ref 3.6–11.0)

## 2016-09-07 MED ORDER — INFLUENZA VAC SPLIT QUAD 0.5 ML IM SUSY
PREFILLED_SYRINGE | INTRAMUSCULAR | Status: AC
  Filled 2016-09-07: qty 0.5

## 2016-09-14 ENCOUNTER — Inpatient Hospital Stay (HOSPITAL_BASED_OUTPATIENT_CLINIC_OR_DEPARTMENT_OTHER): Payer: Medicare Other | Admitting: Internal Medicine

## 2016-09-14 VITALS — BP 132/76 | HR 73 | Temp 97.9°F | Resp 18 | Wt 208.4 lb

## 2016-09-14 DIAGNOSIS — Z801 Family history of malignant neoplasm of trachea, bronchus and lung: Secondary | ICD-10-CM

## 2016-09-14 DIAGNOSIS — K219 Gastro-esophageal reflux disease without esophagitis: Secondary | ICD-10-CM

## 2016-09-14 DIAGNOSIS — C73 Malignant neoplasm of thyroid gland: Secondary | ICD-10-CM

## 2016-09-14 DIAGNOSIS — E785 Hyperlipidemia, unspecified: Secondary | ICD-10-CM

## 2016-09-14 DIAGNOSIS — I10 Essential (primary) hypertension: Secondary | ICD-10-CM

## 2016-09-14 DIAGNOSIS — Z79899 Other long term (current) drug therapy: Secondary | ICD-10-CM

## 2016-09-14 DIAGNOSIS — J45909 Unspecified asthma, uncomplicated: Secondary | ICD-10-CM

## 2016-09-14 DIAGNOSIS — D509 Iron deficiency anemia, unspecified: Secondary | ICD-10-CM

## 2016-09-14 DIAGNOSIS — Z9889 Other specified postprocedural states: Secondary | ICD-10-CM

## 2016-09-14 DIAGNOSIS — Z8051 Family history of malignant neoplasm of kidney: Secondary | ICD-10-CM

## 2016-09-14 NOTE — Progress Notes (Signed)
Patient is here for follow up, she is asking if her cancer is in her bones due to the amount of pain she is in.

## 2016-09-14 NOTE — Progress Notes (Signed)
[facility] OFF[facility] PROGRESS NOTE  Patient Care Team: Adin Hector, MD as PCP - General (Internal Medicine)  No matching staging information was found for the patient.   Oncology History   1999- right thyroid lobectomy by Dr. Orene Desanctis with unusual poorly differentiated cancer, no additional treatment  2015- recurrent mass in thyroid bed. PET-CT showed FDG avid lesion in thyroid bed + nasopharyx.  2015- Dr. 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 Margins not noted [facility], [place], #[id number]).   01/05/2014- Nasopharyngeal biopsy negative for malignancy  04/25/14- completed postoperative XRT completing 65.25 CGy in 29 fractions  05/2014 + 07/2014- PET avidity at the left nasopharynx and left oropharyngeal soft tissue as well as a hypermetabolic 1.5 cm mass in the right thyroid bed. Dr Sara Fields excised the paratracheal mass on 09/02/2014. Inspect of the nasopharynx at the time of surgery revealed no obvious abnormality and no biopsy was completed.   09/2014- NP avid area biopsied and negative for carcinoma. SHe underwent a thyroid FNA and that was negative for cancer.  # Chronic Anemia- IDA [ EGD- hiatal hernia; Jan 2017- Dr.Rein] AUG 2017- IV Venofer  # AUG 2017- Thyroglobulin 40/Sep 2017 US neck- NED  # AUG 2017- IgG kappa 0.4gm//N-kappa-Lamda light chains       Malignant neoplasm of thyroid gland ([place])   09/01/2014 Initial Diagnosis    Malignant neoplasm of thyroid gland ([place])        INTERVAL HISTORY:  Sara Fields 74 y.o.  female pleasant patient above history of thyroid cancer; and Recent diagnosis of iron deficiency anemia unclear etiology is here for follow-up.  Patient's energy levels have improved status post IV iron. Patient states iron pill a day. She complains of worsening pain in her  joints by late in the knees and also the hands. She is awaiting evaluation with orthopedics in the next few weeks. Denies any swelling in the legs. No nausea no vomiting. No chest pain or shortness of breath cough.  REVIEW OF SYSTEMS:  A complete 10 point review of system is done which is negative except mentioned above/history of present illness.   PAST MEDICAL HISTORY :  Past Medical History:  Diagnosis Date  . Anemia   . Asthma   . GERD (gastroesophageal reflux disease)   . Hyperlipidemia   . Hypertension   . Thyroid cancer ([place]) 1999 and 2015   Partial thyroidectomy with rad tx's.     PAST SURGICAL HISTORY :   Past Surgical History:  Procedure Laterality Date  . ABDOMINAL HYSTERECTOMY  1973   partial  . ESOPHAGOGASTRODUODENOSCOPY (EGD) WITH PROPOFOL N/A 11/16/2015   Procedure: ESOPHAGOGASTRODUODENOSCOPY (EGD) WITH PROPOFOL;  Surgeon: Josefine Class, MD;  Location: [facility] ENDOSCOPY;  Service: Endoscopy;  Laterality: N/A;  . THYROID SURGERY  1999 and 2015   Partial Thyroidectomy    FAMILY HISTORY :   Family History  Problem Relation Age of Onset  . Lung cancer Father   . Hypertension Father   . Bone cancer Sister   . Hypertension Sister   . Lung cancer Brother   . Hypertension Brother   . Kidney cancer Sister   . Hypertension Sister   . Lung cancer Sister   . Hypertension Sister     SOCIAL HISTORY:   Social History  Substance Use Topics  . Smoking status: Never Smoker  .  Smokeless tobacco: Never Used  . Alcohol use No    ALLERGIES:  has No Known Allergies.  MEDICATIONS:  Current Outpatient Prescriptions  Medication Sig Dispense Refill  . albuterol (PROVENTIL HFA;VENTOLIN HFA) 108 (90 Base) MCG/ACT inhaler Inhale into the lungs every 6 (six) hours as needed for wheezing or shortness of breath.    . allopurinol (ZYLOPRIM) 100 MG tablet Take 100 mg by mouth daily.    Marland Kitchen amLODipine (NORVASC) 5 MG tablet Take 1 tablet by mouth.    . budesonide-formoterol  (SYMBICORT) 160-4.5 MCG/ACT inhaler Inhale 2 puffs into the lungs 2 (two) times daily.    . bumetanide (BUMEX) 1 MG tablet Take 1 mg by mouth daily.    . calcium carbonate (CALCIUM 600) 600 MG TABS tablet Take 600 mg by mouth 2 (two) times daily with a meal.    . hydrocortisone (CORTEF) 20 MG tablet Take 1 tablet by mouth.    . levothyroxine (SYNTHROID, LEVOTHROID) 50 MCG tablet Take 1 tablet by mouth.    Marland Kitchen lisinopril (PRINIVIL,ZESTRIL) 20 MG tablet Take 20 mg by mouth daily.    Marland Kitchen LORazepam (ATIVAN) 0.5 MG tablet Take 1 tablet by mouth.    . meclizine (ANTIVERT) 12.5 MG tablet Take 1 tablet by mouth.    . montelukast (SINGULAIR) 10 MG tablet Take 10 mg by mouth at bedtime.    . NON FORMULARY Take by mouth 1 day or 1 dose. URICEL 0.25 teaspoons daily    . omeprazole (PRILOSEC) 40 MG capsule Take 40 mg by mouth daily.    . potassium chloride SA (KLOR-CON M20) 20 MEQ tablet Take 1 tablet by mouth.    . propranolol (INDERAL) 20 MG tablet Take 20 mg by mouth 3 (three) times daily.    . rosuvastatin (CRESTOR) 10 MG tablet Take by mouth.    . traMADol (ULTRAM) 50 MG tablet Take 50 mg by mouth every 6 (six) hours as needed.    . Turmeric 500 MG TABS Take 1 tablet by mouth daily.    Marland Kitchen azelastine (ASTELIN) 0.1 % nasal spray Place 1 spray into both nostrils daily as needed for rhinitis or allergies.      No current facility-administered medications for this visit.    Facility-Administered Medications Ordered in Other Visits  Medication Dose Route Frequency Provider Last Rate Last Dose  . 0.9 %  sodium chloride infusion   Intravenous Once Cammie Sickle, MD        PHYSICAL EXAMINATION: ECOG PERFORMANCE STATUS:   BP 132/76 (BP Location: Right Arm, Patient Position: Sitting)   Pulse 73   Temp 97.9 F (36.6 C) (Tympanic)   Resp 18   Wt 208 lb 6.4 oz (94.5 kg)   BMI 32.64 kg/m   Filed Weights   09/14/16 1043  Weight: 208 lb 6.4 oz (94.5 kg)    GENERAL: Well-nourished well-developed;  Alert, no distress and comfortable.   EYES: no pallor or icterus OROPHARYNX: no thrush or ulceration; good dentition  NECK: supple, no masses felt; Scar from thyroid surgery noted. LYMPH:  no palpable lymphadenopathy in the cervical, axillary or inguinal regions LUNGS: clear to auscultation and  No wheeze or crackles HEART/CVS: regular rate & rhythm and no murmurs; No lower extremity edema ABDOMEN:abdomen soft, non-tender and normal bowel sounds Musculoskeletal:no cyanosis of digits and no clubbing  PSYCH: alert & oriented x 3 with fluent speech NEURO: no focal motor/sensory deficits SKIN:  no rashes or significant lesions  LABORATORY DATA:  I have reviewed the  data as listed    Component Value Date/Time   NA 136 07/25/2016 1106   K 4.0 07/25/2016 1106   CL 105 07/25/2016 1106   CO2 25 07/25/2016 1106   GLUCOSE 113 (H) 07/25/2016 1106   BUN 24 (H) 07/25/2016 1106   CREATININE 1.24 (H) 07/25/2016 1106   CALCIUM 9.2 07/25/2016 1106   PROT 7.5 07/25/2016 1106   ALBUMIN 4.0 07/25/2016 1106   AST 19 07/25/2016 1106   ALT 9 (L) 07/25/2016 1106   ALKPHOS 81 07/25/2016 1106   BILITOT 0.4 07/25/2016 1106   GFRNONAA 41 (L) 07/25/2016 1106   GFRAA 48 (L) 07/25/2016 1106    No results found for: SPEP, UPEP  Lab Results  Component Value Date   WBC 5.1 09/07/2016   NEUTROABS 4.0 09/07/2016   HGB 10.7 (L) 09/07/2016   HCT 32.0 (L) 09/07/2016   MCV 94.6 09/07/2016   PLT 289 09/07/2016      Chemistry      Component Value Date/Time   NA 136 07/25/2016 1106   K 4.0 07/25/2016 1106   CL 105 07/25/2016 1106   CO2 25 07/25/2016 1106   BUN 24 (H) 07/25/2016 1106   CREATININE 1.24 (H) 07/25/2016 1106      Component Value Date/Time   CALCIUM 9.2 07/25/2016 1106   ALKPHOS 81 07/25/2016 1106   AST 19 07/25/2016 1106   ALT 9 (L) 07/25/2016 1106   BILITOT 0.4 07/25/2016 1106       RADIOGRAPHIC STUDIES: I have personally reviewed the radiological images as listed and agreed with  the findings in the report. No results found.   ASSESSMENT & PLAN:  Malignant neoplasm of thyroid gland ([place]) # thyroid ca with local reurence s/p surgery [] [facility]; EBRT [2015; no RAI]. TSH- N;  thyroglobulin levels- slightly up at 40.5 US- neck 10mm nodule/ not concerning. Will await bone scan for now. We will repeat thyroglobulin levels in 2 months.  # IDA/CKD- on IV 6 improving; hemoglobin 10.7 today; recommend PO iron pills BID.   # Bone pain/? Arthritis less likely from metastatic disease; if not improved-patient was asked to call us; I would recommend bone scan.   # MGUS- IgGKappa- 0.4gm; N- K/L. discussed the importance/monitor for now.  # follow up in 2 months/ labs prior/ possible venofer.    Orders Placed This Encounter  Procedures  . TgAb+Thyroglobulin IMA or RIA    Standing Status:   Future    Standing Expiration Date:   09/14/2017  . Thyroid Panel With TSH    Standing Status:   Future    Standing Expiration Date:   09/14/2017  . CBC with Differential    Standing Status:   Future    Standing Expiration Date:   09/14/2017  . Basic metabolic panel    Standing Status:   Future    Standing Expiration Date:   09/14/2017  . Ferritin    Standing Status:   Future    Standing Expiration Date:   09/14/2017  . Iron and TIBC    Standing Status:   Future    Standing Expiration Date:   09/14/2017   All questions were answered. The patient knows to call the clinic with any problems, questions or concerns.      Cammie Sickle, MD 09/14/2016 5:33 PM

## 2016-09-14 NOTE — Assessment & Plan Note (Addendum)
#   thyroid ca with local reurence s/p surgery [] [facility]; EBRT [2015; no RAI]. TSH- N;  thyroglobulin levels- slightly up at 40.5 US- neck 67mm nodule/ not concerning. Will await bone scan for now. We will repeat thyroglobulin levels in 2 months.  # IDA/CKD- on IV 6 improving; hemoglobin 10.7 today; recommend PO iron pills BID.   # Bone pain/? Arthritis less likely from metastatic disease; if not improved-patient was asked to call us; I would recommend bone scan.   # MGUS- IgGKappa- 0.4gm; N- K/L. discussed the importance/monitor for now.  # follow up in 2 months/ labs prior/ possible venofer.

## 2016-10-13 ENCOUNTER — Ambulatory Visit
Admission: RE | Admit: 2016-10-13 | Discharge: 2016-10-13 | Disposition: A | Discharge status: Home / Self Care | Payer: Medicare Other | Source: Ambulatory Visit | Attending: Internal Medicine | Admitting: Internal Medicine

## 2016-10-13 DIAGNOSIS — Z1231 Encounter for screening mammogram for malignant neoplasm of breast: Secondary | ICD-10-CM | POA: Insufficient documentation

## 2016-11-07 ENCOUNTER — Inpatient Hospital Stay: Payer: Medicare Other | Attending: Internal Medicine

## 2016-11-07 ENCOUNTER — Encounter: Payer: Self-pay | Admitting: Internal Medicine

## 2016-11-07 DIAGNOSIS — R490 Dysphonia: Secondary | ICD-10-CM | POA: Diagnosis not present

## 2016-11-07 DIAGNOSIS — C73 Malignant neoplasm of thyroid gland: Secondary | ICD-10-CM | POA: Diagnosis not present

## 2016-11-07 DIAGNOSIS — I10 Essential (primary) hypertension: Secondary | ICD-10-CM | POA: Diagnosis not present

## 2016-11-07 DIAGNOSIS — M898X0 Other specified disorders of bone, multiple sites: Secondary | ICD-10-CM | POA: Insufficient documentation

## 2016-11-07 DIAGNOSIS — D472 Monoclonal gammopathy: Secondary | ICD-10-CM | POA: Diagnosis not present

## 2016-11-07 DIAGNOSIS — K449 Diaphragmatic hernia without obstruction or gangrene: Secondary | ICD-10-CM | POA: Insufficient documentation

## 2016-11-07 DIAGNOSIS — Z923 Personal history of irradiation: Secondary | ICD-10-CM | POA: Diagnosis not present

## 2016-11-07 DIAGNOSIS — Z808 Family history of malignant neoplasm of other organs or systems: Secondary | ICD-10-CM | POA: Diagnosis not present

## 2016-11-07 DIAGNOSIS — K219 Gastro-esophageal reflux disease without esophagitis: Secondary | ICD-10-CM | POA: Insufficient documentation

## 2016-11-07 DIAGNOSIS — Z79899 Other long term (current) drug therapy: Secondary | ICD-10-CM | POA: Insufficient documentation

## 2016-11-07 DIAGNOSIS — Z8051 Family history of malignant neoplasm of kidney: Secondary | ICD-10-CM | POA: Insufficient documentation

## 2016-11-07 DIAGNOSIS — N189 Chronic kidney disease, unspecified: Secondary | ICD-10-CM | POA: Diagnosis not present

## 2016-11-07 DIAGNOSIS — E785 Hyperlipidemia, unspecified: Secondary | ICD-10-CM | POA: Insufficient documentation

## 2016-11-07 DIAGNOSIS — I129 Hypertensive chronic kidney disease with stage 1 through stage 4 chronic kidney disease, or unspecified chronic kidney disease: Secondary | ICD-10-CM | POA: Diagnosis not present

## 2016-11-07 DIAGNOSIS — D509 Iron deficiency anemia, unspecified: Secondary | ICD-10-CM | POA: Diagnosis not present

## 2016-11-07 DIAGNOSIS — J45909 Unspecified asthma, uncomplicated: Secondary | ICD-10-CM | POA: Insufficient documentation

## 2016-11-07 DIAGNOSIS — Z90710 Acquired absence of both cervix and uterus: Secondary | ICD-10-CM | POA: Insufficient documentation

## 2016-11-07 DIAGNOSIS — Z800 Family history of malignant neoplasm of digestive organs: Secondary | ICD-10-CM | POA: Insufficient documentation

## 2016-11-07 LAB — IRON AND TIBC
Iron: 71 ug/dL (ref 28–170)
Saturation Ratios: 22 % (ref 10.4–31.8)
TIBC: 324 ug/dL (ref 250–450)
UIBC: 253 ug/dL

## 2016-11-07 LAB — BASIC METABOLIC PANEL
ANION GAP: 9 (ref 5–15)
BUN: 20 mg/dL (ref 6–20)
CALCIUM: 9 mg/dL (ref 8.9–10.3)
CO2: 27 mmol/L (ref 22–32)
Chloride: 101 mmol/L (ref 101–111)
Creatinine, Ser: 1.55 mg/dL — ABNORMAL HIGH (ref 0.44–1.00)
GFR calc Af Amer: 37 mL/min — ABNORMAL LOW (ref >60)
GFR, EST NON AFRICAN AMERICAN: 32 mL/min — AB (ref >60)
GLUCOSE: 133 mg/dL — AB (ref 65–99)
Potassium: 3.5 mmol/L (ref 3.5–5.1)
Sodium: 137 mmol/L (ref 135–145)

## 2016-11-07 LAB — CBC WITH DIFFERENTIAL/PLATELET
BASOS ABS: 0.1 10*3/uL (ref 0–0.1)
Basophils Relative: 1 %
EOS PCT: 4 %
Eosinophils Absolute: 0.2 10*3/uL (ref 0–0.7)
HEMATOCRIT: 34.4 % — AB (ref 35.0–47.0)
Hemoglobin: 11.5 g/dL — ABNORMAL LOW (ref 12.0–16.0)
LYMPHS ABS: 0.7 10*3/uL — AB (ref 1.0–3.6)
LYMPHS PCT: 16 %
MCH: 31.8 pg (ref 26.0–34.0)
MCHC: 33.5 g/dL (ref 32.0–36.0)
MCV: 94.8 fL (ref 80.0–100.0)
MONO ABS: 0.4 10*3/uL (ref 0.2–0.9)
MONOS PCT: 9 %
NEUTROS ABS: 3.1 10*3/uL (ref 1.4–6.5)
Neutrophils Relative %: 70 %
PLATELETS: 323 10*3/uL (ref 150–440)
RBC: 3.62 MIL/uL — ABNORMAL LOW (ref 3.80–5.20)
RDW: 14.5 % (ref 11.5–14.5)
WBC: 4.4 10*3/uL (ref 3.6–11.0)

## 2016-11-07 LAB — FERRITIN: Ferritin: 370 ng/mL — ABNORMAL HIGH (ref 11–307)

## 2016-11-14 ENCOUNTER — Inpatient Hospital Stay: Payer: Medicare Other

## 2016-11-14 ENCOUNTER — Inpatient Hospital Stay (HOSPITAL_BASED_OUTPATIENT_CLINIC_OR_DEPARTMENT_OTHER): Payer: Medicare Other | Admitting: Internal Medicine

## 2016-11-14 DIAGNOSIS — N189 Chronic kidney disease, unspecified: Secondary | ICD-10-CM | POA: Diagnosis not present

## 2016-11-14 DIAGNOSIS — Z808 Family history of malignant neoplasm of other organs or systems: Secondary | ICD-10-CM

## 2016-11-14 DIAGNOSIS — Z800 Family history of malignant neoplasm of digestive organs: Secondary | ICD-10-CM

## 2016-11-14 DIAGNOSIS — J45909 Unspecified asthma, uncomplicated: Secondary | ICD-10-CM

## 2016-11-14 DIAGNOSIS — I129 Hypertensive chronic kidney disease with stage 1 through stage 4 chronic kidney disease, or unspecified chronic kidney disease: Secondary | ICD-10-CM

## 2016-11-14 DIAGNOSIS — Z79899 Other long term (current) drug therapy: Secondary | ICD-10-CM

## 2016-11-14 DIAGNOSIS — E785 Hyperlipidemia, unspecified: Secondary | ICD-10-CM

## 2016-11-14 DIAGNOSIS — K449 Diaphragmatic hernia without obstruction or gangrene: Secondary | ICD-10-CM

## 2016-11-14 DIAGNOSIS — D509 Iron deficiency anemia, unspecified: Secondary | ICD-10-CM | POA: Diagnosis not present

## 2016-11-14 DIAGNOSIS — R490 Dysphonia: Secondary | ICD-10-CM

## 2016-11-14 DIAGNOSIS — Z8051 Family history of malignant neoplasm of kidney: Secondary | ICD-10-CM

## 2016-11-14 DIAGNOSIS — C73 Malignant neoplasm of thyroid gland: Secondary | ICD-10-CM

## 2016-11-14 DIAGNOSIS — M898X0 Other specified disorders of bone, multiple sites: Secondary | ICD-10-CM

## 2016-11-14 DIAGNOSIS — K219 Gastro-esophageal reflux disease without esophagitis: Secondary | ICD-10-CM

## 2016-11-14 DIAGNOSIS — Z90710 Acquired absence of both cervix and uterus: Secondary | ICD-10-CM

## 2016-11-14 DIAGNOSIS — Z923 Personal history of irradiation: Secondary | ICD-10-CM

## 2016-11-14 DIAGNOSIS — D472 Monoclonal gammopathy: Secondary | ICD-10-CM

## 2016-11-14 DIAGNOSIS — I10 Essential (primary) hypertension: Secondary | ICD-10-CM

## 2016-11-14 NOTE — Progress Notes (Signed)
Patient here today for follow up.  Patient is concerned about difficulty with talking, voice is hoarse

## 2016-11-14 NOTE — Progress Notes (Signed)
[facility] OFFICE PROGRESS NOTE  Patient Care Team: Adin Hector, MD as PCP - General (Internal Medicine)  Cancer Staging No matching staging information was found for the patient.   Oncology History   1999- right thyroid lobectomy by Dr. Orene Desanctis with unusual poorly differentiated cancer, no additional treatment  2015- recurrent mass in thyroid bed. PET-CT showed FDG avid lesion in thyroid bed + nasopharyx.  2015- Dr. 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 Margins not noted [facility], [place], #[id number]).   01/05/2014- Nasopharyngeal biopsy negative for malignancy  04/25/14- completed postoperative XRT completing 65.25 CGy in 29 fractions  05/2014 + 07/2014- PET avidity at the left nasopharynx and left oropharyngeal soft tissue as well as a hypermetabolic 1.5 cm mass in the right thyroid bed. Dr Johney Frame excised the paratracheal mass on 09/02/2014. Inspect of the nasopharynx at the time of surgery revealed no obvious abnormality and no biopsy was completed.   09/2014- NP avid area biopsied and negative for carcinoma. SHe underwent a thyroid FNA and that was negative for cancer.  # Chronic Anemia- IDA [ EGD- hiatal hernia; Jan 2017- Dr.Rein] AUG 2017- IV Venofer  # AUG 2017- Thyroglobulin 40/Sep 2017 US neck- NED  # AUG 2017- IgG kappa 0.4gm//N-kappa-Lamda light chains       Malignant neoplasm of thyroid gland ([place])   09/01/2014 Initial Diagnosis    Malignant neoplasm of thyroid gland ([place])        INTERVAL HISTORY:  Sara Fields 76 y.o.  female pleasant patient above history of thyroid cancer; and diagnosis of iron deficiency anemia unclear etiology is here for follow-up.  Patient's energy levels have improved dramatically post IV iron. Patient continues to take iron daily. She complains  continued worsening pain in her joints. She was evaluated by ortho where she was given an injection in the right knee which helped tremendously. She denies any swelling in the legs. No nausea no vomiting. No chest pain or shortness of breath cough. She does complain of worsening "horseness". She states that sometimes she is unable to finish a sentence because her voice "gives out".  REVIEW OF SYSTEMS:  A complete 10 point review of system is done which is negative except mentioned above/history of present illness.   PAST MEDICAL HISTORY :  Past Medical History:  Diagnosis Date  . Anemia   . Asthma   . GERD (gastroesophageal reflux disease)   . Hyperlipidemia   . Hypertension   . Thyroid cancer ([place]) 1999 and 2015   Partial thyroidectomy with rad tx's.     PAST SURGICAL HISTORY :   Past Surgical History:  Procedure Laterality Date  . ABDOMINAL HYSTERECTOMY  1973   partial  . ESOPHAGOGASTRODUODENOSCOPY (EGD) WITH PROPOFOL N/A 11/16/2015   Procedure: ESOPHAGOGASTRODUODENOSCOPY (EGD) WITH PROPOFOL;  Surgeon: Josefine Class, MD;  Location[facility] ENDOSCOPY;  Service: Endoscopy;  Laterality: N/A;  . THYROID SURGERY  1999 and 2015   Partial Thyroidectomy    FAMILY HISTORY :   Family History  Problem Relation Age of Onset  . Lung cancer Father   . Hypertension Father   . Bone cancer Sister   . Hypertension Sister   . Lung cancer Brother   . Hypertension Brother   . Kidney cancer Sister   . Hypertension Sister   . Lung cancer Sister   .  Hypertension Sister   . Breast cancer Neg Hx     SOCIAL HISTORY:   Social History  Substance Use Topics  . Smoking status: Never Smoker  . Smokeless tobacco: Never Used  . Alcohol use No    ALLERGIES:  has No Known Allergies.  MEDICATIONS:  Current Outpatient Prescriptions  Medication Sig Dispense Refill  . albuterol (PROVENTIL HFA;VENTOLIN HFA) 108 (90 Base) MCG/ACT inhaler Inhale into the lungs every 6 (six) hours as needed for  wheezing or shortness of breath.    . allopurinol (ZYLOPRIM) 100 MG tablet Take 100 mg by mouth daily.    Marland Kitchen amLODipine (NORVASC) 5 MG tablet Take 1 tablet by mouth.    Marland Kitchen azelastine (ASTELIN) 0.1 % nasal spray Place 1 spray into both nostrils daily as needed for rhinitis or allergies.     . budesonide-formoterol (SYMBICORT) 160-4.5 MCG/ACT inhaler Inhale 2 puffs into the lungs 2 (two) times daily.    . bumetanide (BUMEX) 1 MG tablet Take 1 mg by mouth daily.    . calcium carbonate (CALCIUM 600) 600 MG TABS tablet Take 600 mg by mouth 2 (two) times daily with a meal.    . furosemide (LASIX) 20 MG tablet Take 20 mg by mouth daily.    . hydrocortisone (CORTEF) 20 MG tablet Take 1 tablet by mouth.    . levothyroxine (SYNTHROID, LEVOTHROID) 50 MCG tablet Take 50 mcg by mouth.     Marland Kitchen lisinopril (PRINIVIL,ZESTRIL) 20 MG tablet Take 20 mg by mouth daily.    Marland Kitchen LORazepam (ATIVAN) 0.5 MG tablet Take 1 tablet by mouth.    . meclizine (ANTIVERT) 12.5 MG tablet Take 1 tablet by mouth.    . montelukast (SINGULAIR) 10 MG tablet Take 10 mg by mouth at bedtime.    . NON FORMULARY Take by mouth 1 day or 1 dose. URICEL 0.25 teaspoons daily    . omeprazole (PRILOSEC) 40 MG capsule Take 40 mg by mouth daily.    . potassium chloride SA (KLOR-CON M20) 20 MEQ tablet Take 1 tablet by mouth.    . propranolol (INDERAL) 20 MG tablet Take 20 mg by mouth 3 (three) times daily.    . rosuvastatin (CRESTOR) 10 MG tablet Take 10 mg by mouth daily.     Marland Kitchen tiZANidine (ZANAFLEX) 4 MG tablet Take 4 mg by mouth as needed.    . traMADol (ULTRAM) 50 MG tablet Take 50 mg by mouth every 6 (six) hours as needed.    . Turmeric 500 MG TABS Take 1 tablet by mouth daily.     No current facility-administered medications for this visit.    Facility-Administered Medications Ordered in Other Visits  Medication Dose Route Frequency Provider Last Rate Last Dose  . 0.9 %  sodium chloride infusion   Intravenous Once Cammie Sickle, MD         PHYSICAL EXAMINATION: ECOG PERFORMANCE STATUS:   BP 115/74 (BP Location: Left Arm, Patient Position: Sitting)   Pulse 76   Temp 97.2 F (36.2 C) (Tympanic)   Wt 204 lb (92.5 kg)   BMI 31.95 kg/m   Filed Weights   11/14/16 1350  Weight: 204 lb (92.5 kg)    GENERAL: Well-nourished well-developed; Alert, no distress and comfortable.   EYES: no pallor or icterus OROPHARYNX: no thrush or ulceration; good dentition  NECK: supple, no masses felt; Scar from thyroid surgery noted. LYMPH:  no palpable lymphadenopathy in the cervical, axillary or inguinal regions LUNGS: clear to auscultation and  No  wheeze or crackles HEART/CVS: regular rate & rhythm and no murmurs; No lower extremity edema ABDOMEN:abdomen soft, non-tender and normal bowel sounds Musculoskeletal:no cyanosis of digits and no clubbing  PSYCH: alert & oriented x 3 with fluent speech NEURO: no focal motor/sensory deficits SKIN:  no rashes or significant lesions  LABORATORY DATA:  I have reviewed the data as listed    Component Value Date/Time   NA 137 11/07/2016 1003   K 3.5 11/07/2016 1003   CL 101 11/07/2016 1003   CO2 27 11/07/2016 1003   GLUCOSE 133 (H) 11/07/2016 1003   BUN 20 11/07/2016 1003   CREATININE 1.55 (H) 11/07/2016 1003   CALCIUM 9.0 11/07/2016 1003   PROT 7.5 07/25/2016 1106   ALBUMIN 4.0 07/25/2016 1106   AST 19 07/25/2016 1106   ALT 9 (L) 07/25/2016 1106   ALKPHOS 81 07/25/2016 1106   BILITOT 0.4 07/25/2016 1106   GFRNONAA 32 (L) 11/07/2016 1003   GFRAA 37 (L) 11/07/2016 1003    No results found for: SPEP, UPEP  Lab Results  Component Value Date   WBC 4.4 11/07/2016   NEUTROABS 3.1 11/07/2016   HGB 11.5 (L) 11/07/2016   HCT 34.4 (L) 11/07/2016   MCV 94.8 11/07/2016   PLT 323 11/07/2016      Chemistry      Component Value Date/Time   NA 137 11/07/2016 1003   K 3.5 11/07/2016 1003   CL 101 11/07/2016 1003   CO2 27 11/07/2016 1003   BUN 20 11/07/2016 1003   CREATININE 1.55  (H) 11/07/2016 1003      Component Value Date/Time   CALCIUM 9.0 11/07/2016 1003   ALKPHOS 81 07/25/2016 1106   AST 19 07/25/2016 1106   ALT 9 (L) 07/25/2016 1106   BILITOT 0.4 07/25/2016 1106       RADIOGRAPHIC STUDIES: I have personally reviewed the radiological images as listed and agreed with the findings in the report. No results found.   ASSESSMENT & PLAN:  Malignant neoplasm of thyroid gland ([place]) # thyroid ca with local reurence s/p surgery [] [facility]; EBRT [2015; no RAI]. TSH- N;  thyroglobulin levels- pending at this time. Slightly up at 40.5 in August 2017.US- neck 37mm nodule/ not concerning.    # IDA/CKD- Stable. Hb today 11.7 today; recommend to continue PO iron pills BID. Does not need IV Venofer today. Will re-check CBC in 6 weeks for possible Venofer.   # Persistent Bone pain: Continued bone pain in all large joints. Recommend bone scan. Ordered NM Bone Scan. Saw ortho and received a joint injection in right knee which helped. Continue to follow up with ortho.  # Horsness of voice: Recommend a referral to [facility] ENT. Referral made.   # MGUS- IgGKappa- 0.4gm in August 2017. Will re-draw in 6 weeks to monitor progress.  # follow up in 6 weeks/ labs prior/ possible venofer/review of scan.   Orders Placed This Encounter  Procedures  . NM Bone Scan Whole Body    Standing Status:   Future    Standing Expiration Date:   11/14/2017    Order Specific Question:   Reason for Exam (SYMPTOM  OR DIAGNOSIS REQUIRED)    Answer:   Hx of thyroid cancer and bone pain    Order Specific Question:   Preferred imaging location?    Answer:   [facility]    Order Specific Question:   If indicated for the ordered procedure, I authorize the administration of a radiopharmaceutical per Radiology protocol  Answer:   Yes  . CBC with Differential    Standing Status:   Future    Standing Expiration Date:   11/14/2017  . Kappa/lambda light chains    Standing Status:   Future     Standing Expiration Date:   11/14/2017  . Ambulatory referral to ENT    Referral Priority:   Routine    Referral Type:   Consultation    Referral Reason:   Specialty Services Required    Requested Specialty:   Otolaryngology    Number of Visits Requested:   1   All questions were answered. The patient knows to call the clinic with any problems, questions or concerns.   Faythe Casa, NP    Cammie Sickle, MD 11/15/2016 9:22 AM

## 2016-11-14 NOTE — Assessment & Plan Note (Addendum)
#   thyroid ca with local reurence s/p surgery [] [facility]; EBRT [2015; no RAI]. TSH- N;  thyroglobulin levels- pending at this time. Slightly up at 40.5 in August 2017.US- neck 34mm nodule/ not concerning.    # IDA/CKD- Stable. Hb today 11.7 today; recommend to continue PO iron pills BID. Does not need IV Venofer today. Will re-check CBC in 6 weeks for possible Venofer.   # Persistent Bone pain: Continued bone pain in all large joints. Recommend bone scan. Ordered NM Bone Scan. Saw ortho and received a joint injection in right knee which helped. Continue to follow up with ortho.  # Horsness of voice: Recommend a referral to [facility] ENT. Referral made.   # MGUS- IgGKappa- 0.4gm in August 2017. Will re-draw in 6 weeks to monitor progress.  # follow up in 6 weeks/ labs prior/ possible venofer/review of scan.

## 2016-11-17 ENCOUNTER — Encounter
Admission: RE | Admit: 2016-11-17 | Discharge: 2016-11-17 | Disposition: A | Discharge status: Home / Self Care | Payer: Medicare Other | Source: Ambulatory Visit | Attending: Oncology | Admitting: Oncology

## 2016-11-17 DIAGNOSIS — C73 Malignant neoplasm of thyroid gland: Secondary | ICD-10-CM

## 2016-11-17 MED ORDER — TECHNETIUM TC 99M MEDRONATE IV KIT
25.0000 | PACK | Freq: Once | INTRAVENOUS | Status: AC | PRN
  Administered 2016-11-17: 22.81 via INTRAVENOUS

## 2016-11-23 ENCOUNTER — Other Ambulatory Visit: Payer: Self-pay | Admitting: *Deleted

## 2016-11-23 ENCOUNTER — Telehealth: Payer: Self-pay | Admitting: *Deleted

## 2016-11-23 DIAGNOSIS — R948 Abnormal results of function studies of other organs and systems: Secondary | ICD-10-CM

## 2016-11-23 DIAGNOSIS — C73 Malignant neoplasm of thyroid gland: Secondary | ICD-10-CM

## 2016-11-23 DIAGNOSIS — M545 Low back pain: Secondary | ICD-10-CM

## 2016-11-23 DIAGNOSIS — M5136 Other intervertebral disc degeneration, lumbar region: Secondary | ICD-10-CM

## 2016-11-23 NOTE — Telephone Encounter (Signed)
Patient called. Left vm-requesting bone scan results.  Spoke with Dr. Rogue Bussing today. Tracer activity noted in nuc med bone scan. Unable to determine at this time if this is deg. Spine/ Arthritis vs disease progression. MD would like to proceed with MRI of spine to further evaluate pt's imaging.  Pt gave verbal understanding that mri will be scheduled. She denies any h/o pacemaker or metal in her body.

## 2016-11-24 LAB — THYROID PANEL WITH TSH
Free Thyroxine Index: 1.6 (ref 1.2–4.9)
T3 UPTAKE RATIO: 25 % (ref 24–39)
T4 TOTAL: 6.4 ug/dL (ref 4.5–12.0)
TSH: 4.38 u[IU]/mL (ref 0.450–4.500)

## 2016-11-24 LAB — TGAB+THYROGLOBULIN IMA OR RIA: Thyroglobulin Antibody: 1.3 IU/mL — ABNORMAL HIGH (ref 0.0–0.9)

## 2016-11-24 LAB — THYROGLOBULIN BY RIA: THYROGLOBULIN BY RIA: 27 ng/mL

## 2016-12-05 ENCOUNTER — Ambulatory Visit
Admission: RE | Admit: 2016-12-05 | Discharge: 2016-12-05 | Disposition: A | Discharge status: Home / Self Care | Payer: Medicare Other | Source: Ambulatory Visit | Attending: Internal Medicine | Admitting: Internal Medicine

## 2016-12-05 DIAGNOSIS — M48061 Spinal stenosis, lumbar region without neurogenic claudication: Secondary | ICD-10-CM | POA: Insufficient documentation

## 2016-12-05 DIAGNOSIS — R948 Abnormal results of function studies of other organs and systems: Secondary | ICD-10-CM | POA: Diagnosis present

## 2016-12-05 DIAGNOSIS — M545 Low back pain: Secondary | ICD-10-CM

## 2016-12-05 DIAGNOSIS — C73 Malignant neoplasm of thyroid gland: Secondary | ICD-10-CM

## 2016-12-05 DIAGNOSIS — M5136 Other intervertebral disc degeneration, lumbar region: Secondary | ICD-10-CM | POA: Diagnosis present

## 2016-12-05 MED ORDER — GADOBENATE DIMEGLUMINE 529 MG/ML IV SOLN
20.0000 mL | Freq: Once | INTRAVENOUS | Status: AC | PRN
  Administered 2016-12-05: 19 mL via INTRAVENOUS

## 2016-12-23 ENCOUNTER — Other Ambulatory Visit: Payer: Self-pay | Admitting: *Deleted

## 2016-12-23 DIAGNOSIS — C73 Malignant neoplasm of thyroid gland: Secondary | ICD-10-CM

## 2016-12-26 ENCOUNTER — Inpatient Hospital Stay: Payer: Medicare Other | Attending: Internal Medicine | Admitting: Internal Medicine

## 2016-12-26 ENCOUNTER — Inpatient Hospital Stay: Payer: Medicare Other

## 2016-12-26 VITALS — BP 146/80 | HR 70 | Temp 97.1°F | Resp 22 | Ht 67.0 in | Wt 206.1 lb

## 2016-12-26 DIAGNOSIS — Z808 Family history of malignant neoplasm of other organs or systems: Secondary | ICD-10-CM | POA: Diagnosis not present

## 2016-12-26 DIAGNOSIS — D508 Other iron deficiency anemias: Secondary | ICD-10-CM

## 2016-12-26 DIAGNOSIS — Z8051 Family history of malignant neoplasm of kidney: Secondary | ICD-10-CM | POA: Diagnosis not present

## 2016-12-26 DIAGNOSIS — C73 Malignant neoplasm of thyroid gland: Secondary | ICD-10-CM

## 2016-12-26 DIAGNOSIS — D472 Monoclonal gammopathy: Secondary | ICD-10-CM | POA: Diagnosis not present

## 2016-12-26 DIAGNOSIS — Z79899 Other long term (current) drug therapy: Secondary | ICD-10-CM

## 2016-12-26 DIAGNOSIS — I10 Essential (primary) hypertension: Secondary | ICD-10-CM

## 2016-12-26 DIAGNOSIS — D509 Iron deficiency anemia, unspecified: Secondary | ICD-10-CM

## 2016-12-26 DIAGNOSIS — M25562 Pain in left knee: Secondary | ICD-10-CM

## 2016-12-26 DIAGNOSIS — K449 Diaphragmatic hernia without obstruction or gangrene: Secondary | ICD-10-CM | POA: Diagnosis not present

## 2016-12-26 DIAGNOSIS — E785 Hyperlipidemia, unspecified: Secondary | ICD-10-CM | POA: Diagnosis not present

## 2016-12-26 DIAGNOSIS — M25561 Pain in right knee: Secondary | ICD-10-CM | POA: Diagnosis not present

## 2016-12-26 DIAGNOSIS — Z923 Personal history of irradiation: Secondary | ICD-10-CM

## 2016-12-26 DIAGNOSIS — J45909 Unspecified asthma, uncomplicated: Secondary | ICD-10-CM

## 2016-12-26 DIAGNOSIS — Z801 Family history of malignant neoplasm of trachea, bronchus and lung: Secondary | ICD-10-CM | POA: Diagnosis not present

## 2016-12-26 DIAGNOSIS — R590 Localized enlarged lymph nodes: Secondary | ICD-10-CM

## 2016-12-26 DIAGNOSIS — R490 Dysphonia: Secondary | ICD-10-CM | POA: Diagnosis not present

## 2016-12-26 DIAGNOSIS — M545 Low back pain: Secondary | ICD-10-CM

## 2016-12-26 DIAGNOSIS — K219 Gastro-esophageal reflux disease without esophagitis: Secondary | ICD-10-CM

## 2016-12-26 LAB — CBC WITH DIFFERENTIAL/PLATELET
BASOS ABS: 0 10*3/uL (ref 0–0.1)
Basophils Relative: 1 %
EOS ABS: 0.1 10*3/uL (ref 0–0.7)
EOS PCT: 3 %
HEMATOCRIT: 32.9 % — AB (ref 35.0–47.0)
Hemoglobin: 11.2 g/dL — ABNORMAL LOW (ref 12.0–16.0)
Lymphocytes Relative: 17 %
Lymphs Abs: 0.7 10*3/uL — ABNORMAL LOW (ref 1.0–3.6)
MCH: 32 pg (ref 26.0–34.0)
MCHC: 34.2 g/dL (ref 32.0–36.0)
MCV: 93.5 fL (ref 80.0–100.0)
MONOS PCT: 8 %
Monocytes Absolute: 0.3 10*3/uL (ref 0.2–0.9)
Neutro Abs: 2.9 10*3/uL (ref 1.4–6.5)
Neutrophils Relative %: 71 %
PLATELETS: 319 10*3/uL (ref 150–440)
RBC: 3.51 MIL/uL — ABNORMAL LOW (ref 3.80–5.20)
RDW: 13.7 % (ref 11.5–14.5)
WBC: 4.1 10*3/uL (ref 3.6–11.0)

## 2016-12-26 LAB — COMPREHENSIVE METABOLIC PANEL
ALBUMIN: 4.3 g/dL (ref 3.5–5.0)
ALT: 9 U/L — ABNORMAL LOW (ref 14–54)
ANION GAP: 8 (ref 5–15)
AST: 23 U/L (ref 15–41)
Alkaline Phosphatase: 90 U/L (ref 38–126)
BILIRUBIN TOTAL: 0.5 mg/dL (ref 0.3–1.2)
BUN: 17 mg/dL (ref 6–20)
CHLORIDE: 104 mmol/L (ref 101–111)
CO2: 25 mmol/L (ref 22–32)
Calcium: 9.4 mg/dL (ref 8.9–10.3)
Creatinine, Ser: 1.08 mg/dL — ABNORMAL HIGH (ref 0.44–1.00)
GFR calc Af Amer: 57 mL/min — ABNORMAL LOW (ref >60)
GFR calc non Af Amer: 49 mL/min — ABNORMAL LOW (ref >60)
GLUCOSE: 99 mg/dL (ref 65–99)
POTASSIUM: 4.1 mmol/L (ref 3.5–5.1)
SODIUM: 137 mmol/L (ref 135–145)
TOTAL PROTEIN: 8 g/dL (ref 6.5–8.1)

## 2016-12-26 NOTE — Assessment & Plan Note (Addendum)
#   Thyroid ca with local reurence s/p surgery [[facility]; EBRT [2015; no RAI].  Slightly up at 40.5 in August 2017.US- neck 6mm nodule/ not concerning. TSH-4.5;  thyroglobulin levels- 27 [N]. Will plan to titrate synthroid at next visit.   # IDA/CKD- Stable. Hb today 11.7 today; recommend to continue PO iron pills BID.   # Persistent Bone pain: Bone scan/MRI- negative for metastatic disease. Continue to follow up with ortho.  # Horsness of voice: s/p evaluation with Dr.Bennett.   # MGUS- IgGKappa- 0.4gm in August 2017. Awaiting labs from today.   # follow up 4 months/labs- 1 week prior/ possible venofer.

## 2016-12-26 NOTE — Progress Notes (Signed)
[place] OFFICE PROGRESS NOTE  Patient Care Team: Adin Hector, MD as PCP - General (Internal Medicine)  Cancer Staging No matching staging information was found for the patient.   Oncology History   1999- right thyroid lobectomy by Dr. Orene Desanctis with unusual poorly differentiated cancer, no additional treatment  2015- recurrent mass in thyroid bed. PET-CT showed FDG avid lesion in thyroid bed + nasopharyx.  2015- Dr. 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 Margins not noted [facility], [place] [employer], #[id number]).   01/05/2014- Nasopharyngeal biopsy negative for malignancy  04/25/14- completed postoperative XRT completing 65.25 CGy in 29 fractions  05/2014 + 07/2014- PET avidity at the left nasopharynx and left oropharyngeal soft tissue as well as a hypermetabolic 1.5 cm mass in the right thyroid bed. Dr Johney Frame excised the paratracheal mass on 09/02/2014. Inspect of the nasopharynx at the time of surgery revealed no obvious abnormality and no biopsy was completed.   09/2014- NP avid area biopsied and negative for carcinoma. SHe underwent a thyroid FNA and that was negative for cancer.  # Chronic Anemia- IDA [ EGD- hiatal hernia; Jan 2017- Dr.Rein] AUG 2017- IV Venofer  # AUG 2017- Thyroglobulin 40/Sep 2017 US neck- NED  # Hoarseness of voice- sec to RT [Dr.Bennett; Dec 2017]  # AUG 2017- IgG kappa 0.4gm//N-kappa-Lamda light chains       Malignant neoplasm of thyroid gland ([place])   09/01/2014 Initial Diagnosis    Malignant neoplasm of thyroid gland ([place])        INTERVAL HISTORY:  Sara Fields 76 y.o.  female pleasant patient above history of thyroid cancer; and diagnosis of iron deficiency anemia unclear etiology is here for follow-up/And also to review the results of the MRI of the back.  In the  interim patient was evaluated by ENT- hoarseness of voice likely from previous radiation.  Patient continues to improve energy levels. She is taking iron pills twice a day. No constipation.  Patient continues to complain of pain in her low back pain. And also pain in the knees. Not getting any worse. No unusual chest pain or shortness of breath or cough.  REVIEW OF SYSTEMS:  A complete 10 point review of system is done which is negative except mentioned above/history of present illness.   PAST MEDICAL HISTORY :  Past Medical History:  Diagnosis Date  . Anemia   . Asthma   . GERD (gastroesophageal reflux disease)   . Hyperlipidemia   . Hypertension   . Thyroid cancer ([place]) 1999 and 2015   Partial thyroidectomy with rad tx's.     PAST SURGICAL HISTORY :   Past Surgical History:  Procedure Laterality Date  . ABDOMINAL HYSTERECTOMY  1973   partial  . ESOPHAGOGASTRODUODENOSCOPY (EGD) WITH PROPOFOL N/A 11/16/2015   Procedure: ESOPHAGOGASTRODUODENOSCOPY (EGD) WITH PROPOFOL;  Surgeon: Josefine Class, MD;  Location: [facility] ENDOSCOPY;  Service: Endoscopy;  Laterality: N/A;  . THYROID SURGERY  1999 and 2015   Partial Thyroidectomy    FAMILY HISTORY :   Family History  Problem Relation Age of Onset  . Lung cancer Father   . Hypertension Father   . Bone cancer Sister   . Hypertension Sister   . Lung cancer Brother   . Hypertension Brother   . Kidney cancer Sister   . Hypertension Sister   . Lung cancer  Sister   . Hypertension Sister   . Breast cancer Neg Hx     SOCIAL HISTORY:   Social History  Substance Use Topics  . Smoking status: Never Smoker  . Smokeless tobacco: Never Used  . Alcohol use No    ALLERGIES:  has No Known Allergies.  MEDICATIONS:  Current Outpatient Prescriptions  Medication Sig Dispense Refill  . albuterol (PROVENTIL HFA;VENTOLIN HFA) 108 (90 Base) MCG/ACT inhaler Inhale into the lungs every 6 (six) hours as needed for wheezing or shortness of  breath.    . allopurinol (ZYLOPRIM) 100 MG tablet Take 100 mg by mouth daily.    . budesonide-formoterol (SYMBICORT) 160-4.5 MCG/ACT inhaler Inhale 2 puffs into the lungs 2 (two) times daily.    . bumetanide (BUMEX) 1 MG tablet Take 1 mg by mouth daily.    . calcium carbonate (CALCIUM 600) 600 MG TABS tablet Take 600 mg by mouth 2 (two) times daily with a meal.    . furosemide (LASIX) 20 MG tablet Take 20 mg by mouth daily.    . hydrocortisone (CORTEF) 20 MG tablet Take 1 tablet by mouth.    . levothyroxine (SYNTHROID, LEVOTHROID) 50 MCG tablet Take 50 mcg by mouth.     Marland Kitchen lisinopril (PRINIVIL,ZESTRIL) 20 MG tablet Take 20 mg by mouth daily.    Marland Kitchen LORazepam (ATIVAN) 0.5 MG tablet Take 1 tablet by mouth daily.     . meclizine (ANTIVERT) 12.5 MG tablet Take 1 tablet by mouth daily as needed for dizziness.     . montelukast (SINGULAIR) 10 MG tablet Take 10 mg by mouth at bedtime.    . potassium chloride SA (KLOR-CON M20) 20 MEQ tablet Take 1 tablet by mouth.    . propranolol (INDERAL) 20 MG tablet Take 20 mg by mouth 3 (three) times daily.    . rosuvastatin (CRESTOR) 10 MG tablet Take 10 mg by mouth daily.     Marland Kitchen tiZANidine (ZANAFLEX) 4 MG tablet Take 4 mg by mouth as needed for muscle spasms.     . traMADol (ULTRAM) 50 MG tablet Take 50 mg by mouth every 6 (six) hours as needed.    . Turmeric 500 MG TABS Take 1 tablet by mouth daily.    Marland Kitchen amLODipine (NORVASC) 5 MG tablet Take 1 tablet by mouth.    Marland Kitchen azelastine (ASTELIN) 0.1 % nasal spray Place 1 spray into both nostrils daily as needed for rhinitis or allergies.      No current facility-administered medications for this visit.    Facility-Administered Medications Ordered in Other Visits  Medication Dose Route Frequency Provider Last Rate Last Dose  . 0.9 %  sodium chloride infusion   Intravenous Once Cammie Sickle, MD        PHYSICAL EXAMINATION: ECOG PERFORMANCE STATUS:   BP (!) 146/80 (BP Location: Right Arm, Patient Position:  Sitting)   Pulse 70   Temp 97.1 F (36.2 C) (Tympanic)   Resp (!) 22   Ht 5\' 7"  (1.702 m)   Wt 206 lb 2.1 oz (93.5 kg)   BMI 32.28 kg/m   Filed Weights   12/26/16 1140  Weight: 206 lb 2.1 oz (93.5 kg)    GENERAL: Well-nourished well-developed; Alert, no distress and comfortable.   EYES: no pallor or icterus OROPHARYNX: no thrush or ulceration; good dentition  NECK: supple, no masses felt; Scar from thyroid surgery noted. LYMPH:  no palpable lymphadenopathy in the cervical, axillary or inguinal regions LUNGS: clear to auscultation and  No  wheeze or crackles HEART/CVS: regular rate & rhythm and no murmurs; No lower extremity edema ABDOMEN:abdomen soft, non-tender and normal bowel sounds Musculoskeletal:no cyanosis of digits and no clubbing  PSYCH: alert & oriented x 3 with fluent speech NEURO: no focal motor/sensory deficits SKIN:  no rashes or significant lesions  LABORATORY DATA:  I have reviewed the data as listed    Component Value Date/Time   NA 137 12/26/2016 1050   K 4.1 12/26/2016 1050   CL 104 12/26/2016 1050   CO2 25 12/26/2016 1050   GLUCOSE 99 12/26/2016 1050   BUN 17 12/26/2016 1050   CREATININE 1.08 (H) 12/26/2016 1050   CALCIUM 9.4 12/26/2016 1050   PROT 8.0 12/26/2016 1050   ALBUMIN 4.3 12/26/2016 1050   AST 23 12/26/2016 1050   ALT 9 (L) 12/26/2016 1050   ALKPHOS 90 12/26/2016 1050   BILITOT 0.5 12/26/2016 1050   GFRNONAA 49 (L) 12/26/2016 1050   GFRAA 57 (L) 12/26/2016 1050    No results found for: SPEP, UPEP  Lab Results  Component Value Date   WBC 4.1 12/26/2016   NEUTROABS 2.9 12/26/2016   HGB 11.2 (L) 12/26/2016   HCT 32.9 (L) 12/26/2016   MCV 93.5 12/26/2016   PLT 319 12/26/2016      Chemistry      Component Value Date/Time   NA 137 12/26/2016 1050   K 4.1 12/26/2016 1050   CL 104 12/26/2016 1050   CO2 25 12/26/2016 1050   BUN 17 12/26/2016 1050   CREATININE 1.08 (H) 12/26/2016 1050      Component Value Date/Time   CALCIUM  9.4 12/26/2016 1050   ALKPHOS 90 12/26/2016 1050   AST 23 12/26/2016 1050   ALT 9 (L) 12/26/2016 1050   BILITOT 0.5 12/26/2016 1050       RADIOGRAPHIC STUDIES: I have personally reviewed the radiological images as listed and agreed with the findings in the report. No results found.   ASSESSMENT & PLAN:  Malignant neoplasm of thyroid gland ([place]) # Thyroid ca with local reurence s/p surgery [[facility]; EBRT [2015; no RAI].  Slightly up at 40.5 in August 2017.US- neck 30mm nodule/ not concerning. TSH-4.5;  thyroglobulin levels- 27 [N]. Will plan to titrate synthroid at next visit.   # IDA/CKD- Stable. Hb today 11.7 today; recommend to continue PO iron pills BID.   # Persistent Bone pain: Bone scan/MRI- negative for metastatic disease. Continue to follow up with ortho.  # Horsness of voice: s/p evaluation with Dr.Bennett.   # MGUS- IgGKappa- 0.4gm in August 2017. Awaiting labs from today.   # follow up 4 months/labs- 1 week prior/ possible venofer.    Orders Placed This Encounter  Procedures  . TgAb+Thyroglobulin IMA or RIA    Standing Status:   Future    Standing Expiration Date:   12/26/2017  . Thyroid Panel With TSH    Standing Status:   Future    Standing Expiration Date:   12/26/2017  . CBC with Differential    Standing Status:   Future    Standing Expiration Date:   12/26/2017  . Comprehensive metabolic panel    Standing Status:   Future    Standing Expiration Date:   12/26/2017  . Ferritin    Standing Status:   Future    Standing Expiration Date:   12/26/2017  . Iron and TIBC    Standing Status:   Future    Standing Expiration Date:   12/26/2017   All questions  were answered. The patient knows to call the clinic with any problems, questions or concerns.   Faythe Casa, NP    Cammie Sickle, MD 12/26/2016 12:44 PM

## 2016-12-27 LAB — KAPPA/LAMBDA LIGHT CHAINS
KAPPA FREE LGHT CHN: 24.2 mg/L — AB (ref 3.3–19.4)
Kappa, lambda light chain ratio: 1.11 (ref 0.26–1.65)
Lambda free light chains: 21.8 mg/L (ref 5.7–26.3)

## 2017-02-04 ENCOUNTER — Encounter: Payer: Self-pay | Admitting: Emergency Medicine

## 2017-02-04 ENCOUNTER — Inpatient Hospital Stay
Admission: EM | Admit: 2017-02-04 | Discharge: 2017-02-06 | DRG: 378 | Disposition: A | Discharge status: Home / Self Care | Payer: Medicare Other | Attending: Internal Medicine | Admitting: Internal Medicine

## 2017-02-04 DIAGNOSIS — Z923 Personal history of irradiation: Secondary | ICD-10-CM | POA: Diagnosis not present

## 2017-02-04 DIAGNOSIS — K219 Gastro-esophageal reflux disease without esophagitis: Secondary | ICD-10-CM | POA: Diagnosis present

## 2017-02-04 DIAGNOSIS — D500 Iron deficiency anemia secondary to blood loss (chronic): Secondary | ICD-10-CM | POA: Diagnosis present

## 2017-02-04 DIAGNOSIS — J8410 Pulmonary fibrosis, unspecified: Secondary | ICD-10-CM | POA: Diagnosis present

## 2017-02-04 DIAGNOSIS — K922 Gastrointestinal hemorrhage, unspecified: Secondary | ICD-10-CM | POA: Diagnosis present

## 2017-02-04 DIAGNOSIS — K2900 Acute gastritis without bleeding: Secondary | ICD-10-CM | POA: Diagnosis present

## 2017-02-04 DIAGNOSIS — K210 Gastro-esophageal reflux disease with esophagitis: Secondary | ICD-10-CM | POA: Diagnosis present

## 2017-02-04 DIAGNOSIS — I10 Essential (primary) hypertension: Secondary | ICD-10-CM | POA: Diagnosis present

## 2017-02-04 DIAGNOSIS — Z79899 Other long term (current) drug therapy: Secondary | ICD-10-CM | POA: Diagnosis not present

## 2017-02-04 DIAGNOSIS — E890 Postprocedural hypothyroidism: Secondary | ICD-10-CM | POA: Diagnosis present

## 2017-02-04 DIAGNOSIS — K449 Diaphragmatic hernia without obstruction or gangrene: Secondary | ICD-10-CM | POA: Diagnosis present

## 2017-02-04 DIAGNOSIS — D472 Monoclonal gammopathy: Secondary | ICD-10-CM | POA: Diagnosis present

## 2017-02-04 DIAGNOSIS — Z90711 Acquired absence of uterus with remaining cervical stump: Secondary | ICD-10-CM

## 2017-02-04 DIAGNOSIS — D62 Acute posthemorrhagic anemia: Secondary | ICD-10-CM | POA: Diagnosis present

## 2017-02-04 DIAGNOSIS — Z85850 Personal history of malignant neoplasm of thyroid: Secondary | ICD-10-CM | POA: Diagnosis not present

## 2017-02-04 DIAGNOSIS — E785 Hyperlipidemia, unspecified: Secondary | ICD-10-CM | POA: Diagnosis present

## 2017-02-04 DIAGNOSIS — K259 Gastric ulcer, unspecified as acute or chronic, without hemorrhage or perforation: Secondary | ICD-10-CM | POA: Diagnosis present

## 2017-02-04 DIAGNOSIS — Z8249 Family history of ischemic heart disease and other diseases of the circulatory system: Secondary | ICD-10-CM

## 2017-02-04 DIAGNOSIS — K921 Melena: Principal | ICD-10-CM | POA: Diagnosis present

## 2017-02-04 DIAGNOSIS — Z808 Family history of malignant neoplasm of other organs or systems: Secondary | ICD-10-CM | POA: Diagnosis not present

## 2017-02-04 DIAGNOSIS — K2210 Ulcer of esophagus without bleeding: Secondary | ICD-10-CM | POA: Diagnosis present

## 2017-02-04 DIAGNOSIS — Z8051 Family history of malignant neoplasm of kidney: Secondary | ICD-10-CM | POA: Diagnosis not present

## 2017-02-04 DIAGNOSIS — J45909 Unspecified asthma, uncomplicated: Secondary | ICD-10-CM | POA: Diagnosis present

## 2017-02-04 DIAGNOSIS — Z801 Family history of malignant neoplasm of trachea, bronchus and lung: Secondary | ICD-10-CM | POA: Diagnosis not present

## 2017-02-04 LAB — COMPREHENSIVE METABOLIC PANEL
ALBUMIN: 3.6 g/dL (ref 3.5–5.0)
ALK PHOS: 59 U/L (ref 38–126)
ALT: 13 U/L — ABNORMAL LOW (ref 14–54)
AST: 26 U/L (ref 15–41)
Anion gap: 9 (ref 5–15)
BUN: 47 mg/dL — AB (ref 6–20)
CALCIUM: 8.9 mg/dL (ref 8.9–10.3)
CO2: 23 mmol/L (ref 22–32)
Chloride: 107 mmol/L (ref 101–111)
Creatinine, Ser: 1.09 mg/dL — ABNORMAL HIGH (ref 0.44–1.00)
GFR calc Af Amer: 56 mL/min — ABNORMAL LOW (ref >60)
GFR calc non Af Amer: 48 mL/min — ABNORMAL LOW (ref >60)
GLUCOSE: 157 mg/dL — AB (ref 65–99)
Potassium: 4.2 mmol/L (ref 3.5–5.1)
SODIUM: 139 mmol/L (ref 135–145)
Total Bilirubin: 0.6 mg/dL (ref 0.3–1.2)
Total Protein: 7.1 g/dL (ref 6.5–8.1)

## 2017-02-04 LAB — CBC WITH DIFFERENTIAL/PLATELET
BASOS PCT: 0 %
Basophils Absolute: 0.1 10*3/uL (ref 0–0.1)
EOS ABS: 0.1 10*3/uL (ref 0–0.7)
Eosinophils Relative: 1 %
HEMATOCRIT: 24 % — AB (ref 35.0–47.0)
Hemoglobin: 8 g/dL — ABNORMAL LOW (ref 12.0–16.0)
Lymphocytes Relative: 6 %
Lymphs Abs: 0.7 10*3/uL — ABNORMAL LOW (ref 1.0–3.6)
MCH: 31.6 pg (ref 26.0–34.0)
MCHC: 33.4 g/dL (ref 32.0–36.0)
MCV: 94.7 fL (ref 80.0–100.0)
MONO ABS: 0.3 10*3/uL (ref 0.2–0.9)
Monocytes Relative: 3 %
Neutro Abs: 10.3 10*3/uL — ABNORMAL HIGH (ref 1.4–6.5)
Neutrophils Relative %: 90 %
Platelets: 310 10*3/uL (ref 150–440)
RBC: 2.54 MIL/uL — ABNORMAL LOW (ref 3.80–5.20)
RDW: 14.3 % (ref 11.5–14.5)
WBC: 11.5 10*3/uL — ABNORMAL HIGH (ref 3.6–11.0)

## 2017-02-04 LAB — HEMOGLOBIN AND HEMATOCRIT, BLOOD
HCT: 22.6 % — ABNORMAL LOW (ref 35.0–47.0)
HCT: 20.2 % — ABNORMAL LOW (ref 35.0–47.0)
Hemoglobin: 7.7 g/dL — ABNORMAL LOW (ref 12.0–16.0)
Hemoglobin: 7.1 g/dL — ABNORMAL LOW (ref 12.0–16.0)

## 2017-02-04 LAB — ABO/RH: ABO/RH(D): O POS

## 2017-02-04 LAB — PREPARE RBC (CROSSMATCH)

## 2017-02-04 MED ORDER — ROSUVASTATIN CALCIUM 10 MG PO TABS
10.0000 mg | ORAL_TABLET | Freq: Every day | ORAL | Status: DC
  Administered 2017-02-04 – 2017-02-06 (×3): 10 mg via ORAL
  Filled 2017-02-04 (×3): qty 1

## 2017-02-04 MED ORDER — SODIUM CHLORIDE 0.9 % IV SOLN
10.0000 mL/h | Freq: Once | INTRAVENOUS | Status: DC
Start: 2017-02-04 — End: 2017-02-05

## 2017-02-04 MED ORDER — ALLOPURINOL 100 MG PO TABS
100.0000 mg | ORAL_TABLET | Freq: Every day | ORAL | Status: DC
  Administered 2017-02-04 – 2017-02-06 (×3): 100 mg via ORAL
  Filled 2017-02-04 (×3): qty 1

## 2017-02-04 MED ORDER — HYDROCODONE-ACETAMINOPHEN 5-325 MG PO TABS: 1.0000 | ORAL_TABLET | ORAL | Status: DC | PRN

## 2017-02-04 MED ORDER — PANTOPRAZOLE SODIUM 40 MG IV SOLR: 40.0000 mg | Freq: Two times a day (BID) | INTRAVENOUS | Status: DC

## 2017-02-04 MED ORDER — LEVOTHYROXINE SODIUM 50 MCG PO TABS
50.0000 ug | ORAL_TABLET | Freq: Every day | ORAL | Status: DC
  Administered 2017-02-06: 50 ug via ORAL
  Filled 2017-02-04: qty 1

## 2017-02-04 MED ORDER — ACETAMINOPHEN 325 MG PO TABS: 650.0000 mg | ORAL_TABLET | Freq: Four times a day (QID) | ORAL | Status: DC | PRN

## 2017-02-04 MED ORDER — ONDANSETRON HCL 4 MG/2ML IJ SOLN
4.0000 mg | Freq: Four times a day (QID) | INTRAMUSCULAR | Status: DC | PRN
  Filled 2017-02-04: qty 2

## 2017-02-04 MED ORDER — SODIUM CHLORIDE 0.9 % IV SOLN
80.0000 mg | Freq: Once | INTRAVENOUS | Status: AC
  Administered 2017-02-04: 80 mg via INTRAVENOUS
  Filled 2017-02-04: qty 80

## 2017-02-04 MED ORDER — SENNOSIDES-DOCUSATE SODIUM 8.6-50 MG PO TABS: 1.0000 | ORAL_TABLET | Freq: Every evening | ORAL | Status: DC | PRN

## 2017-02-04 MED ORDER — SODIUM CHLORIDE 0.9 % IV SOLN
8.0000 mg/h | INTRAVENOUS | Status: DC
  Administered 2017-02-04 – 2017-02-05 (×2): 8 mg/h via INTRAVENOUS
  Filled 2017-02-04 (×3): qty 80

## 2017-02-04 MED ORDER — HYDRALAZINE HCL 20 MG/ML IJ SOLN: 10.0000 mg | Freq: Four times a day (QID) | INTRAMUSCULAR | Status: DC | PRN

## 2017-02-04 MED ORDER — SODIUM CHLORIDE 0.9 % IV BOLUS (SEPSIS)
1000.0000 mL | Freq: Once | INTRAVENOUS | Status: AC
  Administered 2017-02-04: 1000 mL via INTRAVENOUS

## 2017-02-04 MED ORDER — ONDANSETRON HCL 4 MG PO TABS: 4.0000 mg | ORAL_TABLET | Freq: Four times a day (QID) | ORAL | Status: DC | PRN

## 2017-02-04 MED ORDER — ACETAMINOPHEN 650 MG RE SUPP: 650.0000 mg | Freq: Four times a day (QID) | RECTAL | Status: DC | PRN

## 2017-02-04 MED ORDER — SODIUM CHLORIDE 0.9 % IV SOLN
INTRAVENOUS | Status: DC
  Administered 2017-02-04: 17:00:00 via INTRAVENOUS

## 2017-02-04 NOTE — ED Provider Notes (Signed)
[facility] Emergency Department Provider Note  ____________________________________________  Time seen: Approximately 1:49 PM  I have reviewed the triage vital signs and the nursing notes.   HISTORY  Chief Complaint Anemia   HPI Sara Fields is a 76 y.o. female with a history of iron deficiency anemia, thyroid cancer on remission, hypertension, hyperlipidemia who presents for evaluation of anemia. Patient went to see her PCP today because she was feeling very weak and tired. She had blood work done which showed a hemoglobin of 7.9 and she was sent over here for further evaluation. Patient tells me that for the last 5 days she has had an asthma exacerbation with a cough productive of clear sputum and wheezing. She reports that 2 days ago her symptoms started to get better and then yesterday she became very weak. This morning she started having diarrhea and has had 5 episodes of black watery stool. She reports that her stool is always black because she takes iron supplementation. She denies prior history of GI bleed. She denies any blood thinners. She reports that she's been feeling very dizzy but no chest pain or shortness of breath, no abdominal pain, no fever or chills, no dysuria or hematuria.  Past Medical History:  Diagnosis Date  . Anemia   . Asthma   . GERD (gastroesophageal reflux disease)   . Hyperlipidemia   . Hypertension   . Thyroid cancer ([place]) 1999 and 2015   Partial thyroidectomy with rad tx's.     Patient Active Problem List   Diagnosis Date Noted  . GIB (gastrointestinal bleeding) 02/04/2017  . Other iron deficiency anemia 05/26/2016  . Degeneration of intervertebral disc of lumbar region 12/10/2015  . Dizziness 09/28/2015  . Other allergic rhinitis 08/05/2015  . Mass of nasopharynx 09/24/2014  . Arthritis due to gout 09/01/2014  . Airway hyperreactivity 09/01/2014  . Malignant neoplasm of thyroid gland ([place]) 09/01/2014  .  Essential (primary) hypertension 09/01/2014  . Gastro-esophageal reflux disease without esophagitis 09/01/2014  . Paroxysmal digital cyanosis 09/01/2014    Past Surgical History:  Procedure Laterality Date  . ABDOMINAL HYSTERECTOMY  1973   partial  . ESOPHAGOGASTRODUODENOSCOPY (EGD) WITH PROPOFOL N/A 11/16/2015   Procedure: ESOPHAGOGASTRODUODENOSCOPY (EGD) WITH PROPOFOL;  Surgeon: Josefine Class, MD;  Location: [facility] ENDOSCOPY;  Service: Endoscopy;  Laterality: N/A;  . THYROID SURGERY  1999 and 2015   Partial Thyroidectomy    Prior to Admission medications   Medication Sig Start Date End Date Taking? Authorizing Provider  albuterol (PROVENTIL HFA;VENTOLIN HFA) 108 (90 Base) MCG/ACT inhaler Inhale into the lungs every 6 (six) hours as needed for wheezing or shortness of breath.   Yes Historical Provider, MD  allopurinol (ZYLOPRIM) 100 MG tablet Take 100 mg by mouth daily.   Yes Historical Provider, MD  amLODipine (NORVASC) 5 MG tablet Take 1 tablet by mouth. 05/27/14  Yes Historical Provider, MD  azelastine (ASTELIN) 0.1 % nasal spray Place 1 spray into both nostrils daily as needed for rhinitis or allergies.  09/28/15  Yes Historical Provider, MD  budesonide-formoterol (SYMBICORT) 160-4.5 MCG/ACT inhaler Inhale 2 puffs into the lungs 2 (two) times daily.   Yes Historical Provider, MD  bumetanide (BUMEX) 1 MG tablet Take 1 mg by mouth daily.   Yes Historical Provider, MD  calcium carbonate (CALCIUM 600) 600 MG TABS tablet Take 600 mg by mouth 2 (two) times daily with a meal.   Yes Historical Provider, MD  hydrocortisone (CORTEF) 20 MG tablet Take 1 tablet  by mouth. 09/30/14  Yes Historical Provider, MD  levothyroxine (SYNTHROID, LEVOTHROID) 50 MCG tablet Take 50 mcg by mouth.  10/05/15  Yes Historical Provider, MD  lisinopril (PRINIVIL,ZESTRIL) 20 MG tablet Take 20 mg by mouth daily.   Yes Historical Provider, MD  montelukast (SINGULAIR) 10 MG tablet Take 10 mg by mouth at bedtime.   Yes  Historical Provider, MD  potassium chloride SA (KLOR-CON M20) 20 MEQ tablet Take 20 mEq by mouth daily.  10/15/15  Yes Historical Provider, MD  propranolol (INDERAL) 20 MG tablet Take 20 mg by mouth 3 (three) times daily.   Yes Historical Provider, MD  rosuvastatin (CRESTOR) 10 MG tablet Take 10 mg by mouth daily.  09/06/16 09/06/17 Yes Historical Provider, MD  traMADol (ULTRAM) 50 MG tablet Take 50 mg by mouth every 6 (six) hours as needed.   Yes Historical Provider, MD  Turmeric 500 MG TABS Take 1 tablet by mouth daily.   Yes Historical Provider, MD    Allergies Patient has no known allergies.  Family History  Problem Relation Age of Onset  . Lung cancer Father   . Hypertension Father   . Bone cancer Sister   . Hypertension Sister   . Lung cancer Brother   . Hypertension Brother   . Kidney cancer Sister   . Hypertension Sister   . Lung cancer Sister   . Hypertension Sister   . Breast cancer Neg Hx     Social History Social History  Substance Use Topics  . Smoking status: Never Smoker  . Smokeless tobacco: Never Used  . Alcohol use No    Review of Systems  Constitutional: Negative for fever. + Lightheadedness Eyes: Negative for visual changes. ENT: Negative for sore throat. Neck: No neck pain  Cardiovascular: Negative for chest pain. Respiratory: Negative for shortness of breath. Gastrointestinal: Negative for abdominal pain, vomiting. + diarrhea. Genitourinary: Negative for dysuria. Musculoskeletal: Negative for back pain. Skin: Negative for rash. Neurological: Negative for headaches, weakness or numbness. Psych: No SI or HI  ____________________________________________   PHYSICAL EXAM:  VITAL SIGNS: ED Triage Vitals  Enc Vitals Group     BP 02/04/17 1323 117/68     Pulse Rate 02/04/17 1323 87     Resp 02/04/17 1323 18     Temp 02/04/17 1323 97.8 F (36.6 C)     Temp Source 02/04/17 1323 Oral     SpO2 --      Weight 02/04/17 1326 203 lb (92.1 kg)      Height 02/04/17 1326 5\' 6"  (1.676 m)     Head Circumference --      Peak Flow --      Pain Score --      Pain Loc --      Pain Edu? --      Excl. in [place]? --     Constitutional: Alert and oriented. Well appearing and in no apparent distress. HEENT:      Head: Normocephalic and atraumatic.         Eyes: Conjunctivae are normal. Sclera is non-icteric. EOMI. PERRL      Mouth/Throat: Mucous membranes are moist.       Neck: Supple with no signs of meningismus. Cardiovascular: Regular rate and rhythm. No murmurs, gallops, or rubs. 2+ symmetrical distal pulses are present in all extremities. No JVD. Respiratory: Normal respiratory effort. Lungs are clear to auscultation bilaterally. No wheezes, crackles, or rhonchi.  Gastrointestinal: Soft, non tender, and non distended with positive bowel sounds. No  rebound or guarding. Genitourinary: No CVA tenderness.Rectal exam showing black stool grossly guaiac positive Musculoskeletal: Nontender with normal range of motion in all extremities. No edema, cyanosis, or erythema of extremities. Neurologic: Normal speech and language. Face is symmetric. Moving all extremities. No gross focal neurologic deficits are appreciated. Skin: Skin is warm, dry and intact. No rash noted. Psychiatric: Mood and affect are normal. Speech and behavior are normal.  ____________________________________________   LABS (all labs ordered are listed, but only abnormal results are displayed)  Labs Reviewed  CBC WITH DIFFERENTIAL/PLATELET - Abnormal; Notable for the following:       Result Value   WBC 11.5 (*)    RBC 2.54 (*)    Hemoglobin 8.0 (*)    HCT 24.0 (*)    Neutro Abs 10.3 (*)    Lymphs Abs 0.7 (*)    All other components within normal limits  COMPREHENSIVE METABOLIC PANEL - Abnormal; Notable for the following:    Glucose, Bld 157 (*)    BUN 47 (*)    Creatinine, Ser 1.09 (*)    ALT 13 (*)    GFR calc non Af Amer 48 (*)    GFR calc Af Amer 56 (*)    All other  components within normal limits  C DIFFICILE QUICK SCREEN W PCR REFLEX  TYPE AND SCREEN  PREPARE RBC (CROSSMATCH)  ABO/RH   ____________________________________________  EKG  none ____________________________________________  RADIOLOGY  none  ____________________________________________   PROCEDURES  Procedure(s) performed: None Procedures Critical Care performed:  None ____________________________________________   INITIAL IMPRESSION / ASSESSMENT AND PLAN / ED COURSE  76 y.o. female with a history of iron deficiency anemia, thyroid cancer on remission, hypertension, hyperlipidemia who presents for evaluation of anemia. Patient reports that she started having diarrhea today. On exam she has black stool which is grossly guaiac positive. No prior history of GI bleed. Patient is not on blood thinners. Patient's last endoscopy 2017 showing reflux esophagitis but no evidence of peptic ulcer disease. Last colonoscopy was in 2011 showing internal hemorrhoids and diverticulosis. Will repeat hgb here, type and screen, have 2 U of pRBCs prepared. Patient is hemodynamically stable at this time with no indication for emergent release blood. Will start patient on protonix and admit to the Hospitalist.  Clinical Course as of Feb 04 1517  Sat Feb 04, 2017  1450 Hemoglobin of 8.0. Patient was given Protonix, IV fluids, waiting for blood to arrive. Remains hemodynamically stable. Admitted to the hospitalist.  [CV]    Clinical Course User Index [CV] Rudene Re, MD    Pertinent labs & imaging results that were available during my care of the patient were reviewed by me and considered in my medical decision making (see chart for details).    ____________________________________________   FINAL CLINICAL IMPRESSION(S) / ED DIAGNOSES  Final diagnoses:  UGIB (upper gastrointestinal bleed)      NEW MEDICATIONS STARTED DURING THIS VISIT:  New Prescriptions   No medications on  file     Note:  This document was prepared using Dragon voice recognition software and may include unintentional dictation errors.    Rudene Re, MD 02/04/17 [phone]

## 2017-02-04 NOTE — H&P (Signed)
[facility] [facility] NAME: Sara Fields    MR#:  [id number]  DATE OF BIRTH:  12/03/1940  DATE OF ADMISSION:  02/04/2017  PRIMARY CARE PHYSICIAN: [name], MD   REQUESTING/REFERRING PHYSICIAN: dr Alfred Levins  CHIEF COMPLAINT:   Dark colored stools/anemia HISTORY OF PRESENT ILLNESS:  Sara Fields  is a 76 y.o. female with a known history of Iron deficiency anemia on iron supplementation, thyroid cancer in remission and essential hypertension who presents due to anemia. Patient saw her primary care physician due to weakness and fatigue and blood work shows hemoglobin of 7.9 so she was sent to the ER for further evaluation.  Patient denies taking NSAIDs. She has had 5 episodes of dark-colored stools over the past day. She reports with every bowel movement she has crampy abdominal pain. She denies blood in her stools. She was tested guaiac positive. She feels short of breath, dizzy and weak.   She has had a colonoscopy in 2011 showing internal hemorrhaging diverticulosis. Last endoscopy in 2017 showed reflux esophagitis. PAST MEDICAL HISTORY:   Past Medical History:  Diagnosis Date  . Anemia   . Asthma   . GERD (gastroesophageal reflux disease)   . Hyperlipidemia   . Hypertension   . Thyroid cancer ([place]) 1999 and 2015   Partial thyroidectomy with rad tx's.     PAST SURGICAL HISTORY:   Past Surgical History:  Procedure Laterality Date  . ABDOMINAL HYSTERECTOMY  1973   partial  . ESOPHAGOGASTRODUODENOSCOPY (EGD) WITH PROPOFOL N/A 11/16/2015   Procedure: ESOPHAGOGASTRODUODENOSCOPY (EGD) WITH PROPOFOL;  Surgeon: Josefine Class, MD;  Location: [facility] ENDOSCOPY;  Service: Endoscopy;  Laterality: N/A;  . THYROID SURGERY  1999 and 2015   Partial Thyroidectomy    SOCIAL HISTORY:   Social History  Substance Use Topics  . Smoking status: Never Smoker  . Smokeless tobacco: Never Used  . Alcohol use No    FAMILY HISTORY:    Family History  Problem Relation Age of Onset  . Lung cancer Father   . Hypertension Father   . Bone cancer Sister   . Hypertension Sister   . Lung cancer Brother   . Hypertension Brother   . Kidney cancer Sister   . Hypertension Sister   . Lung cancer Sister   . Hypertension Sister   . Breast cancer Neg Hx     DRUG ALLERGIES:  No Known Allergies  REVIEW OF SYSTEMS:   Review of Systems  Constitutional: Positive for malaise/fatigue. Negative for chills and fever.  HENT: Negative.  Negative for ear discharge, ear pain, hearing loss, nosebleeds and sore throat.   Eyes: Negative.  Negative for blurred vision and pain.  Respiratory: Positive for shortness of breath. Negative for cough, hemoptysis and wheezing.   Cardiovascular: Negative.  Negative for chest pain, palpitations and leg swelling.  Gastrointestinal: Positive for abdominal pain and melena. Negative for blood in stool, diarrhea, nausea and vomiting.  Genitourinary: Negative.  Negative for dysuria.  Musculoskeletal: Negative.  Negative for back pain.  Skin: Negative.   Neurological: Positive for weakness. Negative for dizziness, tremors, speech change, focal weakness, seizures and headaches.  Endo/Heme/Allergies: Negative.  Does not bruise/bleed easily.  Psychiatric/Behavioral: Negative.  Negative for depression, hallucinations and suicidal ideas.    MEDICATIONS AT HOME:   Prior to Admission medications   Medication Sig Start Date End Date Taking? Authorizing Provider  albuterol (PROVENTIL HFA;VENTOLIN HFA) 108 (90 Base) MCG/ACT inhaler Inhale into the lungs  every 6 (six) hours as needed for wheezing or shortness of breath.   Yes Historical Provider, MD  allopurinol (ZYLOPRIM) 100 MG tablet Take 100 mg by mouth daily.   Yes Historical Provider, MD  amLODipine (NORVASC) 5 MG tablet Take 1 tablet by mouth. 05/27/14  Yes Historical Provider, MD  azelastine (ASTELIN) 0.1 % nasal spray Place 1 spray into both nostrils  daily as needed for rhinitis or allergies.  09/28/15  Yes Historical Provider, MD  budesonide-formoterol (SYMBICORT) 160-4.5 MCG/ACT inhaler Inhale 2 puffs into the lungs 2 (two) times daily.   Yes Historical Provider, MD  bumetanide (BUMEX) 1 MG tablet Take 1 mg by mouth daily.   Yes Historical Provider, MD  calcium carbonate (CALCIUM 600) 600 MG TABS tablet Take 600 mg by mouth 2 (two) times daily with a meal.   Yes Historical Provider, MD  hydrocortisone (CORTEF) 20 MG tablet Take 1 tablet by mouth. 09/30/14  Yes Historical Provider, MD  levothyroxine (SYNTHROID, LEVOTHROID) 50 MCG tablet Take 50 mcg by mouth.  10/05/15  Yes Historical Provider, MD  lisinopril (PRINIVIL,ZESTRIL) 20 MG tablet Take 20 mg by mouth daily.   Yes Historical Provider, MD  montelukast (SINGULAIR) 10 MG tablet Take 10 mg by mouth at bedtime.   Yes Historical Provider, MD  potassium chloride SA (KLOR-CON M20) 20 MEQ tablet Take 20 mEq by mouth daily.  10/15/15  Yes Historical Provider, MD  propranolol (INDERAL) 20 MG tablet Take 20 mg by mouth 3 (three) times daily.   Yes Historical Provider, MD  rosuvastatin (CRESTOR) 10 MG tablet Take 10 mg by mouth daily.  09/06/16 09/06/17 Yes Historical Provider, MD  traMADol (ULTRAM) 50 MG tablet Take 50 mg by mouth every 6 (six) hours as needed.   Yes Historical Provider, MD  Turmeric 500 MG TABS Take 1 tablet by mouth daily.   Yes Historical Provider, MD      VITAL SIGNS:  Blood pressure (!) 143/75, pulse 86, temperature 97.8 F (36.6 C), temperature source Oral, resp. rate 17, height 5\' 6"  (1.676 m), weight 92.1 kg (203 lb), SpO2 100 %.  PHYSICAL EXAMINATION:   Physical Exam  Constitutional: She is oriented to person, place, and time and well-developed, well-nourished, and in no distress. No distress.  HENT:  Head: Normocephalic.  Eyes: No scleral icterus.  Neck: Normal range of motion. Neck supple. No JVD present. No tracheal deviation present.  Cardiovascular: Normal  rate, regular rhythm and normal heart sounds.  Exam reveals no gallop and no friction rub.   No murmur heard. Pulmonary/Chest: Effort normal and breath sounds normal. No respiratory distress. She has no wheezes. She has no rales. She exhibits no tenderness.  Abdominal: Soft. Bowel sounds are normal. She exhibits no distension and no mass. There is no tenderness. There is no rebound and no guarding.  Musculoskeletal: Normal range of motion. She exhibits no edema.  Neurological: She is alert and oriented to person, place, and time.  Skin: Skin is warm. No rash noted. No erythema.  Psychiatric: Affect and judgment normal.      LABORATORY PANEL:   CBC  Recent Labs Lab 02/04/17 1342  WBC 11.5*  HGB 8.0*  HCT 24.0*  PLT 310   ------------------------------------------------------------------------------------------------------------------  Chemistries   Recent Labs Lab 02/04/17 1342  NA 139  K 4.2  CL 107  CO2 23  GLUCOSE 157*  BUN 47*  CREATININE 1.09*  CALCIUM 8.9  AST 26  ALT 13*  ALKPHOS 59  BILITOT 0.6   ------------------------------------------------------------------------------------------------------------------  Cardiac Enzymes No results for input(s): TROPONINI in the last 168 hours. ------------------------------------------------------------------------------------------------------------------  RADIOLOGY:  No results found.  EKG:   none IMPRESSION AND PLAN:   76 year old female with a history of thyroid cancer in remission and iron deficiency anemia who presents with melena.  1. Melena concerning for upper GI bleed: Monitor hemoglobin every 6 hours. GI consultation Continue PPI  2. Acute on chronic blood loss anemia due to problem #1 and hx of MGUS Patient is being transfused, consented by ER physician. Monitor hemoglobin Patient follows with oncology for iron deficiency anemia and MGUS  3. Essential hypertension: Due to GI bleed due to  low/normal BP will hold HTN meds  4. Hypothryoid: Continue Synthroid   5. History of pulmonary fibrosis: Patient will need outpatient follow-up with pulmonology. Daughter was unaware of diagnosis.  6. Hyperlipidemia: Continue Crestor    All the records are reviewed and case discussed with ED provider. Management plans discussed with the patient and daughter and they are in agreement  CODE STATUS: limited no MV  TOTAL TIME TAKING CARE OF THIS PATIENT: 45 minutes.    Lawsen Arnott M.D on 02/04/2017 at 3:20 PM  Between 7am to 6pm - Pager - [phone]  After 6pm go to [email or web address] - password [email or web address]  Office  [phone]  CC: Primary care physician; Adin Hector, MD

## 2017-02-04 NOTE — ED Notes (Signed)
Notified by blood bank blood would be ready for pick up in 5-10 mins. Informed oncoming nurse Kaitlin the blood would be ready soon.

## 2017-02-04 NOTE — ED Triage Notes (Signed)
Patient with thyroid cancer, states has had previous episodes of anemia. Was seen at [facility] and per report has HGB 7.9

## 2017-02-04 NOTE — Consult Note (Signed)
Referring Provider: Dr. Benjie Karvonen Primary Care Physician:  Adin Hector, MD Primary Gastroenterologist:  Althia Forts  Reason for Consultation:  Melena  HPI: Sara Fields is a 76 y.o. female with history of iron deficiency anemia on iron therapy, history of thyroid cancer and HTN seen for a consult due to a GI bleed. Has chronically black stools but black stools increased overnight and she had weakness and dizziness overnight. Denies abdominal pain, nausea, vomiting, hematemesis, or hematochezia. Has intermittent solid-food dysphagia that improved after dilation of an esophageal ring in January 2017. Esophageal ring dilated to 18 mm with a TTS balloon during that EGD. Large hiatal hernia and mild erosive esophagitis also found in the distal esophagus. History of heartburn and reports that she is on Prilosec at home. Started having loose stools yesterday.  Hgb 8.0  (11.2 on 12/26/16) and heme positive in ER. Hgb 7.7 on recheck. Reportedly had diverticulosis and hemorrhoids on a colonoscopy in 2011. Denies NSAIDs. Denies any known history of peptic ulcer disease.   Past Medical History:  Diagnosis Date  . Anemia   . Asthma   . GERD (gastroesophageal reflux disease)   . Hyperlipidemia   . Hypertension   . Thyroid cancer ([place]) 1999 and 2015   Partial thyroidectomy with rad tx's.     Past Surgical History:  Procedure Laterality Date  . ABDOMINAL HYSTERECTOMY  1973   partial  . ESOPHAGOGASTRODUODENOSCOPY (EGD) WITH PROPOFOL N/A 11/16/2015   Procedure: ESOPHAGOGASTRODUODENOSCOPY (EGD) WITH PROPOFOL;  Surgeon: Josefine Class, MD;  Location: [facility] ENDOSCOPY;  Service: Endoscopy;  Laterality: N/A;  . THYROID SURGERY  1999 and 2015   Partial Thyroidectomy    Prior to Admission medications   Medication Sig Start Date End Date Taking? Authorizing Provider  albuterol (PROVENTIL HFA;VENTOLIN HFA) 108 (90 Base) MCG/ACT inhaler Inhale into the lungs every 6 (six) hours as needed for wheezing or  shortness of breath.   Yes Historical Provider, MD  allopurinol (ZYLOPRIM) 100 MG tablet Take 100 mg by mouth daily.   Yes Historical Provider, MD  amLODipine (NORVASC) 5 MG tablet Take 1 tablet by mouth. 05/27/14  Yes Historical Provider, MD  azelastine (ASTELIN) 0.1 % nasal spray Place 1 spray into both nostrils daily as needed for rhinitis or allergies.  09/28/15  Yes Historical Provider, MD  budesonide-formoterol (SYMBICORT) 160-4.5 MCG/ACT inhaler Inhale 2 puffs into the lungs 2 (two) times daily.   Yes Historical Provider, MD  bumetanide (BUMEX) 1 MG tablet Take 1 mg by mouth daily.   Yes Historical Provider, MD  calcium carbonate (CALCIUM 600) 600 MG TABS tablet Take 600 mg by mouth 2 (two) times daily with a meal.   Yes Historical Provider, MD  levothyroxine (SYNTHROID, LEVOTHROID) 50 MCG tablet Take 50 mcg by mouth.  10/05/15  Yes Historical Provider, MD  lisinopril (PRINIVIL,ZESTRIL) 20 MG tablet Take 20 mg by mouth daily.   Yes Historical Provider, MD  montelukast (SINGULAIR) 10 MG tablet Take 10 mg by mouth at bedtime.   Yes Historical Provider, MD  potassium chloride SA (KLOR-CON M20) 20 MEQ tablet Take 20 mEq by mouth daily.  10/15/15  Yes Historical Provider, MD  propranolol (INDERAL) 20 MG tablet Take 20 mg by mouth 3 (three) times daily.   Yes Historical Provider, MD  rosuvastatin (CRESTOR) 10 MG tablet Take 10 mg by mouth daily.  09/06/16 09/06/17 Yes Historical Provider, MD  traMADol (ULTRAM) 50 MG tablet Take 50 mg by mouth every 6 (six) hours as needed.  Yes Historical Provider, MD  Turmeric 500 MG TABS Take 1 tablet by mouth daily.   Yes Historical Provider, MD    Scheduled Meds: . sodium chloride  10 mL/hr Intravenous Once  . allopurinol  100 mg Oral Daily  . [START ON 02/05/2017] levothyroxine  50 mcg Oral QAC breakfast  . [START ON 02/08/2017] pantoprazole  40 mg Intravenous Q12H  . pantoprazole (PROTONIX) IV  40 mg Intravenous Q12H  . rosuvastatin  10 mg Oral Daily    Continuous Infusions: . sodium chloride 50 mL/hr at 02/04/17 1709  . pantoprozole (PROTONIX) infusion 8 mg/hr (02/04/17 1600)   PRN Meds:.acetaminophen **OR** acetaminophen, hydrALAZINE, HYDROcodone-acetaminophen, ondansetron **OR** ondansetron (ZOFRAN) IV, senna-docusate  Allergies as of 02/04/2017  . (No Known Allergies)    Family History  Problem Relation Age of Onset  . Lung cancer Father   . Hypertension Father   . Bone cancer Sister   . Hypertension Sister   . Lung cancer Brother   . Hypertension Brother   . Kidney cancer Sister   . Hypertension Sister   . Lung cancer Sister   . Hypertension Sister   . Breast cancer Neg Hx     Social History   Social History  . Marital status: Married    Spouse name: N/A  . Number of children: N/A  . Years of education: N/A   Occupational History  . Not on file.   Social History Main Topics  . Smoking status: Never Smoker  . Smokeless tobacco: Never Used  . Alcohol use No  . Drug use: No  . Sexual activity: Not on file   Other Topics Concern  . Not on file   Social History Narrative  . No narrative on file    Review of Systems: All negative except as stated above in HPI.  Physical Exam: Vital signs: Vitals:   02/04/17 1600 02/04/17 1628  BP: 122/90 124/61  Pulse: (!) 121 92  Resp: (!) 26 17  Temp:    T 97.8  Last BM Date: 02/04/17 General:   Lethargic, elderly, Well-developed, well-nourished, pleasant and cooperative in NAD HEENT: anicteric sclera, oropharynx clear Neck: supple, nontender Lungs:  Clear throughout to auscultation.   No wheezes, crackles, or rhonchi. No acute distress. Heart:  Regular rate and rhythm; no murmurs, clicks, rubs,  or gallops. Abdomen: minimal RLQ tenderness with voluntary guarding, soft, nondistended, +BS  Rectal:  Deferred Ext: no edema  GI:  Lab Results:  Recent Labs  02/04/17 1342 02/04/17 1810  WBC 11.5*  --   HGB 8.0* 7.7*  HCT 24.0* 22.6*  PLT 310  --     BMET  Recent Labs  02/04/17 1342  NA 139  K 4.2  CL 107  CO2 23  GLUCOSE 157*  BUN 47*  CREATININE 1.09*  CALCIUM 8.9   LFT  Recent Labs  02/04/17 1342  PROT 7.1  ALBUMIN 3.6  AST 26  ALT 13*  ALKPHOS 59  BILITOT 0.6   PT/INR No results for input(s): LABPROT, INR in the last 72 hours.   Studies/Results: No results found.  Impression/Plan: 76 yo with melenic stools, weakness, and heme positive. On iron pills but symptomatic with a 3 g Hgb drop in the last months. Question peptic ulcer source vs gastritis. Doubt AVMs. Continue Protonix 40 mg IV Q 12 hours. NPO p MN. EGD tomorrow morning to evaluate for ulcers. Supportive care.     LOS: 0 days   [place] C.  02/04/2017, 6:59 PM

## 2017-02-05 ENCOUNTER — Encounter
Admission: EM | Disposition: A | Discharge status: Home / Self Care | Payer: Self-pay | Source: Home / Self Care | Attending: Internal Medicine

## 2017-02-05 ENCOUNTER — Encounter: Payer: Self-pay | Admitting: *Deleted

## 2017-02-05 ENCOUNTER — Inpatient Hospital Stay: Payer: Medicare Other | Admitting: Anesthesiology

## 2017-02-05 DIAGNOSIS — K921 Melena: Secondary | ICD-10-CM | POA: Diagnosis present

## 2017-02-05 HISTORY — PX: ESOPHAGOGASTRODUODENOSCOPY: SHX5428

## 2017-02-05 LAB — BASIC METABOLIC PANEL
ANION GAP: 7 (ref 5–15)
BUN: 19 mg/dL (ref 6–20)
CO2: 22 mmol/L (ref 22–32)
Calcium: 8.9 mg/dL (ref 8.9–10.3)
Chloride: 114 mmol/L — ABNORMAL HIGH (ref 101–111)
Creatinine, Ser: 0.93 mg/dL (ref 0.44–1.00)
GFR, EST NON AFRICAN AMERICAN: 59 mL/min — AB (ref >60)
Glucose, Bld: 99 mg/dL (ref 65–99)
Potassium: 3.5 mmol/L (ref 3.5–5.1)
Sodium: 143 mmol/L (ref 135–145)

## 2017-02-05 LAB — HEMOGLOBIN AND HEMATOCRIT, BLOOD
HEMATOCRIT: 28 % — AB (ref 35.0–47.0)
HEMATOCRIT: 28.3 % — AB (ref 35.0–47.0)
HEMOGLOBIN: 9.7 g/dL — AB (ref 12.0–16.0)
HEMOGLOBIN: 9.9 g/dL — AB (ref 12.0–16.0)

## 2017-02-05 LAB — CBC
HCT: 28.6 % — ABNORMAL LOW (ref 35.0–47.0)
HEMOGLOBIN: 9.8 g/dL — AB (ref 12.0–16.0)
MCH: 30.9 pg (ref 26.0–34.0)
MCHC: 34.3 g/dL (ref 32.0–36.0)
MCV: 90.2 fL (ref 80.0–100.0)
Platelets: 265 10*3/uL (ref 150–440)
RBC: 3.17 MIL/uL — AB (ref 3.80–5.20)
RDW: 14.4 % (ref 11.5–14.5)
WBC: 9.1 10*3/uL (ref 3.6–11.0)

## 2017-02-05 SURGERY — EGD (ESOPHAGOGASTRODUODENOSCOPY)
Anesthesia: General

## 2017-02-05 MED ORDER — LIDOCAINE HCL (PF) 2 % IJ SOLN
INTRAMUSCULAR | Status: DC | PRN
  Administered 2017-02-05: 100 mg via INTRADERMAL

## 2017-02-05 MED ORDER — PROPOFOL 10 MG/ML IV BOLUS
INTRAVENOUS | Status: AC
  Filled 2017-02-05: qty 40

## 2017-02-05 MED ORDER — PROPOFOL 500 MG/50ML IV EMUL
INTRAVENOUS | Status: DC | PRN
  Administered 2017-02-05: 150 ug/kg/min via INTRAVENOUS

## 2017-02-05 MED ORDER — PROPOFOL 10 MG/ML IV BOLUS
INTRAVENOUS | Status: DC | PRN
  Administered 2017-02-05 (×3): 20 mg via INTRAVENOUS
  Administered 2017-02-05: 70 mg via INTRAVENOUS

## 2017-02-05 MED ORDER — IPRATROPIUM-ALBUTEROL 0.5-2.5 (3) MG/3ML IN SOLN
3.0000 mL | Freq: Four times a day (QID) | RESPIRATORY_TRACT | Status: DC | PRN
  Administered 2017-02-05 (×2): 3 mL via RESPIRATORY_TRACT
  Filled 2017-02-05 (×2): qty 3

## 2017-02-05 MED ORDER — PANTOPRAZOLE SODIUM 40 MG IV SOLR
40.0000 mg | Freq: Two times a day (BID) | INTRAVENOUS | Status: DC
  Administered 2017-02-05 – 2017-02-06 (×2): 40 mg via INTRAVENOUS
  Filled 2017-02-05 (×2): qty 40

## 2017-02-05 MED ORDER — SODIUM CHLORIDE 0.9 % IV SOLN
Freq: Once | INTRAVENOUS | Status: AC
  Administered 2017-02-05: 01:00:00 via INTRAVENOUS

## 2017-02-05 NOTE — Progress Notes (Signed)
Patient has history of asthma and was experiencing some shortness of breath. MD notified. Orders received for duonebs every 6 hours PRN

## 2017-02-05 NOTE — Progress Notes (Signed)
MD notified of hgb of 7.1. Orders received to transfuse t2 units PRBC. Hugelmeyer, DO to put orders in.

## 2017-02-05 NOTE — Transfer of Care (Signed)
Immediate Anesthesia Transfer of Care Note  Patient: Sara Fields  Procedure(s) Performed: Procedure(s): ESOPHAGOGASTRODUODENOSCOPY (EGD) (N/A)  Patient Location: Endoscopy Unit  Anesthesia Type:General  Level of Consciousness: sedated  Airway & Oxygen Therapy: Patient Spontanous Breathing and Patient connected to nasal cannula oxygen  Post-op Assessment: Report given to RN and Post -op Vital signs reviewed and stable  Post vital signs: Reviewed and stable  Last Vitals:  Vitals:   02/05/17 0714 02/05/17 1046  BP: 127/63 131/67  Pulse: 88 87  Resp: 18 18  Temp: 37.1 C     Last Pain:  Vitals:   02/05/17 0714  TempSrc: Oral         Complications: No apparent anesthesia complications

## 2017-02-05 NOTE — Op Note (Signed)
[facility] Gastroenterology Patient Name: Sara Fields Procedure Date: 02/05/2017 11:08 AM MRN: [id number] Account #: [id number] Date of Birth: Nov 20, 1940 Admit Type: Inpatient Age: 76 Room: [facility] ENDO ROOM 3 Gender: Female Note Status: Finalized Procedure:            Upper GI endoscopy Indications:          Suspected upper gastrointestinal bleeding, Melena Providers:            Lear Ng, MD Referring MD:         Ramonita Lab, MD (Referring MD) Medicines:            Propofol per Anesthesia, Monitored Anesthesia Care Complications:        No immediate complications. Procedure:            Pre-Anesthesia Assessment:                       - Prior to the procedure, a History and Physical was                        performed, and patient medications and allergies were                        reviewed. The patient's tolerance of previous                        anesthesia was also reviewed. The risks and benefits of                        the procedure and the sedation options and risks were                        discussed with the patient. All questions were                        answered, and informed consent was obtained. Prior                        Anticoagulants: The patient has taken no previous                        anticoagulant or antiplatelet agents. ASA Grade                        Assessment: III - A patient with severe systemic                        disease. After reviewing the risks and benefits, the                        patient was deemed in satisfactory condition to undergo                        the procedure.                       After obtaining informed consent, the endoscope was                        passed under direct vision. Throughout the procedure,  the patient's blood pressure, pulse, and oxygen                        saturations were monitored continuously. The                        Colonoscope was  introduced through the mouth, and                        advanced to the second part of duodenum. The upper GI                        endoscopy was accomplished without difficulty. The                        patient tolerated the procedure fairly well. Findings:      The Z-line was regular and was found 34 cm from the incisors.      The examined esophagus was normal.      A large hiatal hernia was present.      Two non-bleeding linear gastric ulcers with no stigmata of bleeding were       found in the gastric fundus. The largest lesion was 10 mm in largest       dimension.      Segmental moderate inflammation characterized by congestion (edema),       erosions, erythema and linear erosions was found in the gastric fundus.      The gastric antrum was normal.      The examined duodenum was normal. Impression:           - Z-line regular, 34 cm from the incisors.                       - Normal esophagus.                       - Large hiatal hernia.                       - Non-bleeding gastric ulcers with no stigmata of                        bleeding.                       - Acute gastritis.                       - Normal antrum.                       - Normal examined duodenum.                       - No specimens collected. Recommendation:       - Clear liquid diet.                       - Advance diet as tolerated. Procedure Code(s):    --- Professional ---                       [phone], Esophagogastroduodenoscopy, flexible, transoral;  diagnostic, including collection of specimen(s) by                        brushing or washing, when performed (separate procedure) Diagnosis Code(s):    --- Professional ---                       K92.1, Melena (includes Hematochezia)                       K25.9, Gastric ulcer, unspecified as acute or chronic,                        without hemorrhage or perforation                       K29.00, Acute gastritis without bleeding                        K44.9, Diaphragmatic hernia without obstruction or                        gangrene CPT copyright 2016 [employer] Association. All rights reserved. The codes documented in this report are preliminary and upon coder review may  be revised to meet current compliance requirements. Lear Ng, MD 02/05/2017 12:36:35 PM This report has been signed electronically. Number of Addenda: 0 Note Initiated On: 02/05/2017 11:08 AM      [facility]

## 2017-02-05 NOTE — Progress Notes (Signed)
Pt arrived back to room 155 from endo, VSS, clear liquid tray ordered. No complaints of pain at this time. Bed in lowest positional and call bell within reach.

## 2017-02-05 NOTE — Progress Notes (Signed)
[place] [facility] [place] NAME: Sara Fields    MR#:  [id number]  DATE OF BIRTH:  21-May-1941  SUBJECTIVE:  Came in after having increasing dark colored stools. Chronic IDA No more diarrhea. No fever. No recent travel or abxs  REVIEW OF SYSTEMS:   Review of Systems  Constitutional: Negative for chills, fever and weight loss.  HENT: Negative for ear discharge, ear pain and nosebleeds.   Eyes: Negative for blurred vision, pain and discharge.  Respiratory: Negative for sputum production, shortness of breath, wheezing and stridor.   Cardiovascular: Negative for chest pain, palpitations, orthopnea and PND.  Gastrointestinal: Positive for blood in stool and melena. Negative for abdominal pain, diarrhea, nausea and vomiting.  Genitourinary: Negative for frequency and urgency.  Musculoskeletal: Negative for back pain and joint pain.  Neurological: Positive for weakness. Negative for sensory change, speech change and focal weakness.  Psychiatric/Behavioral: Negative for depression and hallucinations. The patient is not nervous/anxious.    Tolerating Diet: Tolerating PT:   DRUG ALLERGIES:  No Known Allergies  VITALS:  Blood pressure 127/63, pulse 88, temperature 98.7 F (37.1 C), temperature source Oral, resp. rate 18, height 5\' 6"  (1.676 m), weight 92.1 kg (203 lb), SpO2 100 %.  PHYSICAL EXAMINATION:   Physical Exam  GENERAL:  76 y.o.-year-old patient lying in the bed with no acute distress.  EYES: Pupils equal, round, reactive to light and accommodation. No scleral icterus. Extraocular muscles intact.  HEENT: Head atraumatic, normocephalic. Oropharynx and nasopharynx clear.  NECK:  Supple, no jugular venous distention. No thyroid enlargement, no tenderness.  LUNGS: Normal breath sounds bilaterally, no wheezing, rales, rhonchi. No use of accessory muscles of respiration.  CARDIOVASCULAR: S1, S2 normal. No murmurs, rubs, or gallops.   ABDOMEN: Soft, nontender, nondistended. Bowel sounds present. No organomegaly or mass.  EXTREMITIES: No cyanosis, clubbing or edema b/l.    NEUROLOGIC: Cranial nerves II through XII are intact. No focal Motor or sensory deficits b/l.   PSYCHIATRIC:  patient is alert and oriented x 3.  SKIN: No obvious rash, lesion, or ulcer.   LABORATORY PANEL:  CBC  Recent Labs Lab 02/04/17 1342  02/04/17 2257  WBC 11.5*  --   --   HGB 8.0*  < > 7.1*  HCT 24.0*  < > 20.2*  PLT 310  --   --   < > = values in this interval not displayed.  Chemistries   Recent Labs Lab 02/04/17 1342  NA 139  K 4.2  CL 107  CO2 23  GLUCOSE 157*  BUN 47*  CREATININE 1.09*  CALCIUM 8.9  AST 26  ALT 13*  ALKPHOS 59  BILITOT 0.6   Cardiac Enzymes No results for input(s): TROPONINI in the last 168 hours. RADIOLOGY:  No results found. ASSESSMENT AND PLAN:  76 year old female with a history of thyroid cancer in remission and iron deficiency anemia who presents with melena.  1. Melena concerning for upper GI bleed: pt has h/o Gastritis and HH -11.2 (march 2018)---8.0---7.7---7.1---2 units PRBC--- hgb pending GI consultation appreciated. EGD today Continue PPI gtt  2. Acute on chronic blood loss anemia due to problem #1 and hx of MGUS Patient is being transfused, consented by ER physician. Monitor hemoglobin Patient follows with oncology for iron deficiency anemia and MGUS  3. Essential hypertension: Due to GI bleed due to low/normal BP will hold HTN meds  4. Hypothryoid: Continue Synthroid   5. History of pulmonary fibrosis: Patient will need  outpatient follow-up with pulmonology. Daughter was unaware of diagnosis.  6. Hyperlipidemia: Continue Crestor  Case discussed with Care Management/Social Worker. Management plans discussed with the patient, family and they are in agreement.  CODE STATUS: limited  DVT Prophylaxis: SCD  TOTAL TIME TAKING CARE OF THIS PATIENT: 30 minutes.  >50%  time spent on counselling and coordination of care. D/W daughter  POSSIBLE D/C IN 1-2  DAYS, DEPENDING ON CLINICAL CONDITION.  Note: This dictation was prepared with Dragon dictation along with smaller phrase technology. Any transcriptional errors that result from this process are unintentional.  Zalia Hautala M.D on 02/05/2017 at 8:41 AM  Between 7am to 6pm - Pager - [phone]  After 6pm go to [email or web address] - password [email or web address]  Office  [phone]  CC: Primary care physician; Adin Hector, MD

## 2017-02-05 NOTE — Interval H&P Note (Signed)
History and Physical Interval Note:  02/05/2017 11:53 AM  Sara Fields  has presented today for surgery, with the diagnosis of melena  The various methods of treatment have been discussed with the patient and family. After consideration of risks, benefits and other options for treatment, the patient has consented to  Procedure(s): ESOPHAGOGASTRODUODENOSCOPY (EGD) (N/A) as a surgical intervention .  The patient's history has been reviewed, patient examined, no change in status, stable for surgery.  I have reviewed the patient's chart and labs.  Questions were answered to the patient's satisfaction.     [place] C.

## 2017-02-05 NOTE — Anesthesia Postprocedure Evaluation (Signed)
Anesthesia Post Note  Patient: BORGHILD THAKER  Procedure(s) Performed: Procedure(s) (LRB): ESOPHAGOGASTRODUODENOSCOPY (EGD) (N/A)  Patient location during evaluation: Endoscopy Anesthesia Type: General Level of consciousness: awake and alert and oriented Pain management: pain level controlled Vital Signs Assessment: post-procedure vital signs reviewed and stable Respiratory status: spontaneous breathing, nonlabored ventilation and respiratory function stable Cardiovascular status: blood pressure returned to baseline and stable Postop Assessment: no signs of nausea or vomiting Anesthetic complications: no     Last Vitals:  Vitals:   02/05/17 0714 02/05/17 1046  BP: 127/63 131/67  Pulse: 88 87  Resp: 18 18  Temp: 37.1 C     Last Pain:  Vitals:   02/05/17 1240  TempSrc:   PainSc: Asleep                 Lily Velasquez

## 2017-02-05 NOTE — Progress Notes (Signed)
SVN given for mild sob.  No wheezing noted. Pt did have some fine crackles in LLL.  Instructed her on doing deep breathing & cough Q1H WA.

## 2017-02-05 NOTE — Anesthesia Post-op Follow-up Note (Signed)
Anesthesia QCDR form completed.        

## 2017-02-05 NOTE — Progress Notes (Signed)
Verbal order to D/C enteric precaution from MD Patel. Order D/C

## 2017-02-05 NOTE — H&P (View-Only) (Signed)
Referring Provider: Dr. Benjie Karvonen Primary Care Physician:  Adin Hector, MD Primary Gastroenterologist:  Althia Forts  Reason for Consultation:  Melena  HPI: Sara Fields is a 76 y.o. female with history of iron deficiency anemia on iron therapy, history of thyroid cancer and HTN seen for a consult due to a GI bleed. Has chronically black stools but black stools increased overnight and she had weakness and dizziness overnight. Denies abdominal pain, nausea, vomiting, hematemesis, or hematochezia. Has intermittent solid-food dysphagia that improved after dilation of an esophageal ring in January 2017. Esophageal ring dilated to 18 mm with a TTS balloon during that EGD. Large hiatal hernia and mild erosive esophagitis also found in the distal esophagus. History of heartburn and reports that she is on Prilosec at home. Started having loose stools yesterday.  Hgb 8.0  (11.2 on 12/26/16) and heme positive in ER. Hgb 7.7 on recheck. Reportedly had diverticulosis and hemorrhoids on a colonoscopy in 2011. Denies NSAIDs. Denies any known history of peptic ulcer disease.   Past Medical History:  Diagnosis Date  . Anemia   . Asthma   . GERD (gastroesophageal reflux disease)   . Hyperlipidemia   . Hypertension   . Thyroid cancer ([place]) 1999 and 2015   Partial thyroidectomy with rad tx's.     Past Surgical History:  Procedure Laterality Date  . ABDOMINAL HYSTERECTOMY  1973   partial  . ESOPHAGOGASTRODUODENOSCOPY (EGD) WITH PROPOFOL N/A 11/16/2015   Procedure: ESOPHAGOGASTRODUODENOSCOPY (EGD) WITH PROPOFOL;  Surgeon: Josefine Class, MD;  Location: [facility] ENDOSCOPY;  Service: Endoscopy;  Laterality: N/A;  . THYROID SURGERY  1999 and 2015   Partial Thyroidectomy    Prior to Admission medications   Medication Sig Start Date End Date Taking? Authorizing Provider  albuterol (PROVENTIL HFA;VENTOLIN HFA) 108 (90 Base) MCG/ACT inhaler Inhale into the lungs every 6 (six) hours as needed for wheezing or  shortness of breath.   Yes Historical Provider, MD  allopurinol (ZYLOPRIM) 100 MG tablet Take 100 mg by mouth daily.   Yes Historical Provider, MD  amLODipine (NORVASC) 5 MG tablet Take 1 tablet by mouth. 05/27/14  Yes Historical Provider, MD  azelastine (ASTELIN) 0.1 % nasal spray Place 1 spray into both nostrils daily as needed for rhinitis or allergies.  09/28/15  Yes Historical Provider, MD  budesonide-formoterol (SYMBICORT) 160-4.5 MCG/ACT inhaler Inhale 2 puffs into the lungs 2 (two) times daily.   Yes Historical Provider, MD  bumetanide (BUMEX) 1 MG tablet Take 1 mg by mouth daily.   Yes Historical Provider, MD  calcium carbonate (CALCIUM 600) 600 MG TABS tablet Take 600 mg by mouth 2 (two) times daily with a meal.   Yes Historical Provider, MD  levothyroxine (SYNTHROID, LEVOTHROID) 50 MCG tablet Take 50 mcg by mouth.  10/05/15  Yes Historical Provider, MD  lisinopril (PRINIVIL,ZESTRIL) 20 MG tablet Take 20 mg by mouth daily.   Yes Historical Provider, MD  montelukast (SINGULAIR) 10 MG tablet Take 10 mg by mouth at bedtime.   Yes Historical Provider, MD  potassium chloride SA (KLOR-CON M20) 20 MEQ tablet Take 20 mEq by mouth daily.  10/15/15  Yes Historical Provider, MD  propranolol (INDERAL) 20 MG tablet Take 20 mg by mouth 3 (three) times daily.   Yes Historical Provider, MD  rosuvastatin (CRESTOR) 10 MG tablet Take 10 mg by mouth daily.  09/06/16 09/06/17 Yes Historical Provider, MD  traMADol (ULTRAM) 50 MG tablet Take 50 mg by mouth every 6 (six) hours as needed.  Yes Historical Provider, MD  Turmeric 500 MG TABS Take 1 tablet by mouth daily.   Yes Historical Provider, MD    Scheduled Meds: . sodium chloride  10 mL/hr Intravenous Once  . allopurinol  100 mg Oral Daily  . [START ON 02/05/2017] levothyroxine  50 mcg Oral QAC breakfast  . [START ON 02/08/2017] pantoprazole  40 mg Intravenous Q12H  . pantoprazole (PROTONIX) IV  40 mg Intravenous Q12H  . rosuvastatin  10 mg Oral Daily    Continuous Infusions: . sodium chloride 50 mL/hr at 02/04/17 1709  . pantoprozole (PROTONIX) infusion 8 mg/hr (02/04/17 1600)   PRN Meds:.acetaminophen **OR** acetaminophen, hydrALAZINE, HYDROcodone-acetaminophen, ondansetron **OR** ondansetron (ZOFRAN) IV, senna-docusate  Allergies as of 02/04/2017  . (No Known Allergies)    Family History  Problem Relation Age of Onset  . Lung cancer Father   . Hypertension Father   . Bone cancer Sister   . Hypertension Sister   . Lung cancer Brother   . Hypertension Brother   . Kidney cancer Sister   . Hypertension Sister   . Lung cancer Sister   . Hypertension Sister   . Breast cancer Neg Hx     Social History   Social History  . Marital status: Married    Spouse name: N/A  . Number of children: N/A  . Years of education: N/A   Occupational History  . Not on file.   Social History Main Topics  . Smoking status: Never Smoker  . Smokeless tobacco: Never Used  . Alcohol use No  . Drug use: No  . Sexual activity: Not on file   Other Topics Concern  . Not on file   Social History Narrative  . No narrative on file    Review of Systems: All negative except as stated above in HPI.  Physical Exam: Vital signs: Vitals:   02/04/17 1600 02/04/17 1628  BP: 122/90 124/61  Pulse: (!) 121 92  Resp: (!) 26 17  Temp:    T 97.8  Last BM Date: 02/04/17 General:   Lethargic, elderly, Well-developed, well-nourished, pleasant and cooperative in NAD HEENT: anicteric sclera, oropharynx clear Neck: supple, nontender Lungs:  Clear throughout to auscultation.   No wheezes, crackles, or rhonchi. No acute distress. Heart:  Regular rate and rhythm; no murmurs, clicks, rubs,  or gallops. Abdomen: minimal RLQ tenderness with voluntary guarding, soft, nondistended, +BS  Rectal:  Deferred Ext: no edema  GI:  Lab Results:  Recent Labs  02/04/17 1342 02/04/17 1810  WBC 11.5*  --   HGB 8.0* 7.7*  HCT 24.0* 22.6*  PLT 310  --     BMET  Recent Labs  02/04/17 1342  NA 139  K 4.2  CL 107  CO2 23  GLUCOSE 157*  BUN 47*  CREATININE 1.09*  CALCIUM 8.9   LFT  Recent Labs  02/04/17 1342  PROT 7.1  ALBUMIN 3.6  AST 26  ALT 13*  ALKPHOS 59  BILITOT 0.6   PT/INR No results for input(s): LABPROT, INR in the last 72 hours.   Studies/Results: No results found.  Impression/Plan: 76 yo with melenic stools, weakness, and heme positive. On iron pills but symptomatic with a 3 g Hgb drop in the last months. Question peptic ulcer source vs gastritis. Doubt AVMs. Continue Protonix 40 mg IV Q 12 hours. NPO p MN. EGD tomorrow morning to evaluate for ulcers. Supportive care.     LOS: 0 days   [place] C.  02/04/2017, 6:59 PM

## 2017-02-05 NOTE — Anesthesia Preprocedure Evaluation (Signed)
Anesthesia Evaluation  Patient identified by MRN, date of birth, ID band Patient awake    Reviewed: Allergy & Precautions, NPO status , Patient's Chart, lab work & pertinent test results  History of Anesthesia Complications Negative for: history of anesthetic complications  Airway Mallampati: II  TM Distance: >3 FB Neck ROM: Full    Dental  (+) Edentulous Upper, Edentulous Lower   Pulmonary asthma , neg sleep apnea,    breath sounds clear to auscultation- rhonchi (-) wheezing      Cardiovascular hypertension, Pt. on medications (-) CAD and (-) Past MI  Rhythm:Regular Rate:Normal - Systolic murmurs and - Diastolic murmurs    Neuro/Psych negative neurological ROS  negative psych ROS   GI/Hepatic Neg liver ROS, GERD  ,GIB   Endo/Other  neg diabetesHypothyroidism   Renal/GU negative Renal ROS     Musculoskeletal  (+) Arthritis ,   Abdominal (+) + obese,   Peds  Hematology  (+) anemia ,   Anesthesia Other Findings Past Medical History: No date: Anemia No date: Asthma No date: GERD (gastroesophageal reflux disease) No date: Hyperlipidemia No date: Hypertension 1999 and 2015: Thyroid cancer ([place])     Comment: Partial thyroidectomy with rad tx's.    Reproductive/Obstetrics                             Anesthesia Physical Anesthesia Plan  ASA: III  Anesthesia Plan: General   Post-op Pain Management:    Induction: Intravenous  Airway Management Planned: Natural Airway  Additional Equipment:   Intra-op Plan:   Post-operative Plan:   Informed Consent: I have reviewed the patients History and Physical, chart, labs and discussed the procedure including the risks, benefits and alternatives for the proposed anesthesia with the patient or authorized representative who has indicated his/her understanding and acceptance.   Dental advisory given  Plan Discussed with: CRNA and  Anesthesiologist  Anesthesia Plan Comments:         Anesthesia Quick Evaluation

## 2017-02-05 NOTE — Brief Op Note (Signed)
Cameron erosions and ulcerations in proximal stomach likely source of recent blood loss. Large hiatal hernia. No blood products on EGD. Clear liquid diet and advance as tolerated. D/C Protonix drip and change to IV Q 12 hours until on solid food and then change to PO BID. GI will f/u tomorrow.

## 2017-02-06 ENCOUNTER — Encounter: Payer: Self-pay | Admitting: Gastroenterology

## 2017-02-06 DIAGNOSIS — K921 Melena: Principal | ICD-10-CM

## 2017-02-06 LAB — BPAM RBC
BLOOD PRODUCT EXPIRATION DATE: 201805052359
Blood Product Expiration Date: 201805052359
ISSUE DATE / TIME: 201804150355
ISSUE DATE / TIME: 201804150111
UNIT TYPE AND RH: 5100
UNIT TYPE AND RH: 5100

## 2017-02-06 LAB — TYPE AND SCREEN
ABO/RH(D): O POS
Antibody Screen: NEGATIVE
Unit division: 0
Unit division: 0

## 2017-02-06 LAB — CREATININE, SERUM: Creatinine, Ser: 0.84 mg/dL (ref 0.44–1.00)

## 2017-02-06 LAB — HEMOGLOBIN AND HEMATOCRIT, BLOOD
HCT: 25.6 % — ABNORMAL LOW (ref 35.0–47.0)
Hemoglobin: 9.1 g/dL — ABNORMAL LOW (ref 12.0–16.0)

## 2017-02-06 MED ORDER — PANTOPRAZOLE SODIUM 40 MG PO TBEC: 40.0000 mg | DELAYED_RELEASE_TABLET | Freq: Two times a day (BID) | ORAL | 1 refills | Status: AC

## 2017-02-06 MED ORDER — PANTOPRAZOLE SODIUM 40 MG PO TBEC: 40.0000 mg | DELAYED_RELEASE_TABLET | Freq: Two times a day (BID) | ORAL | Status: DC

## 2017-02-06 NOTE — Progress Notes (Signed)
Patient is alert and oriented and able to verbalize needs. No complaints of pain at this time. Vital signs stable. PIV removed. Daughter at bedside. Discharge instructions gone over with patient and daughter. Patient verbalizes understanding of all instructions, follow up care and where to pick up new medication. No concerns voiced at this time. Daughter will transport patient to home.

## 2017-02-06 NOTE — Progress Notes (Addendum)
  Jonathon Bellows MD [address], [place], [place] [address] Phone: [phone] Fax : [phone]  Sara Fields is being followed for gi bleed  Day 2 of follow up   Subjective: No further bowel movements, having her breakfast today , had clears yesterday . Denies any rectal bleeding    Objective: Vital signs in last 24 hours: Vitals:   02/05/17 1310 02/05/17 1338 02/05/17 2249 02/06/17 0740  BP: 118/62 122/65 138/65 (!) 141/66  Pulse: 77 76 79 80  Resp:  18 18 19   Temp:  98.4 F (36.9 C) 97.9 F (36.6 C) 99.2 F (37.3 C)  TempSrc:  Oral Oral Oral  SpO2: 100% 99% 100% 99%  Weight:      Height:       Weight change:   Intake/Output Summary (Last 24 hours) at 02/06/17 0841 Last data filed at 02/05/17 1847  Gross per 24 hour  Intake              300 ml  Output             1100 ml  Net             -800 ml     Exam: Heart:: Regular rate and rhythm, S1S2 present or without murmur or extra heart sounds Lungs: normal, clear to auscultation and clear to auscultation and percussion Abdomen: soft, nontender, normal bowel sounds   Lab Results: @LABTEST2 @ Micro Results: No results found for this or any previous visit (from the past 240 hour(s)). Studies/Results: No results found. Medications: I have reviewed the patient's current medications. Scheduled Meds: . allopurinol  100 mg Oral Daily  . levothyroxine  50 mcg Oral QAC breakfast  . pantoprazole (PROTONIX) IV  40 mg Intravenous Q12H  . rosuvastatin  10 mg Oral Daily   Continuous Infusions: PRN Meds:.acetaminophen **OR** acetaminophen, hydrALAZINE, HYDROcodone-acetaminophen, ipratropium-albuterol, ondansetron **OR** ondansetron (ZOFRAN) IV, senna-docusate  CBC Latest Ref Rng & Units 02/06/2017 02/05/2017 02/05/2017  WBC 3.6 - 11.0 K/uL - - -  Hemoglobin 12.0 - 16.0 g/dL 9.1(L) 9.9(L) 9.7(L)  Hematocrit 35.0 - 47.0 % 25.6(L) 28.3(L) 28.0(L)  Platelets 150 - 440 K/uL - - -    Assessment: Active Problems:  GIB (gastrointestinal bleeding)   Melena Sara Fields  76 y.o. admitted with melena, EGD showed gastric ulcers with no active bleeding .    Plan: 1. On IV PPi, diet advanced , monitor CBC if stable can go home with follow up in [facility] as she sees them . Will need PCP to check Hb in 2-3 days time to ensure its not dropping further.  2. H pylori stool antigen  3. No NSAID's .  4. If further Hb is dropping and there is concern for bleeding will need a colonoscopy  5. Check iron studies and if iron deficient may benefit from IV iron.  I will sign off.  Please call me if any further GI concerns or questions.  We would like to thank you for the opportunity to participate in the care of Sara Fields.     LOS: 2 days   Jonathon Bellows 02/06/2017, 8:41 AM

## 2017-02-06 NOTE — Discharge Summary (Signed)
[facility] NAME: Sara Fields    MR#:  [id number]  DATE OF BIRTH:  1941/09/25  DATE OF ADMISSION:  02/04/2017 ADMITTING PHYSICIAN: Bettey Costa, MD  DATE OF DISCHARGE: 4/10/29/16  PRIMARY CARE PHYSICIAN: [name], MD    ADMISSION DIAGNOSIS:  UGIB (upper gastrointestinal bleed) [K92.2]  DISCHARGE DIAGNOSIS:  Upper GI bleed due to Gastric ulceration and cameron lesion s/p 2 unit BT h/o IDA SECONDARY DIAGNOSIS:   Past Medical History:  Diagnosis Date  . Anemia   . Asthma   . GERD (gastroesophageal reflux disease)   . Hyperlipidemia   . Hypertension   . Thyroid cancer ([place]) 1999 and 2015   Partial thyroidectomy with rad tx's.     HOSPITAL COURSE:  76 year old female with a history of thyroid cancer in remission and iron deficiency anemia who presents with melena.  1. Melena concerning for upper GI bleed: pt has h/o Gastritis and HH -11.2 (march 2018)---8.0---7.7---7.1---2 units PRBC--- 9.1 GI consultation appreciated. - EGD showed Cameron erosions and ulcerations in proximal stomach likely source of recent blood loss. Large hiatal hernia -Continue PPI gtt---change to oral ppi bid -no further w/u per GI  2. Acute on chronic blood loss anemia due to problem #1 and hx of MGUS s/p 2 unit BT Patient follows with oncology for iron deficiency anemia and MGUS  3.Essential hypertension: Resume home meds  4. Hypothryoid:Continue Synthroid   5. History of pulmonary fibrosis: Patient will need outpatient follow-up with pulmonology. Daughter was unaware of diagnosis.  6. Hyperlipidemia: Continue Crestor  Overall stable D/c home. D/w pt and dter D/w Dr Vicente Males  CONSULTS OBTAINED:    DRUG ALLERGIES:  No Known Allergies  DISCHARGE MEDICATIONS:   Current Discharge Medication List    START taking these medications   Details  pantoprazole (PROTONIX) 40 MG tablet Take 1 tablet (40 mg total) by mouth  2 (two) times daily before a meal. Qty: 60 tablet, Refills: 1      CONTINUE these medications which have NOT CHANGED   Details  albuterol (PROVENTIL HFA;VENTOLIN HFA) 108 (90 Base) MCG/ACT inhaler Inhale into the lungs every 6 (six) hours as needed for wheezing or shortness of breath.    allopurinol (ZYLOPRIM) 100 MG tablet Take 100 mg by mouth daily.    amLODipine (NORVASC) 5 MG tablet Take 1 tablet by mouth.    azelastine (ASTELIN) 0.1 % nasal spray Place 1 spray into both nostrils daily as needed for rhinitis or allergies.     budesonide-formoterol (SYMBICORT) 160-4.5 MCG/ACT inhaler Inhale 2 puffs into the lungs 2 (two) times daily.    bumetanide (BUMEX) 1 MG tablet Take 1 mg by mouth daily.    calcium carbonate (CALCIUM 600) 600 MG TABS tablet Take 600 mg by mouth 2 (two) times daily with a meal.    levothyroxine (SYNTHROID, LEVOTHROID) 50 MCG tablet Take 50 mcg by mouth.     lisinopril (PRINIVIL,ZESTRIL) 20 MG tablet Take 20 mg by mouth daily.    montelukast (SINGULAIR) 10 MG tablet Take 10 mg by mouth at bedtime.    potassium chloride SA (KLOR-CON M20) 20 MEQ tablet Take 20 mEq by mouth daily.     propranolol (INDERAL) 20 MG tablet Take 20 mg by mouth 3 (three) times daily.    rosuvastatin (CRESTOR) 10 MG tablet Take 10 mg by mouth daily.     traMADol (ULTRAM) 50 MG tablet Take 50 mg by mouth every 6 (six)  hours as needed.    Turmeric 500 MG TABS Take 1 tablet by mouth daily.        If you experience worsening of your admission symptoms, develop shortness of breath, life threatening emergency, suicidal or homicidal thoughts you must seek medical attention immediately by calling 911 or calling your MD immediately  if symptoms less severe.  You Must read complete instructions/literature along with all the possible adverse reactions/side effects for all the Medicines you take and that have been prescribed to you. Take any new Medicines after you have completely understood  and accept all the possible adverse reactions/side effects.   Please note  You were cared for by a hospitalist during your hospital stay. If you have any questions about your discharge medications or the care you received while you were in the hospital after you are discharged, you can call the unit and asked to speak with the hospitalist on call if the hospitalist that took care of you is not available. Once you are discharged, your primary care physician will handle any further medical issues. Please note that NO REFILLS for any discharge medications will be authorized once you are discharged, as it is imperative that you return to your primary care physician (or establish a relationship with a primary care physician if you do not have one) for your aftercare needs so that they can reassess your need for medications and monitor your lab values. Today   SUBJECTIVE   Doing well. Tolerating diet VITAL SIGNS:  Blood pressure 133/69, pulse (!) 102, temperature 99.2 F (37.3 C), temperature source Oral, resp. rate 19, height 5\' 6"  (1.676 m), weight 92.1 kg (203 lb), SpO2 100 %.  I/O:   Intake/Output Summary (Last 24 hours) at 02/06/17 1058 Last data filed at 02/06/17 0900  Gross per 24 hour  Intake              520 ml  Output              700 ml  Net             -180 ml    PHYSICAL EXAMINATION:  GENERAL:  76 y.o.-year-old patient lying in the bed with no acute distress.  EYES: Pupils equal, round, reactive to light and accommodation. No scleral icterus. Extraocular muscles intact.  HEENT: Head atraumatic, normocephalic. Oropharynx and nasopharynx clear.  NECK:  Supple, no jugular venous distention. No thyroid enlargement, no tenderness.  LUNGS: Normal breath sounds bilaterally, no wheezing, rales,rhonchi or crepitation. No use of accessory muscles of respiration.  CARDIOVASCULAR: S1, S2 normal. No murmurs, rubs, or gallops.  ABDOMEN: Soft, non-tender, non-distended. Bowel sounds present.  No organomegaly or mass.  EXTREMITIES: No pedal edema, cyanosis, or clubbing.  NEUROLOGIC: Cranial nerves II through XII are intact. Muscle strength 5/5 in all extremities. Sensation intact. Gait not checked.  PSYCHIATRIC: The patient is alert and oriented x 3.  SKIN: No obvious rash, lesion, or ulcer.   DATA REVIEW:   CBC   Recent Labs Lab 02/05/17 0845  02/06/17 0416  WBC 9.1  --   --   HGB 9.8*  < > 9.1*  HCT 28.6*  < > 25.6*  PLT 265  --   --   < > = values in this interval not displayed.  Chemistries   Recent Labs Lab 02/04/17 1342 02/05/17 0845 02/06/17 0416  NA 139 143  --   K 4.2 3.5  --   CL 107 114*  --  CO2 23 22  --   GLUCOSE 157* 99  --   BUN 47* 19  --   CREATININE 1.09* 0.93 0.84  CALCIUM 8.9 8.9  --   AST 26  --   --   ALT 13*  --   --   ALKPHOS 59  --   --   BILITOT 0.6  --   --     Microbiology Results   No results found for this or any previous visit (from the past 240 hour(s)).  RADIOLOGY:  No results found.   Management plans discussed with the patient, family and they are in agreement.  CODE STATUS:     Code Status Orders        Start     Ordered   02/04/17 1510  Limited resuscitation (code)  Continuous    Question Answer Comment  In the event of cardiac or respiratory ARREST: Initiate Code Blue, Call Rapid Response Yes   In the event of cardiac or respiratory ARREST: Perform CPR Yes   In the event of cardiac or respiratory ARREST: Perform Intubation/Mechanical Ventilation No   In the event of cardiac or respiratory ARREST: Use NIPPV/BiPAp only if indicated Yes   In the event of cardiac or respiratory ARREST: Administer ACLS medications if indicated Yes   In the event of cardiac or respiratory ARREST: Perform Defibrillation or Cardioversion if indicated Yes      02/04/17 1509    Code Status History    Date Active Date Inactive Code Status Order ID Comments User Context   This patient has a current code status but no  historical code status.      TOTAL TIME TAKING CARE OF THIS PATIENT: 40 minutes.    Taylen Osorto M.D on 02/06/2017 at 10:58 AM  Between 7am to 6pm - Pager - [phone] After 6pm go to [email or web address] - password [email or web address] [facility]  Office  [phone]  CC: Primary care physician; Adin Hector, MD

## 2017-02-08 LAB — H. PYLORI ANTIGEN, STOOL: H. PYLORI STOOL AG, EIA: NEGATIVE

## 2017-02-22 DIAGNOSIS — K250 Acute gastric ulcer with hemorrhage: Secondary | ICD-10-CM | POA: Insufficient documentation

## 2017-03-09 ENCOUNTER — Ambulatory Visit: Payer: Medicare Other | Admitting: Gastroenterology

## 2017-03-09 DIAGNOSIS — D649 Anemia, unspecified: Secondary | ICD-10-CM | POA: Insufficient documentation

## 2017-03-09 NOTE — Progress Notes (Deleted)
She is here today for a hospital follow up   Summary of history :  She was admitted in 01/2017 and seen by Dr Michail Sermon on 02/04/17 when she was noted to have melena. She has a history of iron deficiency anemia, dysphagia. EGD showed large hiatal hernia, gastric ulcers with no active bleeding. Plan at discharge was to check H pylori stool antigen, no nsaids and follow up as an out patient.She is seen by oncology for MGUS and iron deficiency anemia.    Interval history   02/06/2017-  03/09/2017    H pylori stool antigen was negative

## 2017-03-27 ENCOUNTER — Ambulatory Visit: Payer: Self-pay | Admitting: Internal Medicine

## 2017-03-27 ENCOUNTER — Ambulatory Visit: Payer: Self-pay

## 2017-04-17 ENCOUNTER — Inpatient Hospital Stay: Payer: Medicare Other | Attending: Internal Medicine

## 2017-04-17 ENCOUNTER — Ambulatory Visit: Payer: Medicare Other | Admitting: Gastroenterology

## 2017-04-17 DIAGNOSIS — C73 Malignant neoplasm of thyroid gland: Secondary | ICD-10-CM | POA: Diagnosis not present

## 2017-04-17 LAB — CBC WITH DIFFERENTIAL/PLATELET
Basophils Absolute: 0.1 10*3/uL (ref 0–0.1)
Basophils Relative: 1 %
Eosinophils Absolute: 0.2 10*3/uL (ref 0–0.7)
Eosinophils Relative: 2 %
HCT: 33.5 % — ABNORMAL LOW (ref 35.0–47.0)
HEMOGLOBIN: 11.5 g/dL — AB (ref 12.0–16.0)
LYMPHS ABS: 0.7 10*3/uL — AB (ref 1.0–3.6)
LYMPHS PCT: 11 %
MCH: 30.7 pg (ref 26.0–34.0)
MCHC: 34.4 g/dL (ref 32.0–36.0)
MCV: 89.2 fL (ref 80.0–100.0)
MONOS PCT: 7 %
Monocytes Absolute: 0.5 10*3/uL (ref 0.2–0.9)
NEUTROS PCT: 79 %
Neutro Abs: 5.3 10*3/uL (ref 1.4–6.5)
Platelets: 332 10*3/uL (ref 150–440)
RBC: 3.75 MIL/uL — AB (ref 3.80–5.20)
RDW: 15.2 % — ABNORMAL HIGH (ref 11.5–14.5)
WBC: 6.7 10*3/uL (ref 3.6–11.0)

## 2017-04-17 LAB — IRON AND TIBC
IRON: 54 ug/dL (ref 28–170)
Saturation Ratios: 17 % (ref 10.4–31.8)
TIBC: 310 ug/dL (ref 250–450)
UIBC: 256 ug/dL

## 2017-04-17 LAB — COMPREHENSIVE METABOLIC PANEL
ALK PHOS: 88 U/L (ref 38–126)
ALT: 9 U/L — AB (ref 14–54)
ANION GAP: 9 (ref 5–15)
AST: 21 U/L (ref 15–41)
Albumin: 4.3 g/dL (ref 3.5–5.0)
BILIRUBIN TOTAL: 0.6 mg/dL (ref 0.3–1.2)
BUN: 16 mg/dL (ref 6–20)
CALCIUM: 9.4 mg/dL (ref 8.9–10.3)
CO2: 26 mmol/L (ref 22–32)
CREATININE: 0.99 mg/dL (ref 0.44–1.00)
Chloride: 103 mmol/L (ref 101–111)
GFR, EST NON AFRICAN AMERICAN: 54 mL/min — AB (ref >60)
Glucose, Bld: 95 mg/dL (ref 65–99)
Potassium: 4 mmol/L (ref 3.5–5.1)
Sodium: 138 mmol/L (ref 135–145)
TOTAL PROTEIN: 7.9 g/dL (ref 6.5–8.1)

## 2017-04-17 LAB — FERRITIN: FERRITIN: 213 ng/mL (ref 11–307)

## 2017-04-18 LAB — THYROID PANEL WITH TSH
Free Thyroxine Index: 1.8 (ref 1.2–4.9)
T3 UPTAKE RATIO: 25 % (ref 24–39)
T4 TOTAL: 7 ug/dL (ref 4.5–12.0)
TSH: 5.85 u[IU]/mL — ABNORMAL HIGH (ref 0.450–4.500)

## 2017-04-18 LAB — TGAB+THYROGLOBULIN IMA OR RIA

## 2017-04-18 LAB — THYROGLOBULIN BY IMA: Thyroglobulin by IMA: 28.1 ng/mL (ref 1.5–38.5)

## 2017-04-24 ENCOUNTER — Inpatient Hospital Stay: Payer: Medicare Other

## 2017-04-24 ENCOUNTER — Inpatient Hospital Stay: Payer: Medicare Other | Attending: Internal Medicine | Admitting: Internal Medicine

## 2017-04-24 VITALS — BP 126/76 | HR 76 | Temp 97.2°F | Resp 20 | Wt 207.2 lb

## 2017-04-24 DIAGNOSIS — D472 Monoclonal gammopathy: Secondary | ICD-10-CM

## 2017-04-24 DIAGNOSIS — J45909 Unspecified asthma, uncomplicated: Secondary | ICD-10-CM

## 2017-04-24 DIAGNOSIS — Z801 Family history of malignant neoplasm of trachea, bronchus and lung: Secondary | ICD-10-CM | POA: Insufficient documentation

## 2017-04-24 DIAGNOSIS — D509 Iron deficiency anemia, unspecified: Secondary | ICD-10-CM | POA: Insufficient documentation

## 2017-04-24 DIAGNOSIS — K219 Gastro-esophageal reflux disease without esophagitis: Secondary | ICD-10-CM

## 2017-04-24 DIAGNOSIS — E785 Hyperlipidemia, unspecified: Secondary | ICD-10-CM | POA: Insufficient documentation

## 2017-04-24 DIAGNOSIS — Z808 Family history of malignant neoplasm of other organs or systems: Secondary | ICD-10-CM | POA: Diagnosis not present

## 2017-04-24 DIAGNOSIS — C73 Malignant neoplasm of thyroid gland: Secondary | ICD-10-CM | POA: Insufficient documentation

## 2017-04-24 DIAGNOSIS — E890 Postprocedural hypothyroidism: Secondary | ICD-10-CM | POA: Diagnosis not present

## 2017-04-24 DIAGNOSIS — Z79899 Other long term (current) drug therapy: Secondary | ICD-10-CM | POA: Insufficient documentation

## 2017-04-24 DIAGNOSIS — I10 Essential (primary) hypertension: Secondary | ICD-10-CM | POA: Diagnosis not present

## 2017-04-24 DIAGNOSIS — Z923 Personal history of irradiation: Secondary | ICD-10-CM | POA: Diagnosis not present

## 2017-04-24 DIAGNOSIS — R490 Dysphonia: Secondary | ICD-10-CM | POA: Diagnosis not present

## 2017-04-24 DIAGNOSIS — K449 Diaphragmatic hernia without obstruction or gangrene: Secondary | ICD-10-CM | POA: Diagnosis not present

## 2017-04-24 MED ORDER — LEVOTHYROXINE SODIUM 25 MCG PO TABS: ORAL_TABLET | ORAL | 3 refills | Status: DC

## 2017-04-24 NOTE — Progress Notes (Signed)
[place] OFFICE PROGRESS NOTE  Patient Care Team: Adin Hector, MD as PCP - General (Internal Medicine)  Cancer Staging No matching staging information was found for the patient.   Oncology History   1999- right thyroid lobectomy by Dr. Orene Desanctis with unusual poorly differentiated cancer, no additional treatment  2015- recurrent mass in thyroid bed. PET-CT showed FDG avid lesion in thyroid bed + nasopharyx.  2015- Dr. 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 Margins not noted [facility], [place], #[id number]).   01/05/2014- Nasopharyngeal biopsy negative for malignancy  04/25/14- completed postoperative XRT completing 65.25 CGy in 29 fractions  05/2014 + 07/2014- PET avidity at the left nasopharynx and left oropharyngeal soft tissue as well as a hypermetabolic 1.5 cm mass in the right thyroid bed. Dr Johney Frame excised the paratracheal mass on 09/02/2014. Inspect of the nasopharynx at the time of surgery revealed no obvious abnormality and no biopsy was completed.   09/2014- NP avid area biopsied and negative for carcinoma. SHe underwent a thyroid FNA and that was negative for cancer.  # Chronic Anemia- IDA [ EGD- hiatal hernia; Jan 2017- Dr.Rein] AUG 2017- IV Venofer  # AUG 2017- Thyroglobulin 40/Sep 2017 US neck- NED  # Hoarseness of voice- sec to RT [Dr.Bennett; Dec 2017]  # AUG 2017- IgG kappa 0.4gm//N-kappa-Lamda light chains       Malignant neoplasm of thyroid gland ([place])   09/01/2014 Initial Diagnosis    Malignant neoplasm of thyroid gland ([place])        INTERVAL HISTORY:  Sara Fields 76 y.o.  female pleasant patient above history of thyroid cancer; and diagnosis of iron deficiency anemia unclear etiology is here for follow-up.   Patient continues to improve energy levels. She is taking iron  pills twice a day. No constipation. Denies any blood in stools or black stools. She continues to have hoarseness of voice. Not any worse. No unusual chest pain or shortness of breath or cough.  REVIEW OF SYSTEMS:  A complete 10 point review of system is done which is negative except mentioned above/history of present illness.   PAST MEDICAL HISTORY :  Past Medical History:  Diagnosis Date  . Anemia   . Asthma   . GERD (gastroesophageal reflux disease)   . Hyperlipidemia   . Hypertension   . Thyroid cancer ([place]) 1999 and 2015   Partial thyroidectomy with rad tx's.     PAST SURGICAL HISTORY :   Past Surgical History:  Procedure Laterality Date  . ABDOMINAL HYSTERECTOMY  1973   partial  . ESOPHAGOGASTRODUODENOSCOPY N/A 02/05/2017   Procedure: ESOPHAGOGASTRODUODENOSCOPY (EGD);  Surgeon: Wilford Corner, MD;  Location[facility] ENDOSCOPY;  Service: Endoscopy;  Laterality: N/A;  . ESOPHAGOGASTRODUODENOSCOPY (EGD) WITH PROPOFOL N/A 11/16/2015   Procedure: ESOPHAGOGASTRODUODENOSCOPY (EGD) WITH PROPOFOL;  Surgeon: Josefine Class, MD;  Location: [facility] ENDOSCOPY;  Service: Endoscopy;  Laterality: N/A;  . THYROID SURGERY  1999 and 2015   Partial Thyroidectomy    FAMILY HISTORY :   Family History  Problem Relation Age of Onset  . Lung cancer Father   . Hypertension Father   . Bone cancer Sister   . Hypertension Sister   . Lung cancer Brother   . Hypertension Brother   . Kidney cancer Sister   . Hypertension Sister   . Lung cancer Sister   . Hypertension  Sister   . Breast cancer Neg Hx     SOCIAL HISTORY:   Social History  Substance Use Topics  . Smoking status: Never Smoker  . Smokeless tobacco: Never Used  . Alcohol use No    ALLERGIES:  has No Known Allergies.  MEDICATIONS:  Current Outpatient Prescriptions  Medication Sig Dispense Refill  . albuterol (PROVENTIL HFA;VENTOLIN HFA) 108 (90 Base) MCG/ACT inhaler Inhale into the lungs every 6 (six) hours as needed for  wheezing or shortness of breath.    . allopurinol (ZYLOPRIM) 100 MG tablet Take 100 mg by mouth daily.    Marland Kitchen amLODipine (NORVASC) 5 MG tablet Take 1 tablet by mouth.    Marland Kitchen azelastine (ASTELIN) 0.1 % nasal spray Place 1 spray into both nostrils daily as needed for rhinitis or allergies.     . budesonide-formoterol (SYMBICORT) 160-4.5 MCG/ACT inhaler Inhale 2 puffs into the lungs 2 (two) times daily.    . bumetanide (BUMEX) 1 MG tablet Take 1 mg by mouth daily.    . calcium carbonate (CALCIUM 600) 600 MG TABS tablet Take 600 mg by mouth 2 (two) times daily with a meal.    . levothyroxine (SYNTHROID, LEVOTHROID) 50 MCG tablet Take 50 mcg by mouth.     Marland Kitchen lisinopril (PRINIVIL,ZESTRIL) 20 MG tablet Take 20 mg by mouth daily.    Marland Kitchen LORazepam (ATIVAN) 0.5 MG tablet     . meclizine (ANTIVERT) 12.5 MG tablet Take by mouth.    . montelukast (SINGULAIR) 10 MG tablet Take 10 mg by mouth at bedtime.    . pantoprazole (PROTONIX) 40 MG tablet Take 1 tablet (40 mg total) by mouth 2 (two) times daily before a meal. 60 tablet 1  . potassium chloride SA (KLOR-CON M20) 20 MEQ tablet Take 20 mEq by mouth daily.     . propranolol (INDERAL) 20 MG tablet Take 20 mg by mouth 3 (three) times daily.    . rosuvastatin (CRESTOR) 10 MG tablet Take 10 mg by mouth daily.     . traMADol (ULTRAM) 50 MG tablet Take 50 mg by mouth every 6 (six) hours as needed.    . Turmeric 500 MG TABS Take 1 tablet by mouth daily.    Marland Kitchen levothyroxine (SYNTHROID) 25 MCG tablet Take one pill along with 50 mcg pill [total 75 mcg] prior to breakfast 90 tablet 3   No current facility-administered medications for this visit.     PHYSICAL EXAMINATION: ECOG PERFORMANCE STATUS:   BP 126/76 (BP Location: Right Arm, Patient Position: Sitting)   Pulse 76   Temp 97.2 F (36.2 C) (Tympanic)   Resp 20   Wt 207 lb 4 oz (94 kg)   BMI 33.45 kg/m   Filed Weights   04/24/17 1351  Weight: 207 lb 4 oz (94 kg)    GENERAL: Well-nourished well-developed;  Alert, no distress and comfortable.   EYES: no pallor or icterus OROPHARYNX: no thrush or ulceration; good dentition  NECK: supple, no masses felt; Scar from thyroid surgery noted. LYMPH:  no palpable lymphadenopathy in the cervical, axillary or inguinal regions LUNGS: clear to auscultation and  No wheeze or crackles HEART/CVS: regular rate & rhythm and no murmurs; No lower extremity edema ABDOMEN:abdomen soft, non-tender and normal bowel sounds Musculoskeletal:no cyanosis of digits and no clubbing  PSYCH: alert & oriented x 3 with fluent speech NEURO: no focal motor/sensory deficits SKIN:  no rashes or significant lesions  LABORATORY DATA:  I have reviewed the data as listed  Component Value Date/Time   NA 138 04/17/2017 1400   K 4.0 04/17/2017 1400   CL 103 04/17/2017 1400   CO2 26 04/17/2017 1400   GLUCOSE 95 04/17/2017 1400   BUN 16 04/17/2017 1400   CREATININE 0.99 04/17/2017 1400   CALCIUM 9.4 04/17/2017 1400   PROT 7.9 04/17/2017 1400   ALBUMIN 4.3 04/17/2017 1400   AST 21 04/17/2017 1400   ALT 9 (L) 04/17/2017 1400   ALKPHOS 88 04/17/2017 1400   BILITOT 0.6 04/17/2017 1400   GFRNONAA 54 (L) 04/17/2017 1400   GFRAA >60 04/17/2017 1400    No results found for: SPEP, UPEP  Lab Results  Component Value Date   WBC 6.7 04/17/2017   NEUTROABS 5.3 04/17/2017   HGB 11.5 (L) 04/17/2017   HCT 33.5 (L) 04/17/2017   MCV 89.2 04/17/2017   PLT 332 04/17/2017      Chemistry      Component Value Date/Time   NA 138 04/17/2017 1400   K 4.0 04/17/2017 1400   CL 103 04/17/2017 1400   CO2 26 04/17/2017 1400   BUN 16 04/17/2017 1400   CREATININE 0.99 04/17/2017 1400      Component Value Date/Time   CALCIUM 9.4 04/17/2017 1400   ALKPHOS 88 04/17/2017 1400   AST 21 04/17/2017 1400   ALT 9 (L) 04/17/2017 1400   BILITOT 0.6 04/17/2017 1400       RADIOGRAPHIC STUDIES: I have personally reviewed the radiological images as listed and agreed with the findings in the  report. No results found.   ASSESSMENT & PLAN:  Malignant neoplasm of thyroid gland ([place]) # Thyroid ca with local reurence s/p surgery [[facility]; EBRT [2015; no RAI].  Slightly up at 40.5 in August 2017.US- neck 58m nodule/ not concerning. TSH-5.4; Today will increase synthroid to 75 mcg/day [new script for 263m given].   Thyroglobulin levels- 27 [N]; slight increase in thyroglobulin antibodies.   # IDA/CKD- Stable. Hb today 11.7 today; recommend to continue PO iron pills BID.   # Horsness of voice:sec to RT.  s/p evaluation with Dr.Bennett.   # MGUS- IgGKappa- 0.4gm in August 2017. Will again repeat in 4 months.   # follow up 4 months/labs- 1 week prior/ possible venofer.    Orders Placed This Encounter  Procedures  . TgAb+Thyroglobulin IMA or RIA    Standing Status:   Future    Standing Expiration Date:   05/29/2018  . Thyroid Panel With TSH    Standing Status:   Future    Standing Expiration Date:   05/29/2018  . CBC with Differential/Platelet    Please do manual diff- and call if abnormal cells seen    Standing Status:   Future    Standing Expiration Date:   05/29/2018  . Iron and TIBC    Standing Status:   Future    Standing Expiration Date:   05/29/2018  . Kappa/lambda light chains    Standing Status:   Future    Standing Expiration Date:   05/29/2018  . Comprehensive metabolic panel    Standing Status:   Future    Standing Expiration Date:   05/29/2018  . Multiple Myeloma Panel (SPEP&IFE w/QIG)    Standing Status:   Future    Standing Expiration Date:   05/29/2018  . Ferritin    Standing Status:   Future    Standing Expiration Date:   05/29/2018   All questions were answered. The patient knows to call the clinic with any  problems, questions or concerns.   Faythe Casa, NP    Cammie Sickle, MD 04/24/2017 4:35 PM

## 2017-04-24 NOTE — Progress Notes (Signed)
Patient here today for follow up.  Patient states that she has new onset of soreness in her throat that started approx 2-3 weeks ago

## 2017-04-24 NOTE — Assessment & Plan Note (Addendum)
#   Thyroid ca with local reurence s/p surgery [[facility]; EBRT [2015; no RAI].  Slightly up at 40.5 in August 2017.US- neck 52mm nodule/ not concerning. TSH-5.4; Today will increase synthroid to 75 mcg/day [new script for 33mcg given].   Thyroglobulin levels- 27 [N]; slight increase in thyroglobulin antibodies.   # IDA/CKD- Stable. Hb today 11.7 today; recommend to continue PO iron pills BID.   # Horsness of voice:sec to RT.  s/p evaluation with Dr.Bennett.   # MGUS- IgGKappa- 0.4gm in August 2017. Will again repeat in 4 months.   # follow up 4 months/labs- 1 week prior/ possible venofer.

## 2017-05-31 ENCOUNTER — Other Ambulatory Visit: Payer: Self-pay | Admitting: Internal Medicine

## 2017-05-31 DIAGNOSIS — R600 Localized edema: Secondary | ICD-10-CM

## 2017-06-02 ENCOUNTER — Ambulatory Visit
Admission: RE | Admit: 2017-06-02 | Discharge: 2017-06-02 | Disposition: A | Discharge status: Home / Self Care | Payer: Medicare Other | Source: Ambulatory Visit | Attending: Internal Medicine | Admitting: Internal Medicine

## 2017-06-02 DIAGNOSIS — R600 Localized edema: Secondary | ICD-10-CM

## 2017-08-21 ENCOUNTER — Inpatient Hospital Stay: Payer: Medicare Other | Attending: Internal Medicine

## 2017-08-21 DIAGNOSIS — C73 Malignant neoplasm of thyroid gland: Secondary | ICD-10-CM

## 2017-08-21 DIAGNOSIS — D509 Iron deficiency anemia, unspecified: Secondary | ICD-10-CM | POA: Insufficient documentation

## 2017-08-21 LAB — IRON AND TIBC
Iron: 51 ug/dL (ref 28–170)
SATURATION RATIOS: 16 % (ref 10.4–31.8)
TIBC: 318 ug/dL (ref 250–450)
UIBC: 267 ug/dL

## 2017-08-21 LAB — CBC WITH DIFFERENTIAL/PLATELET
BASOS ABS: 0.1 10*3/uL (ref 0–0.1)
Basophils Relative: 1 %
EOS PCT: 3 %
Eosinophils Absolute: 0.1 10*3/uL (ref 0–0.7)
HEMATOCRIT: 32.8 % — AB (ref 35.0–47.0)
Hemoglobin: 10.8 g/dL — ABNORMAL LOW (ref 12.0–16.0)
LYMPHS PCT: 13 %
Lymphs Abs: 0.6 10*3/uL — ABNORMAL LOW (ref 1.0–3.6)
MCH: 30.6 pg (ref 26.0–34.0)
MCHC: 33 g/dL (ref 32.0–36.0)
MCV: 92.7 fL (ref 80.0–100.0)
MONO ABS: 0.4 10*3/uL (ref 0.2–0.9)
MONOS PCT: 7 %
NEUTROS ABS: 3.7 10*3/uL (ref 1.4–6.5)
Neutrophils Relative %: 76 %
PLATELETS: 341 10*3/uL (ref 150–440)
RBC: 3.54 MIL/uL — ABNORMAL LOW (ref 3.80–5.20)
RDW: 15.3 % — AB (ref 11.5–14.5)
WBC: 4.9 10*3/uL (ref 3.6–11.0)

## 2017-08-21 LAB — COMPREHENSIVE METABOLIC PANEL
ALBUMIN: 4.2 g/dL (ref 3.5–5.0)
ALK PHOS: 90 U/L (ref 38–126)
ALT: 8 U/L — AB (ref 14–54)
AST: 23 U/L (ref 15–41)
Anion gap: 10 (ref 5–15)
BILIRUBIN TOTAL: 0.7 mg/dL (ref 0.3–1.2)
BUN: 15 mg/dL (ref 6–20)
CALCIUM: 9.4 mg/dL (ref 8.9–10.3)
CO2: 25 mmol/L (ref 22–32)
CREATININE: 1.09 mg/dL — AB (ref 0.44–1.00)
Chloride: 103 mmol/L (ref 101–111)
GFR calc Af Amer: 56 mL/min — ABNORMAL LOW (ref >60)
GFR, EST NON AFRICAN AMERICAN: 48 mL/min — AB (ref >60)
GLUCOSE: 103 mg/dL — AB (ref 65–99)
Potassium: 3.8 mmol/L (ref 3.5–5.1)
Sodium: 138 mmol/L (ref 135–145)
TOTAL PROTEIN: 8 g/dL (ref 6.5–8.1)

## 2017-08-21 LAB — FERRITIN: FERRITIN: 238 ng/mL (ref 11–307)

## 2017-08-22 LAB — KAPPA/LAMBDA LIGHT CHAINS
Kappa free light chain: 21.2 mg/L — ABNORMAL HIGH (ref 3.3–19.4)
Kappa, lambda light chain ratio: 0.86 (ref 0.26–1.65)
Lambda free light chains: 24.6 mg/L (ref 5.7–26.3)

## 2017-08-22 LAB — MULTIPLE MYELOMA PANEL, SERUM
ALBUMIN/GLOB SERPL: 1 (ref 0.7–1.7)
Albumin SerPl Elph-Mcnc: 3.5 g/dL (ref 2.9–4.4)
Alpha 1: 0.2 g/dL (ref 0.0–0.4)
Alpha2 Glob SerPl Elph-Mcnc: 1 g/dL (ref 0.4–1.0)
B-GLOBULIN SERPL ELPH-MCNC: 1.1 g/dL (ref 0.7–1.3)
GAMMA GLOB SERPL ELPH-MCNC: 1.3 g/dL (ref 0.4–1.8)
GLOBULIN, TOTAL: 3.6 g/dL (ref 2.2–3.9)
IgA: 230 mg/dL (ref 64–422)
IgG (Immunoglobin G), Serum: 1182 mg/dL (ref 700–1600)
IgM (Immunoglobulin M), Srm: 149 mg/dL (ref 26–217)
M PROTEIN SERPL ELPH-MCNC: 0.6 g/dL — AB
Total Protein ELP: 7.1 g/dL (ref 6.0–8.5)

## 2017-08-22 LAB — THYROGLOBULIN BY IMA: THYROGLOBULIN BY: 14.3 ng/mL (ref 1.5–38.5)

## 2017-08-22 LAB — TGAB+THYROGLOBULIN IMA OR RIA: Thyroglobulin Antibody: <1 IU/mL (ref 0.0–0.9)

## 2017-08-22 LAB — THYROID PANEL WITH TSH
Free Thyroxine Index: 1.7 (ref 1.2–4.9)
T3 UPTAKE RATIO: 23 % — AB (ref 24–39)
T4 TOTAL: 7.2 ug/dL (ref 4.5–12.0)
TSH: 0.601 u[IU]/mL (ref 0.450–4.500)

## 2017-08-25 ENCOUNTER — Inpatient Hospital Stay: Payer: Medicare Other | Attending: Internal Medicine | Admitting: Internal Medicine

## 2017-08-25 ENCOUNTER — Inpatient Hospital Stay: Payer: Medicare Other

## 2017-08-25 VITALS — BP 129/85 | HR 69 | Temp 97.4°F | Resp 16 | Wt 206.6 lb

## 2017-08-25 DIAGNOSIS — J45909 Unspecified asthma, uncomplicated: Secondary | ICD-10-CM

## 2017-08-25 DIAGNOSIS — K219 Gastro-esophageal reflux disease without esophagitis: Secondary | ICD-10-CM | POA: Diagnosis not present

## 2017-08-25 DIAGNOSIS — Z923 Personal history of irradiation: Secondary | ICD-10-CM | POA: Insufficient documentation

## 2017-08-25 DIAGNOSIS — D509 Iron deficiency anemia, unspecified: Secondary | ICD-10-CM | POA: Diagnosis not present

## 2017-08-25 DIAGNOSIS — Z79899 Other long term (current) drug therapy: Secondary | ICD-10-CM | POA: Diagnosis not present

## 2017-08-25 DIAGNOSIS — Z808 Family history of malignant neoplasm of other organs or systems: Secondary | ICD-10-CM | POA: Insufficient documentation

## 2017-08-25 DIAGNOSIS — R490 Dysphonia: Secondary | ICD-10-CM | POA: Diagnosis not present

## 2017-08-25 DIAGNOSIS — Z8051 Family history of malignant neoplasm of kidney: Secondary | ICD-10-CM | POA: Diagnosis not present

## 2017-08-25 DIAGNOSIS — Z85850 Personal history of malignant neoplasm of thyroid: Secondary | ICD-10-CM

## 2017-08-25 DIAGNOSIS — E785 Hyperlipidemia, unspecified: Secondary | ICD-10-CM | POA: Insufficient documentation

## 2017-08-25 DIAGNOSIS — Z801 Family history of malignant neoplasm of trachea, bronchus and lung: Secondary | ICD-10-CM | POA: Diagnosis not present

## 2017-08-25 DIAGNOSIS — C73 Malignant neoplasm of thyroid gland: Secondary | ICD-10-CM

## 2017-08-25 DIAGNOSIS — D472 Monoclonal gammopathy: Secondary | ICD-10-CM | POA: Diagnosis not present

## 2017-08-25 DIAGNOSIS — I10 Essential (primary) hypertension: Secondary | ICD-10-CM | POA: Diagnosis not present

## 2017-08-25 DIAGNOSIS — R5383 Other fatigue: Secondary | ICD-10-CM

## 2017-08-25 DIAGNOSIS — D508 Other iron deficiency anemias: Secondary | ICD-10-CM

## 2017-08-25 MED ORDER — SODIUM CHLORIDE 0.9 % IV SOLN
Freq: Once | INTRAVENOUS | Status: AC
  Filled 2017-08-25: qty 1000

## 2017-08-25 MED ORDER — IRON SUCROSE 20 MG/ML IV SOLN
200.0000 mg | Freq: Once | INTRAVENOUS | Status: AC
  Filled 2017-08-25: qty 10

## 2017-08-25 NOTE — Assessment & Plan Note (Addendum)
#  Thyroid ca with local reurence s/p surgery [[facility]; EBRT [2015; no RAI].  Thyroglobulin/tumor marker normal. August 2017.US- neck 31m nodule/ not concerning. TSH 0.6 ; Continue synthroid to 75 mcg/day.   # IDA/CKD- Stable. Hb today 10.8 today; recommend to continue PO iron pills BID. Given the ongoing extreme fatigue- Proceed with IV venofer today.   # MGUS- IgGKappa- 0.6 gm in October 29th 2018. Normal Lambda light chain. Discussed my concern of this causing her anemia is really low. However if the anemia does not improve with iron infusion/gets worse- I think it's reasonable to get a bone marrow biopsy. However we'll hold off a bone marrow biopsy at this time.  # Fatigue- ? OSA vs other- recommend follow up with PCP.   # follow up 3 months/labs- cbc/bmp-venofer.

## 2017-08-25 NOTE — Progress Notes (Unsigned)
Numerous IV attempts made x 6 and unable to obtain IV access.  Encouraged patient to drinks lots of fluids before returning next week.  Patient verbalized understanding.

## 2017-08-25 NOTE — Progress Notes (Signed)
[place] OFFICE PROGRESS NOTE  Patient Care Team: Adin Hector, MD as PCP - General (Internal Medicine)  Cancer Staging No matching staging information was found for the patient.   Oncology History   1999- right thyroid lobectomy by Dr. Orene Desanctis with unusual poorly differentiated cancer, no additional treatment  2015- recurrent mass in thyroid bed. PET-CT showed FDG avid lesion in thyroid bed + nasopharyx.  2015- Dr. 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 Margins not noted [facility], [place], #[id number]).   01/05/2014- Nasopharyngeal biopsy negative for malignancy  04/25/14- completed postoperative XRT completing 65.25 CGy in 29 fractions  05/2014 + 07/2014- PET avidity at the left nasopharynx and left oropharyngeal soft tissue as well as a hypermetabolic 1.5 cm mass in the right thyroid bed. Dr Johney Frame excised the paratracheal mass on 09/02/2014. Inspect of the nasopharynx at the time of surgery revealed no obvious abnormality and no biopsy was completed.   09/2014- NP avid area biopsied and negative for carcinoma. SHe underwent a thyroid FNA and that was negative for cancer.  # Chronic Anemia- IDA [ EGD- hiatal hernia; Jan 2017- Dr.Rein] AUG 2017- IV Venofer  # AUG 2017- Thyroglobulin 40/Sep 2017 US neck- NED  # Hoarseness of voice- sec to RT [Dr.Bennett; Dec 2017]  # AUG 2017- IgG kappa 0.4gm//N-kappa-Lamda light chains       Malignant neoplasm of thyroid gland ([place])   09/01/2014 Initial Diagnosis    Malignant neoplasm of thyroid gland ([place])        INTERVAL HISTORY:  Sara Fields 76 y.o.  female pleasant patient above history of thyroid cancer; and diagnosis of iron deficiency anemia unclear etiology is here for follow-up.   Patient continues to complain of significant fatigue. She denies  any blood in stools or black stools. She continues to be an by mouth iron. Denies any constipation. She denies any new lumps or bumps.  No unusual chest pain or shortness of breath or cough. She states to have a good night sleep. Denies any snoring.   REVIEW OF SYSTEMS:  A complete 10 point review of system is done which is negative except mentioned above/history of present illness.   PAST MEDICAL HISTORY :  Past Medical History:  Diagnosis Date  . Anemia   . Asthma   . GERD (gastroesophageal reflux disease)   . Hyperlipidemia   . Hypertension   . Thyroid cancer ([place]) 1999 and 2015   Partial thyroidectomy with rad tx's.     PAST SURGICAL HISTORY :   Past Surgical History:  Procedure Laterality Date  . ABDOMINAL HYSTERECTOMY  1973   partial  . ESOPHAGOGASTRODUODENOSCOPY N/A 02/05/2017   Procedure: ESOPHAGOGASTRODUODENOSCOPY (EGD);  Surgeon: Wilford Corner, MD;  [facility] ENDOSCOPY;  Service: Endoscopy;  Laterality: N/A;  . ESOPHAGOGASTRODUODENOSCOPY (EGD) WITH PROPOFOL N/A 11/16/2015   Procedure: ESOPHAGOGASTRODUODENOSCOPY (EGD) WITH PROPOFOL;  Surgeon: Josefine Class, MD;  Location: [facility] ENDOSCOPY;  Service: Endoscopy;  Laterality: N/A;  . THYROID SURGERY  1999 and 2015   Partial Thyroidectomy    FAMILY HISTORY :   Family History  Problem Relation Age of Onset  . Lung cancer Father   . Hypertension Father   . Bone cancer Sister   . Hypertension Sister   . Lung cancer Brother   . Hypertension Brother   . Kidney cancer Sister   .  Hypertension Sister   . Lung cancer Sister   . Hypertension Sister   . Breast cancer Neg Hx     SOCIAL HISTORY:   Social History  Substance Use Topics  . Smoking status: Never Smoker  . Smokeless tobacco: Never Used  . Alcohol use No    ALLERGIES:  has No Known Allergies.  MEDICATIONS:  Current Outpatient Prescriptions  Medication Sig Dispense Refill  . albuterol (PROVENTIL HFA;VENTOLIN HFA) 108 (90 Base) MCG/ACT inhaler  Inhale into the lungs every 6 (six) hours as needed for wheezing or shortness of breath.    . allopurinol (ZYLOPRIM) 100 MG tablet Take 100 mg by mouth daily.    Marland Kitchen amLODipine (NORVASC) 5 MG tablet Take 1 tablet by mouth.    Marland Kitchen azelastine (ASTELIN) 0.1 % nasal spray Place 1 spray into both nostrils daily as needed for rhinitis or allergies.     . budesonide-formoterol (SYMBICORT) 160-4.5 MCG/ACT inhaler Inhale 2 puffs into the lungs 2 (two) times daily.    . bumetanide (BUMEX) 1 MG tablet Take 1 mg by mouth daily.    . calcium carbonate (CALCIUM 600) 600 MG TABS tablet Take 600 mg by mouth 2 (two) times daily with a meal.    . levothyroxine (SYNTHROID) 25 MCG tablet Take one pill along with 50 mcg pill [total 75 mcg] prior to breakfast 90 tablet 3  . LORazepam (ATIVAN) 0.5 MG tablet     . meclizine (ANTIVERT) 12.5 MG tablet Take by mouth.    . montelukast (SINGULAIR) 10 MG tablet Take 10 mg by mouth at bedtime.    . pantoprazole (PROTONIX) 40 MG tablet Take 1 tablet (40 mg total) by mouth 2 (two) times daily before a meal. 60 tablet 1  . potassium chloride SA (KLOR-CON M20) 20 MEQ tablet Take 20 mEq by mouth daily.     . propranolol (INDERAL) 20 MG tablet Take 20 mg by mouth 3 (three) times daily.    . rosuvastatin (CRESTOR) 10 MG tablet Take 10 mg by mouth daily.     . traMADol (ULTRAM) 50 MG tablet Take 50 mg by mouth every 6 (six) hours as needed.    . Turmeric 500 MG TABS Take 1 tablet by mouth daily.     No current facility-administered medications for this visit.    Facility-Administered Medications Ordered in Other Visits  Medication Dose Route Frequency Provider Last Rate Last Dose  . 0.9 %  sodium chloride infusion   Intravenous Once Charlaine Dalton R, MD      . iron sucrose (VENOFER) 200 mg IVPB  200 mg Intravenous Once Cammie Sickle, MD        PHYSICAL EXAMINATION: ECOG PERFORMANCE STATUS:   BP 129/85 (BP Location: Left Arm)   Pulse 69   Temp (!) 97.4 F (36.3 C)  (Tympanic)   Resp 16   Wt 206 lb 9.6 oz (93.7 kg)   BMI 33.35 kg/m   Filed Weights   08/25/17 1354  Weight: 206 lb 9.6 oz (93.7 kg)    GENERAL: Well-nourished well-developed; Alert, no distress and comfortable.   EYES: no pallor or icterus OROPHARYNX: no thrush or ulceration; good dentition  NECK: supple, no masses felt; Scar from thyroid surgery noted. LYMPH:  no palpable lymphadenopathy in the cervical, axillary or inguinal regions LUNGS: clear to auscultation and  No wheeze or crackles HEART/CVS: regular rate & rhythm and no murmurs; No lower extremity edema ABDOMEN:abdomen soft, non-tender and normal bowel sounds Musculoskeletal:no cyanosis of digits  and no clubbing  PSYCH: alert & oriented x 3 with fluent speech NEURO: no focal motor/sensory deficits SKIN:  no rashes or significant lesions  LABORATORY DATA:  I have reviewed the data as listed    Component Value Date/Time   NA 138 08/21/2017 1400   K 3.8 08/21/2017 1400   CL 103 08/21/2017 1400   CO2 25 08/21/2017 1400   GLUCOSE 103 (H) 08/21/2017 1400   BUN 15 08/21/2017 1400   CREATININE 1.09 (H) 08/21/2017 1400   CALCIUM 9.4 08/21/2017 1400   PROT 8.0 08/21/2017 1400   ALBUMIN 4.2 08/21/2017 1400   AST 23 08/21/2017 1400   ALT 8 (L) 08/21/2017 1400   ALKPHOS 90 08/21/2017 1400   BILITOT 0.7 08/21/2017 1400   GFRNONAA 48 (L) 08/21/2017 1400   GFRAA 56 (L) 08/21/2017 1400    No results found for: SPEP, UPEP  Lab Results  Component Value Date   WBC 4.9 08/21/2017   NEUTROABS 3.7 08/21/2017   HGB 10.8 (L) 08/21/2017   HCT 32.8 (L) 08/21/2017   MCV 92.7 08/21/2017   PLT 341 08/21/2017      Chemistry      Component Value Date/Time   NA 138 08/21/2017 1400   K 3.8 08/21/2017 1400   CL 103 08/21/2017 1400   CO2 25 08/21/2017 1400   BUN 15 08/21/2017 1400   CREATININE 1.09 (H) 08/21/2017 1400      Component Value Date/Time   CALCIUM 9.4 08/21/2017 1400   ALKPHOS 90 08/21/2017 1400   AST 23  08/21/2017 1400   ALT 8 (L) 08/21/2017 1400   BILITOT 0.7 08/21/2017 1400       RADIOGRAPHIC STUDIES: I have personally reviewed the radiological images as listed and agreed with the findings in the report. No results found.   ASSESSMENT & PLAN:  Malignant neoplasm of thyroid gland ([place]) # Thyroid ca with local reurence s/p surgery [[facility]; EBRT [2015; no RAI].  Thyroglobulin/tumor marker normal. August 2017.US- neck 24m nodule/ not concerning. TSH 0.6 ; Continue synthroid to 75 mcg/day.   # IDA/CKD- Stable. Hb today 10.8 today; recommend to continue PO iron pills BID. Given the ongoing extreme fatigue- Proceed with IV venofer today.   # MGUS- IgGKappa- 0.6 gm in October 29th 2018. Normal Lambda light chain. Discussed my concern of this causing her anemia is really low. However if the anemia does not improve with iron infusion/gets worse- I think it's reasonable to get a bone marrow biopsy. However we'll hold off a bone marrow biopsy at this time.  # Fatigue- ? OSA vs other- recommend follow up with PCP.   # follow up 3 months/labs- cbc/bmp-venofer.   No orders of the defined types were placed in this encounter.  All questions were answered. The patient knows to call the clinic with any problems, questions or concerns.     GCammie Sickle MD 08/25/2017 3:02 PM

## 2017-08-28 ENCOUNTER — Other Ambulatory Visit: Payer: Self-pay | Admitting: Internal Medicine

## 2017-08-28 ENCOUNTER — Inpatient Hospital Stay: Payer: Medicare Other

## 2017-08-28 VITALS — BP 116/69 | HR 70 | Resp 18

## 2017-08-28 DIAGNOSIS — Z85850 Personal history of malignant neoplasm of thyroid: Secondary | ICD-10-CM | POA: Diagnosis not present

## 2017-08-28 DIAGNOSIS — D508 Other iron deficiency anemias: Secondary | ICD-10-CM

## 2017-08-28 MED ORDER — IRON SUCROSE 20 MG/ML IV SOLN
200.0000 mg | Freq: Once | INTRAVENOUS | Status: AC
  Administered 2017-08-28: 200 mg via INTRAVENOUS
  Filled 2017-08-28: qty 10

## 2017-08-28 MED ORDER — SODIUM CHLORIDE 0.9 % IV SOLN
Freq: Once | INTRAVENOUS | Status: AC
  Administered 2017-08-28: 14:00:00 via INTRAVENOUS
  Filled 2017-08-28: qty 1000

## 2017-09-25 ENCOUNTER — Other Ambulatory Visit: Payer: Self-pay | Admitting: Internal Medicine

## 2017-09-25 DIAGNOSIS — Z1231 Encounter for screening mammogram for malignant neoplasm of breast: Secondary | ICD-10-CM

## 2017-10-27 ENCOUNTER — Ambulatory Visit
Admission: RE | Admit: 2017-10-27 | Discharge: 2017-10-27 | Disposition: A | Discharge status: Home / Self Care | Payer: Medicare Other | Source: Ambulatory Visit | Attending: Internal Medicine | Admitting: Internal Medicine

## 2017-10-27 DIAGNOSIS — Z1231 Encounter for screening mammogram for malignant neoplasm of breast: Secondary | ICD-10-CM

## 2017-11-23 ENCOUNTER — Other Ambulatory Visit: Payer: Self-pay

## 2017-11-23 DIAGNOSIS — C73 Malignant neoplasm of thyroid gland: Secondary | ICD-10-CM

## 2017-11-24 ENCOUNTER — Inpatient Hospital Stay: Payer: Medicare Other

## 2017-11-24 ENCOUNTER — Encounter: Payer: Self-pay | Admitting: Internal Medicine

## 2017-11-24 ENCOUNTER — Inpatient Hospital Stay (HOSPITAL_BASED_OUTPATIENT_CLINIC_OR_DEPARTMENT_OTHER): Payer: Medicare Other | Admitting: Internal Medicine

## 2017-11-24 ENCOUNTER — Inpatient Hospital Stay: Payer: Medicare Other | Attending: Internal Medicine

## 2017-11-24 VITALS — BP 125/79 | HR 69 | Temp 97.8°F | Wt 206.4 lb

## 2017-11-24 VITALS — BP 117/73 | HR 54

## 2017-11-24 DIAGNOSIS — D508 Other iron deficiency anemias: Secondary | ICD-10-CM

## 2017-11-24 DIAGNOSIS — N189 Chronic kidney disease, unspecified: Secondary | ICD-10-CM

## 2017-11-24 DIAGNOSIS — D509 Iron deficiency anemia, unspecified: Secondary | ICD-10-CM | POA: Diagnosis not present

## 2017-11-24 DIAGNOSIS — R5383 Other fatigue: Secondary | ICD-10-CM

## 2017-11-24 DIAGNOSIS — C73 Malignant neoplasm of thyroid gland: Secondary | ICD-10-CM

## 2017-11-24 DIAGNOSIS — D472 Monoclonal gammopathy: Secondary | ICD-10-CM

## 2017-11-24 DIAGNOSIS — M2560 Stiffness of unspecified joint, not elsewhere classified: Secondary | ICD-10-CM | POA: Insufficient documentation

## 2017-11-24 LAB — CBC WITH DIFFERENTIAL/PLATELET
Basophils Absolute: 0 10*3/uL (ref 0–0.1)
Basophils Relative: 1 %
Eosinophils Absolute: 0.2 10*3/uL (ref 0–0.7)
Eosinophils Relative: 3 %
HEMATOCRIT: 30.9 % — AB (ref 35.0–47.0)
HEMOGLOBIN: 10.1 g/dL — AB (ref 12.0–16.0)
LYMPHS ABS: 0.7 10*3/uL — AB (ref 1.0–3.6)
Lymphocytes Relative: 14 %
MCH: 31 pg (ref 26.0–34.0)
MCHC: 32.7 g/dL (ref 32.0–36.0)
MCV: 94.7 fL (ref 80.0–100.0)
MONOS PCT: 8 %
Monocytes Absolute: 0.4 10*3/uL (ref 0.2–0.9)
NEUTROS PCT: 74 %
Neutro Abs: 3.9 10*3/uL (ref 1.4–6.5)
Platelets: 343 10*3/uL (ref 150–440)
RBC: 3.27 MIL/uL — AB (ref 3.80–5.20)
RDW: 15.8 % — ABNORMAL HIGH (ref 11.5–14.5)
WBC: 5.3 10*3/uL (ref 3.6–11.0)

## 2017-11-24 LAB — BASIC METABOLIC PANEL
Anion gap: 12 (ref 5–15)
BUN: 29 mg/dL — AB (ref 6–20)
CHLORIDE: 98 mmol/L — AB (ref 101–111)
CO2: 25 mmol/L (ref 22–32)
CREATININE: 1.49 mg/dL — AB (ref 0.44–1.00)
Calcium: 8.8 mg/dL — ABNORMAL LOW (ref 8.9–10.3)
GFR calc Af Amer: 38 mL/min — ABNORMAL LOW (ref >60)
GFR calc non Af Amer: 33 mL/min — ABNORMAL LOW (ref >60)
Glucose, Bld: 103 mg/dL — ABNORMAL HIGH (ref 65–99)
POTASSIUM: 3.4 mmol/L — AB (ref 3.5–5.1)
SODIUM: 135 mmol/L (ref 135–145)

## 2017-11-24 MED ORDER — IRON SUCROSE 20 MG/ML IV SOLN
200.0000 mg | Freq: Once | INTRAVENOUS | Status: AC
  Administered 2017-11-24: 200 mg via INTRAVENOUS
  Filled 2017-11-24: qty 10

## 2017-11-24 MED ORDER — SODIUM CHLORIDE 0.9 % IV SOLN
Freq: Once | INTRAVENOUS | Status: AC
  Administered 2017-11-24: 15:00:00 via INTRAVENOUS
  Filled 2017-11-24: qty 1000

## 2017-11-24 NOTE — Assessment & Plan Note (Addendum)
#   Thyroid ca with local reurence s/p surgery [[facility]; EBRT [2015; no RAI].  Thyroglobulin/tumor marker normal. August 2017.US- neck 25mm nodule/ not concerning. TSH 0.6 ; Continue synthroid to 75 mcg/day.   # IDA/CKD- Stable. Hb today 10.8 today; recommend to continue PO iron pills BID. Hold off iron IV today.   # MGUS- IgGKappa- 0.6 gm in October 29th 2018.  Unlikely related to her anemia.  Monitor for now.  Will recheck again at next visit  # CKD- creat 1.5; check urinalysis.  Defer to PCP regarding further workup of chronic kidney disease.  # Neck stiffness-likely secondary to previous thyroid surgery/radiation.  Recommend physical therapy evaluation.  # follow up 4 months/labs- cbc/bmp-venofer; mm panel; thyroid TM; profile.   Cc; Dr.Klein.

## 2017-11-24 NOTE — Progress Notes (Signed)
[name] OFFICE PROGRESS NOTE  Patient Care Team: Adin Hector, MD as PCP - General (Internal Medicine)  Cancer Staging No matching staging information was found for the patient.   Oncology History   1999- right thyroid lobectomy by Dr. Orene Desanctis with unusual poorly differentiated cancer, no additional treatment  2015- recurrent mass in thyroid bed. PET-CT showed FDG avid lesion in thyroid bed + nasopharyx.  2015- Dr. 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 Margins not noted [facility], [place], #[id number]).   01/05/2014- Nasopharyngeal biopsy negative for malignancy  04/25/14- completed postoperative XRT completing 65.25 CGy in 29 fractions  05/2014 + 07/2014- PET avidity at the left nasopharynx and left oropharyngeal soft tissue as well as a hypermetabolic 1.5 cm mass in the right thyroid bed. Dr Johney Frame excised the paratracheal mass on 09/02/2014. Inspect of the nasopharynx at the time of surgery revealed no obvious abnormality and no biopsy was completed.   09/2014- NP avid area biopsied and negative for carcinoma. SHe underwent a thyroid FNA and that was negative for cancer.  # Chronic Anemia- IDA [ EGD- hiatal hernia; Jan 2017- Dr.Rein] AUG 2017- IV Venofer  # AUG 2017- Thyroglobulin 40/Sep 2017 US neck- NED  # Hoarseness of voice- sec to RT [Dr.Bennett; Dec 2017]  # AUG 2017- IgG kappa 0.4gm//N-kappa-Lamda light chains       Malignant neoplasm of thyroid gland ([place])   09/01/2014 Initial Diagnosis    Malignant neoplasm of thyroid gland ([place])        INTERVAL HISTORY:  Sara Fields 77 y.o.  female pleasant patient above history of thyroid cancer; and diagnosis of iron deficiency anemia unclear etiology is here for follow-up.   Patient complains of neck stiffness for the last many months.   She denies any new lumps or bumps.   She complains of mild fatigue.  she denies any blood in stools or black stools. She continues to be an by mouth iron. Denies any constipation. She denies any new lumps or bumps.  No unusual chest pain or shortness of breath or cough. She states to have a good night sleep. Denies any snoring.   REVIEW OF SYSTEMS:  A complete 10 point review of system is done which is negative except mentioned above/history of present illness.   PAST MEDICAL HISTORY :  Past Medical History:  Diagnosis Date  . Anemia   . Asthma   . GERD (gastroesophageal reflux disease)   . Hyperlipidemia   . Hypertension   . Thyroid cancer ([place]) 1999 and 2015   Partial thyroidectomy with rad tx's.     PAST SURGICAL HISTORY :   Past Surgical History:  Procedure Laterality Date  . ABDOMINAL HYSTERECTOMY  1973   partial  . ESOPHAGOGASTRODUODENOSCOPY N/A 02/05/2017   Procedure: ESOPHAGOGASTRODUODENOSCOPY (EGD);  Surgeon: Wilford Corner, MD;  Location: [facility] ENDOSCOPY;  Service: Endoscopy;  Laterality: N/A;  . ESOPHAGOGASTRODUODENOSCOPY (EGD) WITH PROPOFOL N/A 11/16/2015   Procedure: ESOPHAGOGASTRODUODENOSCOPY (EGD) WITH PROPOFOL;  Surgeon: Josefine Class, MD;  Location: [facility] ENDOSCOPY;  Service: Endoscopy;  Laterality: N/A;  . THYROID SURGERY  1999 and 2015   Partial Thyroidectomy    FAMILY HISTORY :   Family History  Problem Relation Age of Onset  . Lung cancer Father   . Hypertension Father   . Bone cancer Sister   . Hypertension Sister   .  Lung cancer Brother   . Hypertension Brother   . Kidney cancer Sister   . Hypertension Sister   . Lung cancer Sister   . Hypertension Sister   . Breast cancer Neg Hx     SOCIAL HISTORY:   Social History   Tobacco Use  . Smoking status: Never Smoker  . Smokeless tobacco: Never Used  Substance Use Topics  . Alcohol use: No  . Drug use: No    ALLERGIES:  has No Known Allergies.  MEDICATIONS:  Current Outpatient  Medications  Medication Sig Dispense Refill  . albuterol (PROVENTIL HFA;VENTOLIN HFA) 108 (90 Base) MCG/ACT inhaler Inhale into the lungs every 6 (six) hours as needed for wheezing or shortness of breath.    Marland Kitchen amLODipine (NORVASC) 5 MG tablet Take 1 tablet by mouth.    . budesonide-formoterol (SYMBICORT) 160-4.5 MCG/ACT inhaler Inhale 2 puffs into the lungs 2 (two) times daily.    . bumetanide (BUMEX) 1 MG tablet Take 1 mg by mouth daily.    . calcium carbonate (CALCIUM 600) 600 MG TABS tablet Take 600 mg by mouth 2 (two) times daily with a meal.    . levothyroxine (SYNTHROID) 25 MCG tablet Take one pill along with 50 mcg pill [total 75 mcg] prior to breakfast 90 tablet 3  . LORazepam (ATIVAN) 0.5 MG tablet     . meclizine (ANTIVERT) 12.5 MG tablet Take by mouth.    . pantoprazole (PROTONIX) 40 MG tablet Take 1 tablet (40 mg total) by mouth 2 (two) times daily before a meal. 60 tablet 1  . potassium chloride SA (KLOR-CON M20) 20 MEQ tablet Take 20 mEq by mouth daily.     . propranolol (INDERAL) 20 MG tablet Take 20 mg by mouth 3 (three) times daily.    . traMADol (ULTRAM) 50 MG tablet Take 50 mg by mouth every 6 (six) hours as needed.    . Turmeric 500 MG TABS Take 1 tablet by mouth daily.    Marland Kitchen allopurinol (ZYLOPRIM) 100 MG tablet Take 100 mg by mouth daily.    Marland Kitchen azelastine (ASTELIN) 0.1 % nasal spray Place 1 spray into both nostrils daily as needed for rhinitis or allergies.     . montelukast (SINGULAIR) 10 MG tablet Take 10 mg by mouth at bedtime.    . rosuvastatin (CRESTOR) 10 MG tablet Take 10 mg by mouth daily.      No current facility-administered medications for this visit.    Facility-Administered Medications Ordered in Other Visits  Medication Dose Route Frequency Provider Last Rate Last Dose  . 0.9 %  sodium chloride infusion   Intravenous Once Charlaine Dalton R, MD      . iron sucrose (VENOFER) 200 mg IVPB  200 mg Intravenous Once Cammie Sickle, MD        PHYSICAL  EXAMINATION: ECOG PERFORMANCE STATUS:   BP 125/79 (BP Location: Left Arm, Patient Position: Sitting)   Pulse 69   Temp 97.8 F (36.6 C) (Tympanic)   Wt 206 lb 6.4 oz (93.6 kg)   SpO2 100%   BMI 33.31 kg/m   Filed Weights   11/24/17 1412  Weight: 206 lb 6.4 oz (93.6 kg)    GENERAL: Well-nourished well-developed; Alert, no distress and comfortable.   EYES: no pallor or icterus OROPHARYNX: no thrush or ulceration; good dentition  NECK: supple, no masses felt; Scar from thyroid surgery noted. LYMPH:  no palpable lymphadenopathy in the cervical, axillary or inguinal regions LUNGS: clear to auscultation and  No wheeze or crackles HEART/CVS: regular rate & rhythm and no murmurs; No lower extremity edema ABDOMEN:abdomen soft, non-tender and normal bowel sounds Musculoskeletal:no cyanosis of digits and no clubbing  PSYCH: alert & oriented x 3 with fluent speech NEURO: no focal motor/sensory deficits SKIN:  no rashes or significant lesions  LABORATORY DATA:  I have reviewed the data as listed    Component Value Date/Time   NA 135 11/24/2017 1340   K 3.4 (L) 11/24/2017 1340   CL 98 (L) 11/24/2017 1340   CO2 25 11/24/2017 1340   GLUCOSE 103 (H) 11/24/2017 1340   BUN 29 (H) 11/24/2017 1340   CREATININE 1.49 (H) 11/24/2017 1340   CALCIUM 8.8 (L) 11/24/2017 1340   PROT 8.0 08/21/2017 1400   ALBUMIN 4.2 08/21/2017 1400   AST 23 08/21/2017 1400   ALT 8 (L) 08/21/2017 1400   ALKPHOS 90 08/21/2017 1400   BILITOT 0.7 08/21/2017 1400   GFRNONAA 33 (L) 11/24/2017 1340   GFRAA 38 (L) 11/24/2017 1340    No results found for: SPEP, UPEP  Lab Results  Component Value Date   WBC 5.3 11/24/2017   NEUTROABS 3.9 11/24/2017   HGB 10.1 (L) 11/24/2017   HCT 30.9 (L) 11/24/2017   MCV 94.7 11/24/2017   PLT 343 11/24/2017      Chemistry      Component Value Date/Time   NA 135 11/24/2017 1340   K 3.4 (L) 11/24/2017 1340   CL 98 (L) 11/24/2017 1340   CO2 25 11/24/2017 1340   BUN 29  (H) 11/24/2017 1340   CREATININE 1.49 (H) 11/24/2017 1340      Component Value Date/Time   CALCIUM 8.8 (L) 11/24/2017 1340   ALKPHOS 90 08/21/2017 1400   AST 23 08/21/2017 1400   ALT 8 (L) 08/21/2017 1400   BILITOT 0.7 08/21/2017 1400       RADIOGRAPHIC STUDIES: I have personally reviewed the radiological images as listed and agreed with the findings in the report. No results found.   ASSESSMENT & PLAN:  Malignant neoplasm of thyroid gland ([place]) # Thyroid ca with local reurence s/p surgery [[facility]; EBRT [2015; no RAI].  Thyroglobulin/tumor marker normal. August 2017.US- neck 3mm nodule/ not concerning. TSH 0.6 ; Continue synthroid to 75 mcg/day.   # IDA/CKD- Stable. Hb today 10.8 today; recommend to continue PO iron pills BID. Given the ongoing extreme fatigue- Proceed with IV venofer today.   # MGUS- IgGKappa- 0.6 gm in October 29th 2018.  Unlikely related to her anemia.  Monitor for now.  Will recheck again at next visit  # CKD- creat 1.5; check urinalysis.  Defer to PCP regarding further workup of chronic kidney disease.  # Neck stiffness-likely secondary to previous thyroid surgery/radiation.  Recommend physical therapy evaluation.  # follow up 4 months/labs- cbc/bmp-venofer.  Cc; Dr.Klein.    Orders Placed This Encounter  Procedures  . CBC with Differential/Platelet    Standing Status:   Future    Standing Expiration Date:   11/24/2018  . Basic metabolic panel    Standing Status:   Future    Standing Expiration Date:   11/24/2018  . Iron and TIBC    Standing Status:   Future    Standing Expiration Date:   11/24/2018  . Ferritin    Standing Status:   Future    Standing Expiration Date:   11/24/2018  . Ambulatory referral to Physical Therapy    Referral Priority:   Routine    Referral Type:  Physical Medicine    Referral Reason:   Specialty Services Required    Requested Specialty:   Physical Therapy    Number of Visits Requested:   1   All questions were answered.  The patient knows to call the clinic with any problems, questions or concerns.     Cammie Sickle, MD 11/26/2017 7:49 PM

## 2017-12-12 ENCOUNTER — Other Ambulatory Visit: Payer: Self-pay

## 2017-12-12 ENCOUNTER — Ambulatory Visit: Payer: Medicare Other | Attending: Internal Medicine

## 2017-12-12 DIAGNOSIS — M542 Cervicalgia: Secondary | ICD-10-CM | POA: Insufficient documentation

## 2017-12-12 DIAGNOSIS — M5412 Radiculopathy, cervical region: Secondary | ICD-10-CM | POA: Diagnosis present

## 2017-12-12 NOTE — Therapy (Signed)
[facility] [address], [place], [address] Phone: [phone]   Fax:  [phone]  Physical Therapy Evaluation  Patient Details  Name: Sara Fields MRN: [id number] Date of Birth: [birth year] Referring Provider: Charlaine Dalton, MD   Encounter Date: 12/12/2017  PT End of Session - 12/12/17 1301    Visit Number  1    Number of Visits  11    Date for PT Re-Evaluation  01/18/18    PT Start Time  1301    PT Stop Time  1407    PT Time Calculation (min)  66 min    Activity Tolerance  Patient tolerated treatment well    Behavior During Therapy  [facility] for tasks assessed/performed       Past Medical History:  Diagnosis Date  . Anemia   . Asthma   . GERD (gastroesophageal reflux disease)   . Hyperlipidemia   . Hypertension   . Thyroid cancer ([place]) 1999 and 2015   Partial thyroidectomy with rad tx's.     Past Surgical History:  Procedure Laterality Date  . ABDOMINAL HYSTERECTOMY  1973   partial  . ESOPHAGOGASTRODUODENOSCOPY N/A 02/05/2017   Procedure: ESOPHAGOGASTRODUODENOSCOPY (EGD);  Surgeon: Wilford Corner, MD;  Location: [facility] ENDOSCOPY;  Service: Endoscopy;  Laterality: N/A;  . ESOPHAGOGASTRODUODENOSCOPY (EGD) WITH PROPOFOL N/A 11/16/2015   Procedure: ESOPHAGOGASTRODUODENOSCOPY (EGD) WITH PROPOFOL;  Surgeon: Josefine Class, MD;  Location: [facility] ENDOSCOPY;  Service: Endoscopy;  Laterality: N/A;  . THYROID SURGERY  1999 and 2015   Partial Thyroidectomy    There were no vitals filed for this visit.   Subjective Assessment - 12/12/17 1309    Subjective  Pt states blood pressure is controlled with medication. Pt states that her thyroid CA is not there currently as far as she knows. Neck pain: 0/10 currently (pt sitting and resting), 10/10 at worst for the past 2 months (spasms only last for a split second). Spams feel like its pulsating.     Pertinent History  Neck pain. Pt states that she does  not know if her neck pain is from the radiation (last radiation was on July 2015) or from her back pain.  Neck pain began 4 months ago, not constant. Feels spams which comes and goes.  Pain is located bilateral lateral neck.  Pain began suddenly but pain progresively worsened.  Denies changes in vision. Pt states being iron deficient and might be feeling weak because of it. Both UE and LE feel equally weak. Usually gets iron infusions every 4-5 months as needed.  Denies loss of bowel or bladder function.  Gets regular check ups from her oncologist and family doctor.  Pt still has to do everything whether it hurts or not because she is her husband's caregiver. Her husband has demetia.     Diagnostic tests  Had an MRI for her neck a few years ago.     Patient Stated Goals  Wants the pain to go away.     Currently in Pain?  No/denies    Pain Score  0-No pain    Pain Location  Neck    Pain Orientation  Right;Left    Pain Descriptors / Indicators  Spasm quick spasms but sometimes 1 min or less    Pain Type  Chronic pain    Pain Onset  More than a month ago    Pain Frequency  Occasional    Aggravating Factors   Laying down but unable  to get her pillow just right. Other than that, she does not know what causes her spasm. Sleeps on an adjustable bed.     Pain Relieving Factors  supine with proper pillow placement behind her head.          [facility] PT Assessment - 12/12/17 1322      Assessment   Medical Diagnosis  Neck stiffness    Referring Provider  Charlaine Dalton, MD    Onset Date/Surgical Date  11/24/17 Date PT referral ordered. Pain began 4 months ago    Hand Dominance  Right    Prior Therapy  No known PT for current condition.       Precautions   Precaution Comments  CT Soft tissue of neck 2017: " Atherosclerosis. Fusiform left ICA aneurysm at the skillbase measuring 11 mm."  Pt also has hx of thyroid cancer.       Restrictions   Other Position/Activity Restrictions  No known weight  bearing restrictions      Balance Screen   Has the patient fallen in the past 6 months  No    Has the patient had a decrease in activity level because of a fear of falling?   No Pt careful    Is the patient reluctant to leave their home because of a fear of falling?   No      Home Environment   Additional Comments  Pt lives in a 1 story home with her husband with dementia (pt is his caregiver). 2 steps to enter with R rail.       Prior Function   Vocation  Retired    [employer]  PLOF: better able to turn her head as well as sleep with less neck pain.       Observation/Other Assessments   Observations  movement crease around C5/C6 area. Old cervical incision R anterior lateral neck with stiffness and firmness. Pt denies any new lumps or bumps.     Focus on Therapeutic Outcomes (FOTO)   48    Neck Disability Index   28%      Posture/Postural Control   Posture Comments  R lateral lean, decreased bilateral hip extension,  R cervical side bend, bilaterally protracted shoulders and neck, bilateral shoulder shrug, R LE forward       AROM   Cervical Flexion  full, no pain    Cervical Extension  limited with neck pain around C5/C6 area but pain is different     Cervical - Right Side Bend  limited with reproduction of R lateral neck pain. Eases quickly when returning to neutral    Cervical - Left Side Bend  limited    Cervical - Right Rotation  limited with reproduction of R lateral neck pain. Eases quickly when in the neutral position.     Cervical - Left Rotation  limited with reproduction of L lateral neck pain. Eases quickly when in the neutral postion.       Strength   Right Shoulder Flexion  4/5    Right Shoulder Internal Rotation  4/5    Right Shoulder External Rotation  4/5    Left Shoulder Flexion  4+/5    Left Shoulder Internal Rotation  4/5    Left Shoulder External Rotation  4/5    Right Elbow Flexion  4/5    Right Elbow Extension  4+/5    Left Elbow Flexion  4+/5     Left Elbow Extension  4+/5    Right Wrist  Extension  4+/5    Left Wrist Extension  4/5      Palpation   Palpation comment  Muscle tension bilateral cervical paraspinal, slight L rotation T1, muscle knots bilateral upper trap muscle with tenderness,              Objective measurements completed on examination: See above findings.   Diagnosis: Neck Stiffness.   Pt states bilateral lateral neck pain  Pt states that her thyroid cancer was removed 2015  Blood pressure, L arm sitting, mechanically taken: 132/71, HR 63    CT soft tissue neck 2017: Atherosclerosis. Fusiform left ICA aneurysm at the skullbase measuring 11 mm.  Manual therapy  STM R upper trap muscle tension in sitting STM bilateral cervical paraspinal muscles to decrease tension in sitting   Slight decrease in R lateral neck pain with R cervical rotaiton afterwards   Therapeutic exercise  Seated bilateral scap retract 10x5 seconds   reviewed and given as part of her HEP. Pt demonstrated and verbalized understanding.   scap retraction with R and L cervical rotation. Decreased ipsilateral lateral neck pain   Improved exercise technique, movement at target joints, use of target muscles after mod verbal, visual, tactile cues.    Patient is a 77 year old female who came to physical therapy secondary to neck pain. She also presents with limited neck ROM with reproduction of symptoms, poor posture, posterior cervical paraspinal and upper trap muscle tension, weakness, TTP, and difficulty performing functional movements such as turning her head to look around. Patient will benefit from skilled physical therapy services to address the aforementioned deficits.      PT Education - 12/12/17 1647    Education provided  Yes    Education Details  ther-ex, HEP, plan of care    Person(s) Educated  Patient    Methods  Explanation;Demonstration;Tactile cues;Verbal cues;Handout    Comprehension  Verbalized  understanding;Returned demonstration          PT Long Term Goals - 12/12/17 1444      PT LONG TERM GOAL #1   Title  Patient will have a decrease in neck pain to 5/10 or less at worst to promote ability to turn her head with less pain.     Baseline  10/10 neck pain (bilateral lateral neck) at worst for the past 2 months (12/12/2017)    Time  5    Period  Weeks    Status  New    Target Date  01/18/18      PT LONG TERM GOAL #2   Title  Patient will improve her NDI score by at least 12% as a demonstration of improved function.     Baseline  28% (12/12/2017)    Time  5    Period  Weeks    Status  New    Target Date  01/18/18      PT LONG TERM GOAL #3   Title  Patient will improve her neck FOTO score to at least 61 as a demonstration of improved function.     Baseline  48 (12/12/2017)    Time  5    Period  Weeks    Status  New    Target Date  01/18/18             Plan - 12/12/17 1433    Clinical Impression Statement  Patient is a 77 year old female who came to physical therapy secondary to neck pain. She also presents with limited neck  ROM with reproduction of symptoms, poor posture, posterior cervical paraspinal and upper trap muscle tension, weakness, TTP, and difficulty performing functional movements such as turning her head to look around. Patient will benefit from skilled physical therapy services to address the aforementioned deficits.     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 Difficulty turning her head.     Clinical Presentation  Evolving    Clinical Presentation due to:  Pain has worsened since onset per pt    Clinical Decision Making  Moderate    Rehab Potential  Fair    Clinical Impairments Affecting Rehab Potential  (-) medical history, age, chronicity of condition. Pt is the caregiver of her husband who has dementia. (+)  motivated.     PT Frequency  2x / week    PT Duration  Other (comment) 5 weeks    PT Treatment/Interventions  Therapeutic exercise;Neuromuscular re-education;Therapeutic activities;Manual techniques;Electrical Stimulation;Iontophoresis 4mg /ml Dexamethasone;Ultrasound;Patient/family education;Dry needling    PT Next Visit Plan  manual techniques, scapular strengthening, posture    Consulted and Agree with Plan of Care  Patient       Patient will benefit from skilled therapeutic intervention in order to improve the following deficits and impairments:  Pain, Postural dysfunction, Improper body mechanics, Decreased strength, Decreased range of motion  Visit Diagnosis: Cervicalgia - Plan: PT plan of care cert/re-cert  Radiculopathy, cervical region - Plan: PT plan of care cert/re-cert     Problem List Patient Active Problem List   Diagnosis Date Noted  . Anemia, unspecified 03/09/2017  . Acute gastric ulcer with hemorrhage 02/22/2017  . Melena 02/05/2017  . GIB (gastrointestinal bleeding) 02/04/2017  . Other iron deficiency anemia 05/26/2016  . Degeneration of intervertebral disc of lumbar region 12/10/2015  . Dizziness 09/28/2015  . Other allergic rhinitis 08/05/2015  . Mass of nasopharynx 09/24/2014  . Arthritis due to gout 09/01/2014  . Airway hyperreactivity 09/01/2014  . Malignant neoplasm of thyroid gland ([place]) 09/01/2014  . Essential (primary) hypertension 09/01/2014  . Gastro-esophageal reflux disease without esophagitis 09/01/2014  . Paroxysmal digital cyanosis 09/01/2014    Joneen Boers PT, DPT   12/12/2017, 4:57 PM  [place] [facility] [address] [place], [address] Phone: [phone]   Fax:  [phone]  Name: Sara Fields MRN: [id number] Date of Birth: [birth year]

## 2017-12-12 NOTE — Patient Instructions (Signed)
    Scapular Retraction (Sitting)   With arms at sides, pinch shoulder blades together. Keep shoulder blades down.  Hold for 5 seconds. Repeat __10__ times per set. Do __3__ sets per session.     Copyright  VHI. All rights reserved.

## 2017-12-14 ENCOUNTER — Ambulatory Visit: Payer: Medicare Other

## 2017-12-14 DIAGNOSIS — M542 Cervicalgia: Secondary | ICD-10-CM | POA: Diagnosis not present

## 2017-12-14 DIAGNOSIS — M5412 Radiculopathy, cervical region: Secondary | ICD-10-CM

## 2017-12-14 NOTE — Therapy (Signed)
[place] [facility] [address] [place], [address] Phone: [phone]   Fax:  [phone]  Physical Therapy Treatment  Patient Details  Name: Sara Fields MRN: [id number] Date of Birth: 1940/12/31 Referring Provider: Charlaine Dalton, MD   Encounter Date: 12/14/2017  PT End of Session - 12/14/17 1651    Visit Number  1    Number of Visits  11    Date for PT Re-Evaluation  01/18/18    PT Start Time  5732    PT Stop Time  1732    PT Time Calculation (min)  41 min    Activity Tolerance  Patient tolerated treatment well    Behavior During Therapy  [facility] for tasks assessed/performed       Past Medical History:  Diagnosis Date  . Anemia   . Asthma   . GERD (gastroesophageal reflux disease)   . Hyperlipidemia   . Hypertension   . Thyroid cancer ([place]) 1999 and 2015   Partial thyroidectomy with rad tx's.     Past Surgical History:  Procedure Laterality Date  . ABDOMINAL HYSTERECTOMY  1973   partial  . ESOPHAGOGASTRODUODENOSCOPY N/A 02/05/2017   Procedure: ESOPHAGOGASTRODUODENOSCOPY (EGD);  Surgeon: Wilford Corner, MD;  Location: [facility] ENDOSCOPY;  Service: Endoscopy;  Laterality: N/A;  . ESOPHAGOGASTRODUODENOSCOPY (EGD) WITH PROPOFOL N/A 11/16/2015   Procedure: ESOPHAGOGASTRODUODENOSCOPY (EGD) WITH PROPOFOL;  Surgeon: Josefine Class, MD;  Location: [facility] ENDOSCOPY;  Service: Endoscopy;  Laterality: N/A;  . THYROID SURGERY  1999 and 2015   Partial Thyroidectomy    There were no vitals filed for this visit.  Subjective Assessment - 12/14/17 1653    Subjective  Felt ache in her neck last night. Had spasms today when getting her hair done. No neck pain currently.     Pertinent History  Neck pain. Pt states that she does not know if her neck pain is from the radiation (last radiation was on July 2015) or from her back pain.  Neck pain began 4 months ago, not constant. Feels spams which comes and goes.  Pain  is located bilateral lateral neck.  Pain began suddenly but pain progresively worsened.  Denies changes in vision. Pt states being iron deficient and might be feeling weak because of it. Both UE and LE feel equally weak. Usually gets iron infusions every 4-5 months as needed.  Denies loss of bowel or bladder function.  Gets regular check ups from her oncologist and family doctor.  Pt still has to do everything whether it hurts or not because she is her husband's caregiver. Her husband has demetia.     Diagnostic tests  Had an MRI for her neck a few years ago.     Patient Stated Goals  Wants the pain to go away.     Currently in Pain?  No/denies    Pain Onset  More than a month ago                              PT Education - 12/14/17 1654    Education provided  Yes    Education Details  ther-ex, HEP    Person(s) Educated  Patient    Methods  Explanation;Demonstration;Tactile cues;Verbal cues;Handout    Comprehension  Verbalized understanding;Returned demonstration       Objectives   CT Soft tissue of neck 2017: " Atherosclerosis. Fusiform left ICA aneurysm at the skullbase measuring 11  mm."    Therapeutic exercise  Cervical rotation: R SCM discomfort with R cervical rotation. No pain with L cervical rotation.   Seated gentle chin tuck 10x5 seconds for 3 sets. No neck pain with cervical rotation  Seated gentle nod isometrics light to medium resistance 10x5 seconds  Seated bilateral scap retract 10x5 seconds for 3 sets  Seated open books 10x3 to promote gentle thoracic extension and scapular muscle use.           Reviewed HEP. Pt demonstrated and verbalized understanding. Handout provided.    Improved exercise technique, movement at target joints, use of target muscles after mod verbal, visual, tactile cues.      Manual therapy  STM R rhomboids and upper trap muscle tension in sitting              Pt states neck feels better afterwards.    Decreased bilateral lateral neck pain with R and L cervical rotation with exercises that help promote gentle activation of anterior cervical muscles and use of middle and lower trap muscles (to help decrease over use of SCM and upper trap muscles). Pt will benefit from continued skilled PT services to promote ability to turn her head with less pain.      PT Long Term Goals - 12/12/17 1444      PT LONG TERM GOAL #1   Title  Patient will have a decrease in neck pain to 5/10 or less at worst to promote ability to turn her head with less pain.     Baseline  10/10 neck pain (bilateral lateral neck) at worst for the past 2 months (12/12/2017)    Time  5    Period  Weeks    Status  New    Target Date  01/18/18      PT LONG TERM GOAL #2   Title  Patient will improve her NDI score by at least 12% as a demonstration of improved function.     Baseline  28% (12/12/2017)    Time  5    Period  Weeks    Status  New    Target Date  01/18/18      PT LONG TERM GOAL #3   Title  Patient will improve her neck FOTO score to at least 61 as a demonstration of improved function.     Baseline  48 (12/12/2017)    Time  5    Period  Weeks    Status  New    Target Date  01/18/18            Plan - 12/14/17 2015    Clinical Impression Statement  Decreased bilateral lateral neck pain with R and L cervical rotation with exercises that help promote gentle activation of anterior cervical muscles and use of middle and lower trap muscles (to help decrease over use of SCM and upper trap muscles). Pt will benefit from continued skilled PT services to promote ability to turn her head with less pain.     Rehab Potential  Fair    Clinical Impairments Affecting Rehab Potential  (-) medical history, age, chronicity of condition. Pt is the caregiver of her husband who has dementia. (+) motivated.     PT Frequency  2x / week    PT Duration  Other (comment) 5 weeks    PT Treatment/Interventions  Therapeutic  exercise;Neuromuscular re-education;Therapeutic activities;Manual techniques;Electrical Stimulation;Iontophoresis 4mg /ml Dexamethasone;Ultrasound;Patient/family education;Dry needling    PT Next Visit Plan  manual techniques, scapular strengthening,  posture    Consulted and Agree with Plan of Care  Patient       Patient will benefit from skilled therapeutic intervention in order to improve the following deficits and impairments:  Pain, Postural dysfunction, Improper body mechanics, Decreased strength, Decreased range of motion  Visit Diagnosis: Cervicalgia  Radiculopathy, cervical region     Problem List Patient Active Problem List   Diagnosis Date Noted  . Anemia, unspecified 03/09/2017  . Acute gastric ulcer with hemorrhage 02/22/2017  . Melena 02/05/2017  . GIB (gastrointestinal bleeding) 02/04/2017  . Other iron deficiency anemia 05/26/2016  . Degeneration of intervertebral disc of lumbar region 12/10/2015  . Dizziness 09/28/2015  . Other allergic rhinitis 08/05/2015  . Mass of nasopharynx 09/24/2014  . Arthritis due to gout 09/01/2014  . Airway hyperreactivity 09/01/2014  . Malignant neoplasm of thyroid gland ([place]) 09/01/2014  . Essential (primary) hypertension 09/01/2014  . Gastro-esophageal reflux disease without esophagitis 09/01/2014  . Paroxysmal digital cyanosis 09/01/2014    Joneen Boers PT, DPT   12/14/2017, 8:20 PM  [place] [facility] [address], [place], [address] Phone: [phone]   Fax:  [phone]  Name: Sara Fields MRN: [id number] Date of Birth: December 25, 1940

## 2017-12-14 NOTE — Patient Instructions (Signed)
Seated chin tucks  Sitting on a chair, and looking straight ahead: gently give yourself a double chin.      Hold for 5 seconds.    Gently rest.    Repeat 10 times.    Perform 2 to 3 sets daily.

## 2017-12-19 ENCOUNTER — Ambulatory Visit: Payer: Medicare Other

## 2017-12-19 DIAGNOSIS — M542 Cervicalgia: Secondary | ICD-10-CM | POA: Diagnosis not present

## 2017-12-19 DIAGNOSIS — M5412 Radiculopathy, cervical region: Secondary | ICD-10-CM

## 2017-12-19 NOTE — Therapy (Signed)
[place] [facility] [address], [place], [address] Phone: [phone]   Fax:  [phone]  Physical Therapy Treatment  Patient Details  Name: Sara Fields MRN: [id number] Date of Birth: 02-25-1941 Referring Provider: Charlaine Dalton, MD   Encounter Date: 12/19/2017  PT End of Session - 12/19/17 1523    Visit Number  3    Number of Visits  11    Date for PT Re-Evaluation  01/18/18    PT Start Time  2353    PT Stop Time  6144    PT Time Calculation (min)  41 min    Activity Tolerance  Patient tolerated treatment well    Behavior During Therapy  [facility] for tasks assessed/performed       Past Medical History:  Diagnosis Date  . Anemia   . Asthma   . GERD (gastroesophageal reflux disease)   . Hyperlipidemia   . Hypertension   . Thyroid cancer ([place]) 1999 and 2015   Partial thyroidectomy with rad tx's.     Past Surgical History:  Procedure Laterality Date  . ABDOMINAL HYSTERECTOMY  1973   partial  . ESOPHAGOGASTRODUODENOSCOPY N/A 02/05/2017   Procedure: ESOPHAGOGASTRODUODENOSCOPY (EGD);  Surgeon: Wilford Corner, MD;  Location: [facility] ENDOSCOPY;  Service: Endoscopy;  Laterality: N/A;  . ESOPHAGOGASTRODUODENOSCOPY (EGD) WITH PROPOFOL N/A 11/16/2015   Procedure: ESOPHAGOGASTRODUODENOSCOPY (EGD) WITH PROPOFOL;  Surgeon: Josefine Class, MD;  Location: [facility] ENDOSCOPY;  Service: Endoscopy;  Laterality: N/A;  . THYROID SURGERY  1999 and 2015   Partial Thyroidectomy    There were no vitals filed for this visit.  Subjective Assessment - 12/19/17 1525    Subjective  Neck was spasming when looking up and turning her head to the R while watching TV in the waiting room. No pain currently. Did her chin tucks when feeling spams which helped relax it.     Pertinent History  Neck pain. Pt states that she does not know if her neck pain is from the radiation (last radiation was on July 2015) or from her back pain.  Neck  pain began 4 months ago, not constant. Feels spams which comes and goes.  Pain is located bilateral lateral neck.  Pain began suddenly but pain progresively worsened.  Denies changes in vision. Pt states being iron deficient and might be feeling weak because of it. Both UE and LE feel equally weak. Usually gets iron infusions every 4-5 months as needed.  Denies loss of bowel or bladder function.  Gets regular check ups from her oncologist and family doctor.  Pt still has to do everything whether it hurts or not because she is her husband's caregiver. Her husband has demetia.     Diagnostic tests  Had an MRI for her neck a few years ago.     Patient Stated Goals  Wants the pain to go away.     Currently in Pain?  No/denies    Pain Score  0-No pain at rest    Pain Onset  More than a month ago                              PT Education - 12/19/17 1537    Education provided  Yes    Education Details  ther-ex    [employer]) Educated  Patient    Methods  Explanation;Demonstration;Verbal cues;Tactile cues    Comprehension  Returned demonstration;Verbalized understanding  Objectives   CT Soft tissue of neck 2017: " Atherosclerosis. Fusiform left ICA aneurysm at the skullbase measuring 11 mm."    Therapeutic exercise   Cervical rotation: R SCM discomfort with R cervical rotation. No pain with L cervical rotation.     R cervical rotation with slight chin tuck. Less R neck pain  Seated bilateral scap retract targeting lower trap     10x5 seconds for 2 sets R    10x5 seconds for 2 sets L  Seated gentle chin tuck 10x5 seconds with pt pressing tongue at roof of mouth to help with anterior cervical muscle activation.   Seated open books 10x3 to promote gentle thoracic extension and scapular muscle use.   Seated R and L cervical rotation 3x while maintaining scapular retraction. No pain.   Improved exercise technique, movement at target joints, use of target  muscles after min to mod verbal, visual, tactile cues.     Manual therapy  STM R rhomboids and upper trapmuscle to decrease tension in sitting STM L and R cervical paraspinal muscles to decrease tension in sitting.    Improved ease with R and L cervical rotation without pain after exercises that promote scapular, and gentle anterior cervical muscle strengthening as well as thoracic extension to decrease pressure to lower cervical area and manual therapy to decrease upper trap and rhomboid muscle tension.        PT Long Term Goals - 12/12/17 1444      PT LONG TERM GOAL #1   Title  Patient will have a decrease in neck pain to 5/10 or less at worst to promote ability to turn her head with less pain.     Baseline  10/10 neck pain (bilateral lateral neck) at worst for the past 2 months (12/12/2017)    Time  5    Period  Weeks    Status  New    Target Date  01/18/18      PT LONG TERM GOAL #2   Title  Patient will improve her NDI score by at least 12% as a demonstration of improved function.     Baseline  28% (12/12/2017)    Time  5    Period  Weeks    Status  New    Target Date  01/18/18      PT LONG TERM GOAL #3   Title  Patient will improve her neck FOTO score to at least 61 as a demonstration of improved function.     Baseline  48 (12/12/2017)    Time  5    Period  Weeks    Status  New    Target Date  01/18/18            Plan - 12/19/17 1539    Clinical Impression Statement  Improved ease with R and L cervical rotation without pain after exercises that promote scapular, and gentle anterior cervical muscle strengthening as well as thoracic extension to decrease pressure to lower cervical area and manual therapy to decrease upper trap and rhomboid muscle tension.     Rehab Potential  Fair    Clinical Impairments Affecting Rehab Potential  (-) medical history, age, chronicity of condition. Pt is the caregiver of her husband who has dementia. (+) motivated.      PT Frequency  2x / week    PT Duration  Other (comment) 5 weeks    PT Treatment/Interventions  Therapeutic exercise;Neuromuscular re-education;Therapeutic activities;Manual techniques;Electrical Stimulation;Iontophoresis 4mg /ml Dexamethasone;Ultrasound;Patient/family education;Dry needling  PT Next Visit Plan  manual techniques, scapular strengthening, posture    Consulted and Agree with Plan of Care  Patient       Patient will benefit from skilled therapeutic intervention in order to improve the following deficits and impairments:  Pain, Postural dysfunction, Improper body mechanics, Decreased strength, Decreased range of motion  Visit Diagnosis: Cervicalgia  Radiculopathy, cervical region     Problem List Patient Active Problem List   Diagnosis Date Noted  . Anemia, unspecified 03/09/2017  . Acute gastric ulcer with hemorrhage 02/22/2017  . Melena 02/05/2017  . GIB (gastrointestinal bleeding) 02/04/2017  . Other iron deficiency anemia 05/26/2016  . Degeneration of intervertebral disc of lumbar region 12/10/2015  . Dizziness 09/28/2015  . Other allergic rhinitis 08/05/2015  . Mass of nasopharynx 09/24/2014  . Arthritis due to gout 09/01/2014  . Airway hyperreactivity 09/01/2014  . Malignant neoplasm of thyroid gland ([place]) 09/01/2014  . Essential (primary) hypertension 09/01/2014  . Gastro-esophageal reflux disease without esophagitis 09/01/2014  . Paroxysmal digital cyanosis 09/01/2014    Joneen Boers PT, DPT   12/19/2017, 7:27 PM  [place] [facility] [address] [place], [address] Phone: [phone]   Fax:  [phone]  Name: RONNISHA FELBER MRN: [id number] Date of Birth: 1941-03-22

## 2017-12-21 ENCOUNTER — Ambulatory Visit: Payer: Medicare Other

## 2017-12-25 ENCOUNTER — Ambulatory Visit: Payer: Medicare Other

## 2017-12-27 ENCOUNTER — Ambulatory Visit: Payer: Medicare Other

## 2018-03-30 ENCOUNTER — Inpatient Hospital Stay: Payer: Medicare Other

## 2018-03-30 ENCOUNTER — Inpatient Hospital Stay: Payer: Medicare Other | Admitting: Nurse Practitioner

## 2018-04-05 ENCOUNTER — Inpatient Hospital Stay (HOSPITAL_BASED_OUTPATIENT_CLINIC_OR_DEPARTMENT_OTHER): Payer: Medicare Other | Admitting: Nurse Practitioner

## 2018-04-05 ENCOUNTER — Inpatient Hospital Stay: Payer: Medicare Other | Attending: Nurse Practitioner

## 2018-04-05 ENCOUNTER — Inpatient Hospital Stay: Payer: Medicare Other

## 2018-04-05 ENCOUNTER — Encounter: Payer: Self-pay | Admitting: Nurse Practitioner

## 2018-04-05 DIAGNOSIS — C73 Malignant neoplasm of thyroid gland: Secondary | ICD-10-CM

## 2018-04-05 DIAGNOSIS — I998 Other disorder of circulatory system: Secondary | ICD-10-CM | POA: Insufficient documentation

## 2018-04-05 DIAGNOSIS — D472 Monoclonal gammopathy: Secondary | ICD-10-CM | POA: Diagnosis not present

## 2018-04-05 DIAGNOSIS — N189 Chronic kidney disease, unspecified: Secondary | ICD-10-CM | POA: Insufficient documentation

## 2018-04-05 DIAGNOSIS — D509 Iron deficiency anemia, unspecified: Secondary | ICD-10-CM

## 2018-04-05 LAB — CBC WITH DIFFERENTIAL/PLATELET
BASOS ABS: 0 10*3/uL (ref 0–0.1)
Basophils Relative: 0 %
EOS PCT: 2 %
Eosinophils Absolute: 0.1 10*3/uL (ref 0–0.7)
HEMATOCRIT: 37.8 % (ref 35.0–47.0)
Hemoglobin: 12 g/dL (ref 12.0–16.0)
LYMPHS PCT: 13 %
Lymphs Abs: 0.7 10*3/uL — ABNORMAL LOW (ref 1.0–3.6)
MCH: 30.3 pg (ref 26.0–34.0)
MCHC: 31.8 g/dL — AB (ref 32.0–36.0)
MCV: 95.4 fL (ref 80.0–100.0)
MONO ABS: 0.6 10*3/uL (ref 0.2–0.9)
MONOS PCT: 12 %
Neutro Abs: 3.6 10*3/uL (ref 1.4–6.5)
Neutrophils Relative %: 73 %
PLATELETS: 252 10*3/uL (ref 150–440)
RBC: 3.96 MIL/uL (ref 3.80–5.20)
RDW: 16.4 % — AB (ref 11.5–14.5)
WBC: 5 10*3/uL (ref 3.6–11.0)

## 2018-04-05 NOTE — Progress Notes (Signed)
[place] OFFICE PROGRESS NOTE  Patient Care Team: Adin Hector, MD as PCP - General (Internal Medicine) Cammie Sickle, MD as Medical Oncologist (Hematology and Oncology)  Cancer Staging No matching staging information was found for the patient.   Oncology History   1999- right thyroid lobectomy by Dr. Orene Desanctis with unusual poorly differentiated cancer, no additional treatment  2015- recurrent mass in thyroid bed. PET-CT showed FDG avid lesion in thyroid bed + nasopharyx.  2015- Dr. 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 Margins not noted [facility], [place], #[id number]).   01/05/2014- Nasopharyngeal biopsy negative for malignancy  04/25/14- completed postoperative XRT completing 65.25 CGy in 29 fractions  05/2014 + 07/2014- PET avidity at the left nasopharynx and left oropharyngeal soft tissue as well as a hypermetabolic 1.5 cm mass in the right thyroid bed. Dr Johney Frame excised the paratracheal mass on 09/02/2014. Inspect of the nasopharynx at the time of surgery revealed no obvious abnormality and no biopsy was completed.   09/2014- NP avid area biopsied and negative for carcinoma. SHe underwent a thyroid FNA and that was negative for cancer.  # Chronic Anemia- IDA [ EGD- hiatal hernia; Jan 2017- Dr.Rein] AUG 2017- IV Venofer  # AUG 2017- Thyroglobulin 40/Sep 2017 US neck- NED  # Hoarseness of voice- sec to RT [Dr.Bennett; Dec 2017]  # AUG 2017- IgG kappa 0.4gm//N-kappa-Lamda light chains       Malignant neoplasm of thyroid gland ([place])   09/01/2014 Initial Diagnosis    Malignant neoplasm of thyroid gland ([place])        INTERVAL HISTORY:  Sara Fields 77 y.o.  female pleasant patient above history of thyroid cancer, iron deficiency anemia, and MGUS, who returns to clinic for follow-up.    She reports overall feeling well and says she doesn't feel she needs iron today.   She was seen in lab today as staff was unable to successfully draw blood or place PIV. Multiple attempts were made.    REVIEW OF SYSTEMS:  A complete 10 point review of system is done which is negative except mentioned above/history of present illness.   PAST MEDICAL HISTORY :  Past Medical History:  Diagnosis Date  . Anemia   . Asthma   . GERD (gastroesophageal reflux disease)   . Hyperlipidemia   . Hypertension   . Thyroid cancer ([place]) 1999 and 2015   Partial thyroidectomy with rad tx's.     PAST SURGICAL HISTORY :   Past Surgical History:  Procedure Laterality Date  . ABDOMINAL HYSTERECTOMY  1973   partial  . ESOPHAGOGASTRODUODENOSCOPY N/A 02/05/2017   Procedure: ESOPHAGOGASTRODUODENOSCOPY (EGD);  Surgeon: Wilford Corner, MD;  Location: [facility] ENDOSCOPY;  Service: Endoscopy;  Laterality: N/A;  . ESOPHAGOGASTRODUODENOSCOPY (EGD) WITH PROPOFOL N/A 11/16/2015   Procedure: ESOPHAGOGASTRODUODENOSCOPY (EGD) WITH PROPOFOL;  Surgeon: Josefine Class, MD;  Location: [facility] ENDOSCOPY;  Service: Endoscopy;  Laterality: N/A;  . THYROID SURGERY  1999 and 2015   Partial Thyroidectomy    FAMILY HISTORY :   Family History  Problem Relation Age of Onset  . Lung cancer Father   . Hypertension Father   . Bone cancer Sister   . Hypertension Sister   . Lung cancer Brother   . Hypertension Brother   . Kidney cancer Sister   . Hypertension Sister   . Lung cancer Sister   .  Hypertension Sister   . Breast cancer Neg Hx     SOCIAL HISTORY:   Social History   Tobacco Use  . Smoking status: Never Smoker  . Smokeless tobacco: Never Used  Substance Use Topics  . Alcohol use: No  . Drug use: No    ALLERGIES:  has No Known Allergies.  MEDICATIONS:  Current Outpatient Medications  Medication Sig Dispense Refill  . albuterol (PROVENTIL HFA;VENTOLIN HFA) 108 (90 Base) MCG/ACT inhaler Inhale into the  lungs every 6 (six) hours as needed for wheezing or shortness of breath.    . allopurinol (ZYLOPRIM) 100 MG tablet Take 100 mg by mouth daily.    Marland Kitchen amLODipine (NORVASC) 5 MG tablet Take 1 tablet by mouth.    Marland Kitchen azelastine (ASTELIN) 0.1 % nasal spray Place 1 spray into both nostrils daily as needed for rhinitis or allergies.     . budesonide-formoterol (SYMBICORT) 160-4.5 MCG/ACT inhaler Inhale 2 puffs into the lungs 2 (two) times daily.    . bumetanide (BUMEX) 1 MG tablet Take 1 mg by mouth daily.    . calcium carbonate (CALCIUM 600) 600 MG TABS tablet Take 600 mg by mouth 2 (two) times daily with a meal.    . levothyroxine (SYNTHROID) 25 MCG tablet Take one pill along with 50 mcg pill [total 75 mcg] prior to breakfast 90 tablet 3  . LORazepam (ATIVAN) 0.5 MG tablet     . meclizine (ANTIVERT) 12.5 MG tablet Take by mouth.    . montelukast (SINGULAIR) 10 MG tablet Take 10 mg by mouth at bedtime.    . pantoprazole (PROTONIX) 40 MG tablet Take 1 tablet (40 mg total) by mouth 2 (two) times daily before a meal. 60 tablet 1  . potassium chloride SA (KLOR-CON M20) 20 MEQ tablet Take 20 mEq by mouth daily.     . propranolol (INDERAL) 20 MG tablet Take 20 mg by mouth 3 (three) times daily.    . rosuvastatin (CRESTOR) 10 MG tablet Take 10 mg by mouth daily.     . traMADol (ULTRAM) 50 MG tablet Take 50 mg by mouth every 6 (six) hours as needed.    . Turmeric 500 MG TABS Take 1 tablet by mouth daily.     No current facility-administered medications for this visit.    Facility-Administered Medications Ordered in Other Visits  Medication Dose Route Frequency Provider Last Rate Last Dose  . 0.9 %  sodium chloride infusion   Intravenous Once Charlaine Dalton R, MD      . iron sucrose (VENOFER) 200 mg IVPB  200 mg Intravenous Once Cammie Sickle, MD        PHYSICAL EXAMINATION: ECOG PERFORMANCE STATUS:   There were no vitals taken for this visit.  There were no vitals filed for this  visit.  GENERAL: Well-nourished well-developed; Alert, no distress and comfortable. Alone.   EYES: no pallor or icterus OROPHARYNX: no thrush or ulceration NECK: well healed surgical scar LYMPH: no palpable lymphadenopathy in the axillary or inguinal regions LUNGS: hoarse voice; speaking in full sentences HEART/CVS: No lower extremity edema Musculoskeletal: no clubbing  PSYCH: alert & oriented x 3 with fluent speech NEURO: no focal motor/sensory deficits SKIN: no rashes or significant lesions  LABORATORY DATA:  I have reviewed the data as listed    Component Value Date/Time   NA 135 11/24/2017 1340   K 3.4 (L) 11/24/2017 1340   CL 98 (L) 11/24/2017 1340   CO2 25 11/24/2017 1340   GLUCOSE  103 (H) 11/24/2017 1340   BUN 29 (H) 11/24/2017 1340   CREATININE 1.49 (H) 11/24/2017 1340   CALCIUM 8.8 (L) 11/24/2017 1340   PROT 8.0 08/21/2017 1400   ALBUMIN 4.2 08/21/2017 1400   AST 23 08/21/2017 1400   ALT 8 (L) 08/21/2017 1400   ALKPHOS 90 08/21/2017 1400   BILITOT 0.7 08/21/2017 1400   GFRNONAA 33 (L) 11/24/2017 1340   GFRAA 38 (L) 11/24/2017 1340    No results found for: SPEP, UPEP  Lab Results  Component Value Date   WBC 5.0 04/05/2018   NEUTROABS 3.6 04/05/2018   HGB 12.0 04/05/2018   HCT 37.8 04/05/2018   MCV 95.4 04/05/2018   PLT 252 04/05/2018      Chemistry      Component Value Date/Time   NA 135 11/24/2017 1340   K 3.4 (L) 11/24/2017 1340   CL 98 (L) 11/24/2017 1340   CO2 25 11/24/2017 1340   BUN 29 (H) 11/24/2017 1340   CREATININE 1.49 (H) 11/24/2017 1340      Component Value Date/Time   CALCIUM 8.8 (L) 11/24/2017 1340   ALKPHOS 90 08/21/2017 1400   AST 23 08/21/2017 1400   ALT 8 (L) 08/21/2017 1400   BILITOT 0.7 08/21/2017 1400       RADIOGRAPHIC STUDIES: I have personally reviewed the radiological images as listed and agreed with the findings in the report. No results found.   ASSESSMENT & PLAN:  No problem-specific Assessment & Plan notes  found for this encounter. Thyroid Cancer - local recurrence s/p surgery. Unable to check Thyroid function today. Asymptomatic.   IDA/CKD- hmg 12.0, hct 37.8 today. Unable to check cmp. Encouraged to continue oral iron pills and will hold off on IV Venofer at this time. Iron studies pending.   MGUS- will have patient return to clinic to check try to check labs again in 1 week  CKD- previously Cr 1.49. Will check UA at next visit to further evaluate.   Neck Stiffness and pain- likely secondary to treatments including surgery and radiation to neck for treatment of thyroid cancer.   Poor Venous Access- Unable to draw sufficient quantity of blood from patient in clinic today to collect labs. Advised her to increase fluid intake and return to clinic in 1 week to re-attempt. If unsuccessful, she will likely need a port.   rtc in 1 week to attempt lab draw then again in 3 weeks for evaluation by Dr. Rogue Bussing and consideration of Venofer.   Orders Placed This Encounter  Procedures  . CBC with Differential/Platelet    Standing Status:   Future    Standing Expiration Date:   04/06/2019  . Comprehensive metabolic panel    Standing Status:   Future    Standing Expiration Date:   04/06/2019  . Multiple Myeloma Panel (SPEP&IFE w/QIG)    Standing Status:   Future    Standing Expiration Date:   04/06/2019  . Iron and TIBC    Standing Status:   Future    Standing Expiration Date:   04/06/2019  . Ferritin    Standing Status:   Future    Standing Expiration Date:   04/06/2019  . Urinalysis, Complete w Microscopic    Standing Status:   Future    Standing Expiration Date:   04/06/2019  . TgAb+Thyroglobulin IMA or RIA    Standing Status:   Future    Standing Expiration Date:   04/06/2019  . Thyroid Panel With TSH  Standing Status:   Future    Standing Expiration Date:   04/06/2019  . Kappa/lambda light chains    Standing Status:   Future    Standing Expiration Date:   04/06/2019   All questions were  answered. The patient knows to call the clinic with any problems, questions or concerns.    A total of (10) minutes of face-to-face time was spent with this patient with greater than 50% of that time in counseling and care-coordination.  Beckey Rutter, DNP, AGNP-C [place] [facility] [place] (work cell) [phone] (office) 04/05/18 3:51 PM  CC: Dr. Caryl Comes Dr. Rogue Bussing

## 2018-04-12 ENCOUNTER — Inpatient Hospital Stay: Payer: Medicare Other

## 2018-04-12 DIAGNOSIS — D472 Monoclonal gammopathy: Secondary | ICD-10-CM

## 2018-04-12 DIAGNOSIS — D509 Iron deficiency anemia, unspecified: Secondary | ICD-10-CM

## 2018-04-12 DIAGNOSIS — C73 Malignant neoplasm of thyroid gland: Secondary | ICD-10-CM | POA: Diagnosis not present

## 2018-04-12 LAB — CBC WITH DIFFERENTIAL/PLATELET
Basophils Absolute: 0 10*3/uL (ref 0–0.1)
Basophils Relative: 1 %
EOS ABS: 0.1 10*3/uL (ref 0–0.7)
EOS PCT: 3 %
HCT: 31.5 % — ABNORMAL LOW (ref 35.0–47.0)
Hemoglobin: 10.3 g/dL — ABNORMAL LOW (ref 12.0–16.0)
LYMPHS ABS: 0.7 10*3/uL — AB (ref 1.0–3.6)
LYMPHS PCT: 16 %
MCH: 30.2 pg (ref 26.0–34.0)
MCHC: 32.7 g/dL (ref 32.0–36.0)
MCV: 92.2 fL (ref 80.0–100.0)
MONOS PCT: 9 %
Monocytes Absolute: 0.4 10*3/uL (ref 0.2–0.9)
Neutro Abs: 2.9 10*3/uL (ref 1.4–6.5)
Neutrophils Relative %: 71 %
PLATELETS: 306 10*3/uL (ref 150–440)
RBC: 3.41 MIL/uL — ABNORMAL LOW (ref 3.80–5.20)
RDW: 16.1 % — ABNORMAL HIGH (ref 11.5–14.5)
WBC: 4.1 10*3/uL (ref 3.6–11.0)

## 2018-04-12 LAB — URINALYSIS, COMPLETE (UACMP) WITH MICROSCOPIC
BACTERIA UA: NONE SEEN
BILIRUBIN URINE: NEGATIVE
Glucose, UA: NEGATIVE mg/dL
Hgb urine dipstick: NEGATIVE
Ketones, ur: NEGATIVE mg/dL
Leukocytes, UA: NEGATIVE
NITRITE: NEGATIVE
PROTEIN: NEGATIVE mg/dL
Specific Gravity, Urine: 1.01 (ref 1.005–1.030)
pH: 5 (ref 5.0–8.0)

## 2018-04-12 LAB — COMPREHENSIVE METABOLIC PANEL
ALK PHOS: 75 U/L (ref 38–126)
ALT: 9 U/L — ABNORMAL LOW (ref 14–54)
ANION GAP: 9 (ref 5–15)
AST: 22 U/L (ref 15–41)
Albumin: 4.4 g/dL (ref 3.5–5.0)
BUN: 26 mg/dL — ABNORMAL HIGH (ref 6–20)
CALCIUM: 9.3 mg/dL (ref 8.9–10.3)
CHLORIDE: 105 mmol/L (ref 101–111)
CO2: 22 mmol/L (ref 22–32)
CREATININE: 1.07 mg/dL — AB (ref 0.44–1.00)
GFR, EST AFRICAN AMERICAN: 57 mL/min — AB (ref >60)
GFR, EST NON AFRICAN AMERICAN: 49 mL/min — AB (ref >60)
Glucose, Bld: 109 mg/dL — ABNORMAL HIGH (ref 65–99)
Potassium: 4.2 mmol/L (ref 3.5–5.1)
Sodium: 136 mmol/L (ref 135–145)
Total Bilirubin: 0.6 mg/dL (ref 0.3–1.2)
Total Protein: 7.9 g/dL (ref 6.5–8.1)

## 2018-04-12 LAB — IRON AND TIBC
IRON: 42 ug/dL (ref 28–170)
SATURATION RATIOS: 14 % (ref 10.4–31.8)
TIBC: 299 ug/dL (ref 250–450)
UIBC: 257 ug/dL

## 2018-04-12 LAB — FERRITIN: Ferritin: 176 ng/mL (ref 11–307)

## 2018-04-13 ENCOUNTER — Other Ambulatory Visit: Payer: Self-pay | Admitting: *Deleted

## 2018-04-13 DIAGNOSIS — N189 Chronic kidney disease, unspecified: Secondary | ICD-10-CM

## 2018-04-13 DIAGNOSIS — R7989 Other specified abnormal findings of blood chemistry: Secondary | ICD-10-CM

## 2018-04-13 LAB — THYROID PANEL WITH TSH
FREE THYROXINE INDEX: 2 (ref 1.2–4.9)
T3 UPTAKE RATIO: 25 % (ref 24–39)
T4 TOTAL: 7.9 ug/dL (ref 4.5–12.0)
TSH: 3.21 u[IU]/mL (ref 0.450–4.500)

## 2018-04-13 LAB — MULTIPLE MYELOMA PANEL, SERUM
Albumin SerPl Elph-Mcnc: 3.9 g/dL (ref 2.9–4.4)
Albumin/Glob SerPl: 1.4 (ref 0.7–1.7)
Alpha 1: 0.2 g/dL (ref 0.0–0.4)
Alpha2 Glob SerPl Elph-Mcnc: 0.9 g/dL (ref 0.4–1.0)
B-Globulin SerPl Elph-Mcnc: 0.9 g/dL (ref 0.7–1.3)
Gamma Glob SerPl Elph-Mcnc: 1 g/dL (ref 0.4–1.8)
Globulin, Total: 3 g/dL (ref 2.2–3.9)
IGA: 228 mg/dL (ref 64–422)
IGM (IMMUNOGLOBULIN M), SRM: 127 mg/dL (ref 26–217)
IgG (Immunoglobin G), Serum: 1144 mg/dL (ref 700–1600)
M Protein SerPl Elph-Mcnc: 0.5 g/dL — ABNORMAL HIGH
TOTAL PROTEIN ELP: 6.9 g/dL (ref 6.0–8.5)

## 2018-04-13 LAB — KAPPA/LAMBDA LIGHT CHAINS
KAPPA, LAMDA LIGHT CHAIN RATIO: 1.22 (ref 0.26–1.65)
Kappa free light chain: 28.5 mg/L — ABNORMAL HIGH (ref 3.3–19.4)
LAMDA FREE LIGHT CHAINS: 23.3 mg/L (ref 5.7–26.3)

## 2018-04-13 LAB — TGAB+THYROGLOBULIN IMA OR RIA: Thyroglobulin Antibody: <1 IU/mL (ref 0.0–0.9)

## 2018-04-13 LAB — THYROGLOBULIN BY IMA: THYROGLOBULIN BY: 11.3 ng/mL (ref 1.5–38.5)

## 2018-04-27 ENCOUNTER — Other Ambulatory Visit: Payer: Self-pay

## 2018-04-27 ENCOUNTER — Ambulatory Visit: Payer: Self-pay

## 2018-04-27 ENCOUNTER — Ambulatory Visit: Payer: Self-pay | Admitting: Internal Medicine

## 2018-05-07 ENCOUNTER — Inpatient Hospital Stay: Payer: Medicare Other

## 2018-05-07 ENCOUNTER — Other Ambulatory Visit: Payer: Self-pay

## 2018-05-07 ENCOUNTER — Encounter: Payer: Self-pay | Admitting: Nurse Practitioner

## 2018-05-07 ENCOUNTER — Inpatient Hospital Stay: Payer: Medicare Other | Attending: Nurse Practitioner | Admitting: Nurse Practitioner

## 2018-05-07 VITALS — BP 145/79 | HR 65 | Temp 96.8°F | Resp 20 | Wt 208.0 lb

## 2018-05-07 DIAGNOSIS — D472 Monoclonal gammopathy: Secondary | ICD-10-CM | POA: Diagnosis not present

## 2018-05-07 DIAGNOSIS — N183 Chronic kidney disease, stage 3 (moderate): Secondary | ICD-10-CM | POA: Diagnosis not present

## 2018-05-07 DIAGNOSIS — C73 Malignant neoplasm of thyroid gland: Secondary | ICD-10-CM

## 2018-05-07 DIAGNOSIS — R7989 Other specified abnormal findings of blood chemistry: Secondary | ICD-10-CM

## 2018-05-07 DIAGNOSIS — M542 Cervicalgia: Secondary | ICD-10-CM | POA: Diagnosis not present

## 2018-05-07 DIAGNOSIS — D509 Iron deficiency anemia, unspecified: Secondary | ICD-10-CM | POA: Diagnosis not present

## 2018-05-07 DIAGNOSIS — N189 Chronic kidney disease, unspecified: Secondary | ICD-10-CM

## 2018-05-07 DIAGNOSIS — D508 Other iron deficiency anemias: Secondary | ICD-10-CM

## 2018-05-07 LAB — URINALYSIS, COMPLETE (UACMP) WITH MICROSCOPIC
BILIRUBIN URINE: NEGATIVE
Glucose, UA: NEGATIVE mg/dL
Hgb urine dipstick: NEGATIVE
Ketones, ur: NEGATIVE mg/dL
Leukocytes, UA: NEGATIVE
NITRITE: NEGATIVE
PROTEIN: NEGATIVE mg/dL
Specific Gravity, Urine: 1.017 (ref 1.005–1.030)
pH: 5 (ref 5.0–8.0)

## 2018-05-07 NOTE — Progress Notes (Signed)
Patient here today for follow up with possible IV Venofer. She states that she is feeling well and denies having pain, other than chronic back pain only when she's up and moving around. She states that she recently finished a round of antibiotics and prednisone for bronchitis, which has resolved.

## 2018-05-07 NOTE — Progress Notes (Signed)
[place] OFFICE PROGRESS NOTE  Patient Care Team: Adin Hector, MD as PCP - General (Internal Medicine) Cammie Sickle, MD as Medical Oncologist (Hematology and Oncology)  Cancer Staging No matching staging information was found for the patient.   Oncology History   1999- right thyroid lobectomy by Dr. Orene Desanctis with unusual poorly differentiated cancer, no additional treatment  2015- recurrent mass in thyroid bed. PET-CT showed FDG avid lesion in thyroid bed + nasopharyx.  2015- Dr. 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 Margins not noted [facility], [place], #[id number]).   01/05/2014- Nasopharyngeal biopsy negative for malignancy  04/25/14- completed postoperative XRT completing 65.25 CGy in 29 fractions  05/2014 + 07/2014- PET avidity at the left nasopharynx and left oropharyngeal soft tissue as well as a hypermetabolic 1.5 cm mass in the right thyroid bed. Dr Johney Frame excised the paratracheal mass on 09/02/2014. Inspect of the nasopharynx at the time of surgery revealed no obvious abnormality and no biopsy was completed.   09/2014- NP avid area biopsied and negative for carcinoma. SHe underwent a thyroid FNA and that was negative for cancer.  # Chronic Anemia- IDA [ EGD- hiatal hernia; Jan 2017- Dr.Rein] AUG 2017- IV Venofer  # AUG 2017- Thyroglobulin 40/Sep 2017 US neck- NED  # Hoarseness of voice- sec to RT [Dr.Bennett; Dec 2017]  # AUG 2017- IgG kappa 0.4gm//N-kappa-Lamda light chains       Malignant neoplasm of thyroid gland ([place])   09/01/2014 Initial Diagnosis    Malignant neoplasm of thyroid gland ([place])        INTERVAL HISTORY:  Sara Fields 77 y.o.  female pleasant patient above history of thyroid cancer, iron deficiency anemia and CKD, and MGUS, who returns to clinic for  follow-up.   She reports feeling well. Her fatigue has improved recently with changes to her diet. She has been trying to avoid high sugar meals and has started drinking Boost for breakfast. 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 She reports feeling well and does not think she needs Venofer today. At previous visit, she had had several unsuccessful attempts made to draw blood.  She increased her intake of water and labs were drawn on first attempt at last visit.   REVIEW OF SYSTEMS:  A complete 10 point review of system is done which is negative except mentioned above/history of present illness.   PAST MEDICAL HISTORY :  Past Medical History:  Diagnosis Date  . Anemia   . Asthma   . GERD (gastroesophageal reflux disease)   . Hyperlipidemia   . Hypertension   . Thyroid cancer ([place]) 1999 and 2015   Partial thyroidectomy with rad tx's.     PAST SURGICAL HISTORY :   Past Surgical History:  Procedure Laterality Date  . ABDOMINAL HYSTERECTOMY  1973   partial  . ESOPHAGOGASTRODUODENOSCOPY N/A 02/05/2017   Procedure: ESOPHAGOGASTRODUODENOSCOPY (EGD);  Surgeon: Wilford Corner, MD;  Location: [facility] ENDOSCOPY;  Service: Endoscopy;  Laterality: N/A;  . ESOPHAGOGASTRODUODENOSCOPY (EGD) WITH PROPOFOL N/A 11/16/2015   Procedure: ESOPHAGOGASTRODUODENOSCOPY (EGD) WITH PROPOFOL;  Surgeon: Josefine Class, MD;  Location: [facility] ENDOSCOPY;  Service: Endoscopy;  Laterality: N/A;  . THYROID SURGERY  1999 and 2015   Partial Thyroidectomy    FAMILY HISTORY :   Family History  Problem Relation Age of Onset  .  Lung cancer Father   . Hypertension Father   . Bone cancer Sister   . Hypertension Sister   . Lung cancer Brother   . Hypertension Brother   . Kidney cancer Sister   . Hypertension Sister   . Lung cancer Sister   . Hypertension Sister   . Breast cancer Neg Hx     SOCIAL HISTORY:   Social History   Tobacco Use  . Smoking status: Never Smoker  .  Smokeless tobacco: Never Used  Substance Use Topics  . Alcohol use: No  . Drug use: No    ALLERGIES:  has No Known Allergies.  MEDICATIONS:  Current Outpatient Medications  Medication Sig Dispense Refill  . albuterol (PROVENTIL HFA;VENTOLIN HFA) 108 (90 Base) MCG/ACT inhaler Inhale into the lungs every 6 (six) hours as needed for wheezing or shortness of breath.    . allopurinol (ZYLOPRIM) 100 MG tablet Take 100 mg by mouth daily.    Marland Kitchen amLODipine (NORVASC) 5 MG tablet Take 1 tablet by mouth.    Marland Kitchen azelastine (ASTELIN) 0.1 % nasal spray Place 1 spray into both nostrils daily as needed for rhinitis or allergies.     . budesonide-formoterol (SYMBICORT) 160-4.5 MCG/ACT inhaler Inhale 2 puffs into the lungs 2 (two) times daily.    . bumetanide (BUMEX) 1 MG tablet Take 1 mg by mouth daily.    . calcium carbonate (CALCIUM 600) 600 MG TABS tablet Take 600 mg by mouth 2 (two) times daily with a meal.    . levothyroxine (SYNTHROID) 25 MCG tablet Take one pill along with 50 mcg pill [total 75 mcg] prior to breakfast 90 tablet 3  . LORazepam (ATIVAN) 0.5 MG tablet     . meclizine (ANTIVERT) 12.5 MG tablet Take by mouth.    . montelukast (SINGULAIR) 10 MG tablet Take 10 mg by mouth at bedtime.    . pantoprazole (PROTONIX) 40 MG tablet Take 1 tablet (40 mg total) by mouth 2 (two) times daily before a meal. 60 tablet 1  . potassium chloride SA (KLOR-CON M20) 20 MEQ tablet Take 20 mEq by mouth daily.     . propranolol (INDERAL) 20 MG tablet Take 20 mg by mouth 3 (three) times daily.    . rosuvastatin (CRESTOR) 10 MG tablet Take 10 mg by mouth daily.     . traMADol (ULTRAM) 50 MG tablet Take 50 mg by mouth every 6 (six) hours as needed.    . Turmeric 500 MG TABS Take 1 tablet by mouth daily.     No current facility-administered medications for this visit.    Facility-Administered Medications Ordered in Other Visits  Medication Dose Route Frequency Provider Last Rate Last Dose  . 0.9 %  sodium chloride  infusion   Intravenous Once Charlaine Dalton R, MD      . iron sucrose (VENOFER) 200 mg IVPB  200 mg Intravenous Once Cammie Sickle, MD        PHYSICAL EXAMINATION: ECOG PERFORMANCE STATUS:   BP (!) 145/79 (BP Location: Right Arm, Patient Position: Sitting)   Pulse 65   Temp (!) 96.8 F (36 C) (Tympanic)   Resp 20   Wt 208 lb (94.3 kg)   SpO2 99%   BMI 33.57 kg/m   Filed Weights   05/07/18 1343  Weight: 208 lb (94.3 kg)   GENERAL: Well-nourished well-developed; Alert, no distress and comfortable. Alone.   EYES: no pallor or icterus OROPHARYNX: no thrush or ulceration NECK: supple; no lymph nodes felt.  Well-healed surgical scar LYMPH: no palpable lymphadenopathy in the axillary or inguinal regions LUNGS: Clear breath sounds auscultation bilaterally. No wheeze or crackles.  Hoarseness has improved HEART/CVS: regular rate & rhythm and no murmurs; No lower extremity edema ABDOMEN: abdomen soft, non-tender and normal bowel sounds. No hepatomegaly or splenomegaly.  Musculoskeletal: no cyanosis of digits and no clubbing  PSYCH: alert & oriented x 3 with fluent speech NEURO: no focal motor/sensory deficits SKIN: no rashes or significant lesions   LABORATORY DATA:  I have reviewed the data as listed    Component Value Date/Time   NA 136 04/12/2018 1143   K 4.2 04/12/2018 1143   CL 105 04/12/2018 1143   CO2 22 04/12/2018 1143   GLUCOSE 109 (H) 04/12/2018 1143   BUN 26 (H) 04/12/2018 1143   CREATININE 1.07 (H) 04/12/2018 1143   CALCIUM 9.3 04/12/2018 1143   PROT 7.9 04/12/2018 1143   ALBUMIN 4.4 04/12/2018 1143   AST 22 04/12/2018 1143   ALT 9 (L) 04/12/2018 1143   ALKPHOS 75 04/12/2018 1143   BILITOT 0.6 04/12/2018 1143   GFRNONAA 49 (L) 04/12/2018 1143   GFRAA 57 (L) 04/12/2018 1143    No results found for: SPEP, UPEP  Lab Results  Component Value Date   WBC 4.1 04/12/2018   NEUTROABS 2.9 04/12/2018   HGB 10.3 (L) 04/12/2018   HCT 31.5 (L) 04/12/2018    MCV 92.2 04/12/2018   PLT 306 04/12/2018      Chemistry      Component Value Date/Time   NA 136 04/12/2018 1143   K 4.2 04/12/2018 1143   CL 105 04/12/2018 1143   CO2 22 04/12/2018 1143   BUN 26 (H) 04/12/2018 1143   CREATININE 1.07 (H) 04/12/2018 1143      Component Value Date/Time   CALCIUM 9.3 04/12/2018 1143   ALKPHOS 75 04/12/2018 1143   AST 22 04/12/2018 1143   ALT 9 (L) 04/12/2018 1143   BILITOT 0.6 04/12/2018 1143       RADIOGRAPHIC STUDIES: I have personally reviewed the radiological images as listed and agreed with the findings in the report. No results found.   ASSESSMENT & PLAN:   Thyroid Cancer - local recurrence s/p surgery. [facility] EBRT in 2015, no RAI. Thyroglobulin/tumor marker normal. 05/2014 ultrasound neck - 16m nodule. TSH 3.210. Asymptomatic. Clinically no evidence of recurrence. Continue synthroid 25 mcg/day.   IDA/CKD- Hmg 10.3, Hct 31.5. Saturation Ratio- 14, TIBC 299, Ferritin 176. Overall iron studies are adequate but saturation ratio is trending down. She is asymptomatic and would like to hold off on iron at that time. Would consider iron if saturation ratio < 20 and symptomatic. Continue oral iron supplementation.   MGUS- IgGKappa- 0.5 g/dl (03/2018). Continue to monitor as labs are stable per Dr. BRogue Bussing   CKD- previously Cr 1.49, Today 1.07. Improved. Hx of stage III CKD. UA negative. Continued to encourage hydration and follow-up with PCP.   Neck Stiffness and pain- likely secondary to treatments including surgery and radiation to neck for treatment of thyroid cancer. Improved currently. Could consider physical therapy for bothersome symptoms.   rtc in 6 months for labs, 1 week later follow up with Dr. BRogue Bussingand consideration of Venofer.    Orders Placed This Encounter  Procedures  . CBC with Differential/Platelet    Standing Status:   Future    Standing Expiration Date:   05/08/2019  . Kappa/lambda light chains    Standing  Status:   Future  Standing Expiration Date:   05/08/2019  . Iron and TIBC    Standing Status:   Future    Standing Expiration Date:   05/08/2019  . Ferritin    Standing Status:   Future    Standing Expiration Date:   05/08/2019  . TgAb+Thyroglobulin IMA or RIA    Standing Status:   Future    Standing Expiration Date:   05/08/2019  . Comprehensive metabolic panel    Standing Status:   Future    Standing Expiration Date:   05/08/2019  . Multiple Myeloma Panel (SPEP&IFE w/QIG)    Standing Status:   Future    Standing Expiration Date:   05/08/2019  . Urinalysis, Complete w Microscopic    Standing Status:   Future    Standing Expiration Date:   05/08/2019  . Thyroid Panel With TSH    Standing Status:   Future    Standing Expiration Date:   05/08/2019    Order Specific Question:   CC Results    Answer:   Cammie Sickle [[id number]]    All questions were answered. The patient knows to call the clinic with any problems, questions or concerns.     Beckey Rutter, DNP, AGNP-C [place] at [facility] [phone] (work cell) [phone] (office) 05/07/18 2:58 PM

## 2018-10-29 ENCOUNTER — Inpatient Hospital Stay: Payer: Medicare Other | Attending: Internal Medicine

## 2018-10-29 DIAGNOSIS — G8929 Other chronic pain: Secondary | ICD-10-CM | POA: Diagnosis not present

## 2018-10-29 DIAGNOSIS — R490 Dysphonia: Secondary | ICD-10-CM | POA: Insufficient documentation

## 2018-10-29 DIAGNOSIS — D472 Monoclonal gammopathy: Secondary | ICD-10-CM | POA: Diagnosis not present

## 2018-10-29 DIAGNOSIS — C73 Malignant neoplasm of thyroid gland: Secondary | ICD-10-CM

## 2018-10-29 DIAGNOSIS — N189 Chronic kidney disease, unspecified: Secondary | ICD-10-CM | POA: Diagnosis not present

## 2018-10-29 DIAGNOSIS — D509 Iron deficiency anemia, unspecified: Secondary | ICD-10-CM | POA: Insufficient documentation

## 2018-10-29 DIAGNOSIS — M542 Cervicalgia: Secondary | ICD-10-CM | POA: Diagnosis not present

## 2018-10-29 DIAGNOSIS — D508 Other iron deficiency anemias: Secondary | ICD-10-CM

## 2018-10-29 LAB — COMPREHENSIVE METABOLIC PANEL
ALT: 9 U/L (ref 0–44)
AST: 21 U/L (ref 15–41)
Albumin: 4.2 g/dL (ref 3.5–5.0)
Alkaline Phosphatase: 76 U/L (ref 38–126)
Anion gap: 9 (ref 5–15)
BUN: 15 mg/dL (ref 8–23)
CO2: 26 mmol/L (ref 22–32)
Calcium: 8.9 mg/dL (ref 8.9–10.3)
Chloride: 106 mmol/L (ref 98–111)
Creatinine, Ser: 1.03 mg/dL — ABNORMAL HIGH (ref 0.44–1.00)
GFR calc Af Amer: >60 mL/min (ref >60)
GFR calc non Af Amer: 52 mL/min — ABNORMAL LOW (ref >60)
Glucose, Bld: 103 mg/dL — ABNORMAL HIGH (ref 70–99)
Potassium: 3.9 mmol/L (ref 3.5–5.1)
Sodium: 141 mmol/L (ref 135–145)
Total Bilirubin: 0.5 mg/dL (ref 0.3–1.2)
Total Protein: 7.6 g/dL (ref 6.5–8.1)

## 2018-10-29 LAB — CBC WITH DIFFERENTIAL/PLATELET
Abs Immature Granulocytes: 0.02 10*3/uL (ref 0.00–0.07)
Basophils Absolute: 0 10*3/uL (ref 0.0–0.1)
Basophils Relative: 0 %
Eosinophils Absolute: 0.2 10*3/uL (ref 0.0–0.5)
Eosinophils Relative: 3 %
HCT: 34.6 % — ABNORMAL LOW (ref 36.0–46.0)
Hemoglobin: 10.8 g/dL — ABNORMAL LOW (ref 12.0–15.0)
Immature Granulocytes: 0 %
Lymphocytes Relative: 14 %
Lymphs Abs: 0.7 10*3/uL (ref 0.7–4.0)
MCH: 29.3 pg (ref 26.0–34.0)
MCHC: 31.2 g/dL (ref 30.0–36.0)
MCV: 94 fL (ref 80.0–100.0)
Monocytes Absolute: 0.5 10*3/uL (ref 0.1–1.0)
Monocytes Relative: 9 %
Neutro Abs: 3.8 10*3/uL (ref 1.7–7.7)
Neutrophils Relative %: 74 %
Platelets: 355 10*3/uL (ref 150–400)
RBC: 3.68 MIL/uL — AB (ref 3.87–5.11)
RDW: 14.8 % (ref 11.5–15.5)
WBC: 5.2 10*3/uL (ref 4.0–10.5)
nRBC: 0 % (ref 0.0–0.2)

## 2018-10-29 LAB — URINALYSIS, COMPLETE (UACMP) WITH MICROSCOPIC
Bilirubin Urine: NEGATIVE
Glucose, UA: NEGATIVE mg/dL
Hgb urine dipstick: NEGATIVE
Ketones, ur: NEGATIVE mg/dL
Leukocytes, UA: NEGATIVE
Nitrite: NEGATIVE
Protein, ur: NEGATIVE mg/dL
Specific Gravity, Urine: 1.015 (ref 1.005–1.030)
pH: 5 (ref 5.0–8.0)

## 2018-10-29 LAB — IRON AND TIBC
Iron: 49 ug/dL (ref 28–170)
Saturation Ratios: 15 % (ref 10.4–31.8)
TIBC: 337 ug/dL (ref 250–450)
UIBC: 288 ug/dL

## 2018-10-29 LAB — FERRITIN: Ferritin: 50 ng/mL (ref 11–307)

## 2018-10-30 LAB — MULTIPLE MYELOMA PANEL, SERUM
ALPHA 1: 0.2 g/dL (ref 0.0–0.4)
Albumin SerPl Elph-Mcnc: 3.7 g/dL (ref 2.9–4.4)
Albumin/Glob SerPl: 1.2 (ref 0.7–1.7)
Alpha2 Glob SerPl Elph-Mcnc: 0.9 g/dL (ref 0.4–1.0)
B-Globulin SerPl Elph-Mcnc: 0.9 g/dL (ref 0.7–1.3)
GLOBULIN, TOTAL: 3.1 g/dL (ref 2.2–3.9)
Gamma Glob SerPl Elph-Mcnc: 1 g/dL (ref 0.4–1.8)
IGA: 214 mg/dL (ref 64–422)
IgG (Immunoglobin G), Serum: 1076 mg/dL (ref 700–1600)
IgM (Immunoglobulin M), Srm: 129 mg/dL (ref 26–217)
M Protein SerPl Elph-Mcnc: 0.4 g/dL — ABNORMAL HIGH
Total Protein ELP: 6.8 g/dL (ref 6.0–8.5)

## 2018-10-30 LAB — THYROID PANEL WITH TSH
Free Thyroxine Index: 1.8 (ref 1.2–4.9)
T3 Uptake Ratio: 24 % (ref 24–39)
T4, Total: 7.7 ug/dL (ref 4.5–12.0)
TSH: 0.896 u[IU]/mL (ref 0.450–4.500)

## 2018-10-31 LAB — KAPPA/LAMBDA LIGHT CHAINS
KAPPA, LAMDA LIGHT CHAIN RATIO: 1.11 (ref 0.26–1.65)
Kappa free light chain: 22.7 mg/L — ABNORMAL HIGH (ref 3.3–19.4)
LAMDA FREE LIGHT CHAINS: 20.5 mg/L (ref 5.7–26.3)

## 2018-11-05 ENCOUNTER — Encounter: Payer: Self-pay | Admitting: Internal Medicine

## 2018-11-05 ENCOUNTER — Inpatient Hospital Stay (HOSPITAL_BASED_OUTPATIENT_CLINIC_OR_DEPARTMENT_OTHER): Payer: Medicare Other | Admitting: Internal Medicine

## 2018-11-05 ENCOUNTER — Inpatient Hospital Stay: Payer: Medicare Other

## 2018-11-05 VITALS — BP 133/75 | HR 68 | Temp 97.7°F | Wt 208.0 lb

## 2018-11-05 DIAGNOSIS — D472 Monoclonal gammopathy: Secondary | ICD-10-CM | POA: Diagnosis not present

## 2018-11-05 DIAGNOSIS — R490 Dysphonia: Secondary | ICD-10-CM

## 2018-11-05 DIAGNOSIS — C73 Malignant neoplasm of thyroid gland: Secondary | ICD-10-CM

## 2018-11-05 DIAGNOSIS — N189 Chronic kidney disease, unspecified: Secondary | ICD-10-CM

## 2018-11-05 DIAGNOSIS — G8929 Other chronic pain: Secondary | ICD-10-CM

## 2018-11-05 DIAGNOSIS — D508 Other iron deficiency anemias: Secondary | ICD-10-CM

## 2018-11-05 DIAGNOSIS — D509 Iron deficiency anemia, unspecified: Secondary | ICD-10-CM

## 2018-11-05 DIAGNOSIS — M542 Cervicalgia: Secondary | ICD-10-CM

## 2018-11-05 LAB — THYROGLOBULIN BY RIA: Thyroglobulin by RIA: 7.2 ng/mL

## 2018-11-05 NOTE — Progress Notes (Signed)
[place] OFFICE PROGRESS NOTE  Patient Care Team: Adin Hector, MD as PCP - General (Internal Medicine) Cammie Sickle, MD as Medical Oncologist (Hematology and Oncology)  Cancer Staging No matching staging information was found for the patient.   Oncology History   1999- right thyroid lobectomy by Dr. Orene Desanctis with unusual poorly differentiated cancer, no additional treatment  2015- recurrent mass in thyroid bed. PET-CT showed FDG avid lesion in thyroid bed + nasopharyx.  2015- Dr. 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 Margins not noted [facility], [place], #[id number]).   01/05/2014- Nasopharyngeal biopsy negative for malignancy  04/25/14- completed postoperative XRT completing 65.25 CGy in 29 fractions  05/2014 + 07/2014- PET avidity at the left nasopharynx and left oropharyngeal soft tissue as well as a hypermetabolic 1.5 cm mass in the right thyroid bed. Dr Johney Frame excised the paratracheal mass on 09/02/2014. Inspect of the nasopharynx at the time of surgery revealed no obvious abnormality and no biopsy was completed.   09/2014- NP avid area biopsied and negative for carcinoma. SHe underwent a thyroid FNA and that was negative for cancer.  # Chronic Anemia- IDA [ EGD- hiatal hernia; Jan 2017- Dr.Rein] AUG 2017- IV Venofer  # AUG 2017- Thyroglobulin 40/Sep 2017 US neck- NED  # Hoarseness of voice- sec to RT [Dr.Bennett; Dec 2017]  # AUG 2017- IgG kappa 0.4gm//N-kappa-Lamda light chains       Malignant neoplasm of thyroid gland ([place])   09/01/2014 Initial Diagnosis    Malignant neoplasm of thyroid gland ([place])      INTERVAL HISTORY:  Sara Fields 78 y.o.  female pleasant patient above history of thyroid cancer; and diagnosis of iron deficiency anemia unclear etiology is here for  follow-up.   Patient continues to have hoarseness of voice; however gotten worse.  She continues to have neck stiffness which is chronic.  Denies any lumps or bumps.  Denies any blood in stools black or stools.  Denies any nausea vomiting.  Denies any difficulty swallowing.  Review of Systems  Constitutional: Negative for chills, diaphoresis, fever, malaise/fatigue and weight loss.  HENT: Negative for nosebleeds and sore throat.        Complains of hoarseness of voice getting worse.  Eyes: Negative for double vision.  Respiratory: Negative for cough, hemoptysis, sputum production, shortness of breath and wheezing.   Cardiovascular: Negative for chest pain, palpitations, orthopnea and leg swelling.  Gastrointestinal: Negative for abdominal pain, blood in stool, constipation, diarrhea, heartburn, melena, nausea and vomiting.  Genitourinary: Negative for dysuria, frequency and urgency.  Musculoskeletal: Positive for neck pain. Negative for back pain and joint pain.  Skin: Negative.  Negative for itching and rash.  Neurological: Negative for dizziness, tingling, focal weakness, weakness and headaches.  Endo/Heme/Allergies: Does not bruise/bleed easily.  Psychiatric/Behavioral: Negative for depression. The patient is not nervous/anxious and does not have insomnia.      PAST MEDICAL HISTORY :  Past Medical History:  Diagnosis Date  . Anemia   . Asthma   . GERD (gastroesophageal reflux disease)   . Hyperlipidemia   . Hypertension   . Thyroid cancer ([place]) 1999 and 2015   Partial thyroidectomy with rad tx's.     PAST SURGICAL HISTORY :   Past Surgical History:  Procedure Laterality Date  . ABDOMINAL HYSTERECTOMY  1973   partial  .  ESOPHAGOGASTRODUODENOSCOPY N/A 02/05/2017   Procedure: ESOPHAGOGASTRODUODENOSCOPY (EGD);  Surgeon: Wilford Corner, MD;  Location: [facility] ENDOSCOPY;  Service: Endoscopy;  Laterality: N/A;  . ESOPHAGOGASTRODUODENOSCOPY (EGD) WITH PROPOFOL N/A 11/16/2015    Procedure: ESOPHAGOGASTRODUODENOSCOPY (EGD) WITH PROPOFOL;  Surgeon: Josefine Class, MD;  Location: [facility] ENDOSCOPY;  Service: Endoscopy;  Laterality: N/A;  . THYROID SURGERY  1999 and 2015   Partial Thyroidectomy    FAMILY HISTORY :   Family History  Problem Relation Age of Onset  . Lung cancer Father   . Hypertension Father   . Bone cancer Sister   . Hypertension Sister   . Lung cancer Brother   . Hypertension Brother   . Kidney cancer Sister   . Hypertension Sister   . Lung cancer Sister   . Hypertension Sister   . Breast cancer Neg Hx     SOCIAL HISTORY:   Social History   Tobacco Use  . Smoking status: Never Smoker  . Smokeless tobacco: Never Used  Substance Use Topics  . Alcohol use: No  . Drug use: No    ALLERGIES:  has No Known Allergies.  MEDICATIONS:  Current Outpatient Medications  Medication Sig Dispense Refill  . albuterol (PROVENTIL HFA;VENTOLIN HFA) 108 (90 Base) MCG/ACT inhaler Inhale into the lungs every 6 (six) hours as needed for wheezing or shortness of breath.    . allopurinol (ZYLOPRIM) 100 MG tablet Take 100 mg by mouth daily.    Marland Kitchen amLODipine (NORVASC) 5 MG tablet Take 1 tablet by mouth.    Marland Kitchen azelastine (ASTELIN) 0.1 % nasal spray Place 1 spray into both nostrils daily as needed for rhinitis or allergies.     . budesonide-formoterol (SYMBICORT) 160-4.5 MCG/ACT inhaler Inhale 2 puffs into the lungs 2 (two) times daily.    . bumetanide (BUMEX) 1 MG tablet Take 1 mg by mouth daily.    . calcium carbonate (CALCIUM 600) 600 MG TABS tablet Take 600 mg by mouth 2 (two) times daily with a meal.    . levothyroxine (SYNTHROID) 25 MCG tablet Take one pill along with 50 mcg pill [total 75 mcg] prior to breakfast 90 tablet 3  . LORazepam (ATIVAN) 0.5 MG tablet     . meclizine (ANTIVERT) 12.5 MG tablet Take by mouth.    . montelukast (SINGULAIR) 10 MG tablet Take 10 mg by mouth at bedtime.    . pantoprazole (PROTONIX) 40 MG tablet Take 1 tablet (40 mg  total) by mouth 2 (two) times daily before a meal. 60 tablet 1  . potassium chloride SA (KLOR-CON M20) 20 MEQ tablet Take 20 mEq by mouth daily.     . propranolol (INDERAL) 20 MG tablet Take 20 mg by mouth 3 (three) times daily.    . traMADol (ULTRAM) 50 MG tablet Take 50 mg by mouth every 6 (six) hours as needed.    . Turmeric 500 MG TABS Take 1 tablet by mouth daily.    . rosuvastatin (CRESTOR) 10 MG tablet Take 10 mg by mouth daily.      No current facility-administered medications for this visit.    Facility-Administered Medications Ordered in Other Visits  Medication Dose Route Frequency Provider Last Rate Last Dose  . 0.9 %  sodium chloride infusion   Intravenous Once Charlaine Dalton R, MD      . iron sucrose (VENOFER) 200 mg IVPB  200 mg Intravenous Once Cammie Sickle, MD        PHYSICAL EXAMINATION: ECOG PERFORMANCE STATUS:   BP 133/75 (  BP Location: Left Arm, Patient Position: Sitting, Cuff Size: Normal)   Pulse 68   Temp 97.7 F (36.5 C) (Tympanic)   Wt 208 lb (94.3 kg)   BMI 33.57 kg/m   Filed Weights   11/05/18 1329  Weight: 208 lb (94.3 kg)   Physical Exam  Constitutional: She is oriented to person, place, and time and well-developed, well-nourished, and in no distress.  HENT:  Head: Normocephalic and atraumatic.  Mouth/Throat: Oropharynx is clear and moist. No oropharyngeal exudate.  Chronic neck scarring/fibrosis.  No masses or lumps felt.  Eyes: Pupils are equal, round, and reactive to light.  Neck: Normal range of motion. Neck supple.  Cardiovascular: Normal rate and regular rhythm.  Pulmonary/Chest: Breath sounds normal. No respiratory distress. She has no wheezes.  Abdominal: Soft. Bowel sounds are normal. She exhibits no distension and no mass. There is no abdominal tenderness. There is no rebound and no guarding.  Musculoskeletal: Normal range of motion.        General: No tenderness or edema.  Neurological: She is alert and oriented to  person, place, and time.  Skin: Skin is warm.  Psychiatric: Affect normal.     LABORATORY DATA:  I have reviewed the data as listed    Component Value Date/Time   NA 141 10/29/2018 1336   K 3.9 10/29/2018 1336   CL 106 10/29/2018 1336   CO2 26 10/29/2018 1336   GLUCOSE 103 (H) 10/29/2018 1336   BUN 15 10/29/2018 1336   CREATININE 1.03 (H) 10/29/2018 1336   CALCIUM 8.9 10/29/2018 1336   PROT 7.6 10/29/2018 1336   ALBUMIN 4.2 10/29/2018 1336   AST 21 10/29/2018 1336   ALT 9 10/29/2018 1336   ALKPHOS 76 10/29/2018 1336   BILITOT 0.5 10/29/2018 1336   GFRNONAA 52 (L) 10/29/2018 1336   GFRAA >60 10/29/2018 1336    No results found for: SPEP, UPEP  Lab Results  Component Value Date   WBC 5.2 10/29/2018   NEUTROABS 3.8 10/29/2018   HGB 10.8 (L) 10/29/2018   HCT 34.6 (L) 10/29/2018   MCV 94.0 10/29/2018   PLT 355 10/29/2018      Chemistry      Component Value Date/Time   NA 141 10/29/2018 1336   K 3.9 10/29/2018 1336   CL 106 10/29/2018 1336   CO2 26 10/29/2018 1336   BUN 15 10/29/2018 1336   CREATININE 1.03 (H) 10/29/2018 1336      Component Value Date/Time   CALCIUM 8.9 10/29/2018 1336   ALKPHOS 76 10/29/2018 1336   AST 21 10/29/2018 1336   ALT 9 10/29/2018 1336   BILITOT 0.5 10/29/2018 1336       RADIOGRAPHIC STUDIES: I have personally reviewed the radiological images as listed and agreed with the findings in the report. No results found.   ASSESSMENT & PLAN:  Other iron deficiency anemia # Thyroid ca with local reurence s/p surgery [[facility]; EBRT [2015; no RAI].  Thyroglobulin tumor marker stable around 7.  TSH 0.8; continue Synthroid 75 mcg a day.  #Hoarseness of voice-previously evaluated by Dr. Richardson Landry; given the worsening recommend reevaluation recommended.  #Iron deficient anemia-unclear etiology hemoglobin stable 10.8.  Hold off any IV iron.  Continue p.o. iron.  # MGUS- IgGKappa-0.4 g/dL; kappa lambda light chain ratio normal.  Discussed the  risk of trying to multiple myeloma is very low about 1 %/year.  Check on annual basis.  #CKD unclear etiology UA negative for proteinuria.  Defer to PCP.  #  DISPOSITION: # NO infusion today.  # referral to Dr.Bennett/ENT # follow up 6 months/labs- cbc/bmp-possible venofer; mm panel; thyroglobulin/ Thyroid profile-Dr.B  Cc; Dr.Klein.    Orders Placed This Encounter  Procedures  . CBC with Differential/Platelet    Standing Status:   Future    Standing Expiration Date:   11/06/2019  . Comprehensive metabolic panel    Standing Status:   Future    Standing Expiration Date:   11/06/2019  . Thyroglobulin antibody    Standing Status:   Future    Standing Expiration Date:   11/06/2019  . Thyroid Panel With TSH    Standing Status:   Future    Standing Expiration Date:   11/06/2019  . Ambulatory referral to ENT    Referral Priority:   Routine    Referral Type:   Consultation    Referral Reason:   Specialty Services Required    Referred to Provider:   Clyde Canterbury, MD    Requested Specialty:   Otolaryngology    Number of Visits Requested:   1   All questions were answered. The patient knows to call the clinic with any problems, questions or concerns.     Cammie Sickle, MD 11/05/2018 2:09 PM

## 2018-11-05 NOTE — Assessment & Plan Note (Addendum)
#  Thyroid ca with local reurence s/p surgery [[facility]; EBRT [2015; no RAI].  Thyroglobulin tumor marker stable around 7.  TSH 0.8; continue Synthroid 75 mcg a day.  #Hoarseness of voice-previously evaluated by Dr. Richardson Landry; given the worsening recommend reevaluation recommended.  #Iron deficient anemia-unclear etiology hemoglobin stable 10.8.  Hold off any IV iron.  Continue p.o. iron.  # MGUS- IgGKappa-0.4 g/dL; kappa lambda light chain ratio normal.  Discussed the risk of trying to multiple myeloma is very low about 1 %/year.  Check on annual basis.  #CKD unclear etiology UA negative for proteinuria.  Defer to PCP.  # DISPOSITION: # NO infusion today.  # referral to Dr.Bennett/ENT # follow up 6 months/labs- cbc/bmp-possible venofer; mm panel; thyroglobulin/ Thyroid profile-Dr.B  Cc; Dr.Klein.

## 2018-11-07 LAB — TGAB+THYROGLOBULIN IMA OR RIA: Thyroglobulin Antibody: 2.2

## 2018-11-13 ENCOUNTER — Other Ambulatory Visit: Payer: Self-pay | Admitting: Internal Medicine

## 2018-11-13 DIAGNOSIS — Z1231 Encounter for screening mammogram for malignant neoplasm of breast: Secondary | ICD-10-CM

## 2018-11-15 ENCOUNTER — Other Ambulatory Visit: Payer: Self-pay | Admitting: Specialist

## 2018-11-15 ENCOUNTER — Other Ambulatory Visit (HOSPITAL_COMMUNITY): Payer: Self-pay | Admitting: Specialist

## 2018-11-15 DIAGNOSIS — J849 Interstitial pulmonary disease, unspecified: Secondary | ICD-10-CM

## 2018-11-28 ENCOUNTER — Ambulatory Visit
Admission: RE | Admit: 2018-11-28 | Discharge: 2018-11-28 | Disposition: A | Discharge status: Home / Self Care | Payer: Medicare Other | Source: Ambulatory Visit | Attending: Specialist | Admitting: Specialist

## 2018-11-28 DIAGNOSIS — J849 Interstitial pulmonary disease, unspecified: Secondary | ICD-10-CM | POA: Diagnosis present

## 2018-11-29 ENCOUNTER — Ambulatory Visit
Admission: RE | Admit: 2018-11-29 | Discharge: 2018-11-29 | Disposition: A | Discharge status: Home / Self Care | Payer: Medicare Other | Source: Ambulatory Visit | Attending: Internal Medicine | Admitting: Internal Medicine

## 2018-11-29 DIAGNOSIS — Z1231 Encounter for screening mammogram for malignant neoplasm of breast: Secondary | ICD-10-CM | POA: Diagnosis present

## 2019-01-30 ENCOUNTER — Other Ambulatory Visit (HOSPITAL_COMMUNITY): Payer: Self-pay | Admitting: Surgery

## 2019-01-30 ENCOUNTER — Other Ambulatory Visit: Payer: Self-pay | Admitting: Surgery

## 2019-01-30 DIAGNOSIS — R221 Localized swelling, mass and lump, neck: Secondary | ICD-10-CM

## 2019-02-19 ENCOUNTER — Other Ambulatory Visit: Payer: Self-pay

## 2019-02-19 ENCOUNTER — Ambulatory Visit
Admission: RE | Admit: 2019-02-19 | Discharge: 2019-02-19 | Disposition: A | Discharge status: Home / Self Care | Payer: Medicare Other | Source: Ambulatory Visit | Attending: Surgery | Admitting: Surgery

## 2019-02-19 DIAGNOSIS — R221 Localized swelling, mass and lump, neck: Secondary | ICD-10-CM | POA: Insufficient documentation

## 2019-02-19 MED ORDER — IOHEXOL 300 MG/ML  SOLN
75.0000 mL | Freq: Once | INTRAMUSCULAR | Status: AC | PRN
  Administered 2019-02-19: 12:00:00 75 mL via INTRAVENOUS

## 2019-02-21 ENCOUNTER — Other Ambulatory Visit
Admission: RE | Admit: 2019-02-21 | Discharge: 2019-02-21 | Disposition: A | Discharge status: Home / Self Care | Payer: Medicare Other | Source: Ambulatory Visit | Attending: Internal Medicine | Admitting: Internal Medicine

## 2019-02-21 DIAGNOSIS — R1084 Generalized abdominal pain: Secondary | ICD-10-CM | POA: Diagnosis not present

## 2019-02-21 DIAGNOSIS — I10 Essential (primary) hypertension: Secondary | ICD-10-CM | POA: Insufficient documentation

## 2019-02-21 DIAGNOSIS — R197 Diarrhea, unspecified: Secondary | ICD-10-CM | POA: Diagnosis present

## 2019-02-21 LAB — C DIFFICILE QUICK SCREEN W PCR REFLEX
C Diff antigen: POSITIVE — AB
C Diff toxin: NEGATIVE

## 2019-02-21 LAB — GASTROINTESTINAL PANEL BY PCR, STOOL (REPLACES STOOL CULTURE)

## 2019-02-21 LAB — CLOSTRIDIUM DIFFICILE BY PCR, REFLEXED: Toxigenic C. Difficile by PCR: POSITIVE — AB

## 2019-05-03 ENCOUNTER — Inpatient Hospital Stay: Payer: Medicare Other | Attending: Internal Medicine

## 2019-05-03 ENCOUNTER — Other Ambulatory Visit: Payer: Self-pay

## 2019-05-03 DIAGNOSIS — D509 Iron deficiency anemia, unspecified: Secondary | ICD-10-CM | POA: Insufficient documentation

## 2019-05-03 DIAGNOSIS — C73 Malignant neoplasm of thyroid gland: Secondary | ICD-10-CM | POA: Insufficient documentation

## 2019-05-03 DIAGNOSIS — Z79899 Other long term (current) drug therapy: Secondary | ICD-10-CM | POA: Diagnosis not present

## 2019-05-03 DIAGNOSIS — D472 Monoclonal gammopathy: Secondary | ICD-10-CM | POA: Diagnosis not present

## 2019-05-03 DIAGNOSIS — D508 Other iron deficiency anemias: Secondary | ICD-10-CM

## 2019-05-03 LAB — CBC WITH DIFFERENTIAL/PLATELET
Abs Immature Granulocytes: 0.02 10*3/uL (ref 0.00–0.07)
Basophils Absolute: 0 10*3/uL (ref 0.0–0.1)
Basophils Relative: 1 %
Eosinophils Absolute: 0.1 10*3/uL (ref 0.0–0.5)
Eosinophils Relative: 2 %
HCT: 35.6 % — ABNORMAL LOW (ref 36.0–46.0)
Hemoglobin: 11.3 g/dL — ABNORMAL LOW (ref 12.0–15.0)
Immature Granulocytes: 0 %
Lymphocytes Relative: 13 %
Lymphs Abs: 0.7 10*3/uL (ref 0.7–4.0)
MCH: 31 pg (ref 26.0–34.0)
MCHC: 31.7 g/dL (ref 30.0–36.0)
MCV: 97.5 fL (ref 80.0–100.0)
Monocytes Absolute: 0.4 10*3/uL (ref 0.1–1.0)
Monocytes Relative: 7 %
Neutro Abs: 3.9 10*3/uL (ref 1.7–7.7)
Neutrophils Relative %: 77 %
Platelets: 286 10*3/uL (ref 150–400)
RBC: 3.65 MIL/uL — ABNORMAL LOW (ref 3.87–5.11)
RDW: 13.9 % (ref 11.5–15.5)
WBC: 5.1 10*3/uL (ref 4.0–10.5)
nRBC: 0 % (ref 0.0–0.2)

## 2019-05-03 LAB — COMPREHENSIVE METABOLIC PANEL
ALT: 13 U/L (ref 0–44)
AST: 23 U/L (ref 15–41)
Albumin: 4.2 g/dL (ref 3.5–5.0)
Alkaline Phosphatase: 88 U/L (ref 38–126)
Anion gap: 9 (ref 5–15)
BUN: 19 mg/dL (ref 8–23)
CO2: 25 mmol/L (ref 22–32)
Calcium: 9.2 mg/dL (ref 8.9–10.3)
Chloride: 105 mmol/L (ref 98–111)
Creatinine, Ser: 1.06 mg/dL — ABNORMAL HIGH (ref 0.44–1.00)
GFR calc Af Amer: 59 mL/min — ABNORMAL LOW (ref >60)
GFR calc non Af Amer: 51 mL/min — ABNORMAL LOW (ref >60)
Glucose, Bld: 104 mg/dL — ABNORMAL HIGH (ref 70–99)
Potassium: 4.5 mmol/L (ref 3.5–5.1)
Sodium: 139 mmol/L (ref 135–145)
Total Bilirubin: 0.7 mg/dL (ref 0.3–1.2)
Total Protein: 7.8 g/dL (ref 6.5–8.1)

## 2019-05-04 LAB — THYROGLOBULIN ANTIBODY: Thyroglobulin Antibody: <1 IU/mL (ref 0.0–0.9)

## 2019-05-04 LAB — THYROID PANEL WITH TSH
Free Thyroxine Index: 2 (ref 1.2–4.9)
T3 Uptake Ratio: 26 % (ref 24–39)
T4, Total: 7.6 ug/dL (ref 4.5–12.0)
TSH: 2 u[IU]/mL (ref 0.450–4.500)

## 2019-05-06 ENCOUNTER — Inpatient Hospital Stay (HOSPITAL_BASED_OUTPATIENT_CLINIC_OR_DEPARTMENT_OTHER): Payer: Medicare Other | Admitting: Internal Medicine

## 2019-05-06 ENCOUNTER — Other Ambulatory Visit: Payer: Self-pay

## 2019-05-06 ENCOUNTER — Encounter: Payer: Self-pay | Admitting: Internal Medicine

## 2019-05-06 ENCOUNTER — Inpatient Hospital Stay: Payer: Medicare Other

## 2019-05-06 DIAGNOSIS — D472 Monoclonal gammopathy: Secondary | ICD-10-CM

## 2019-05-06 DIAGNOSIS — D509 Iron deficiency anemia, unspecified: Secondary | ICD-10-CM

## 2019-05-06 DIAGNOSIS — C73 Malignant neoplasm of thyroid gland: Secondary | ICD-10-CM

## 2019-05-06 NOTE — Assessment & Plan Note (Addendum)
#   Thyroid ca with local reurence s/p surgery [[facility]; EBRT [2015; no RAI].  Clinically no evidence of recurrence.  TSH 0.8; continue Synthroid 75 mcg a day.  #Iron deficient anemia-unclear etiology hemoglobin- STABLE 11.3 Hold off any IV iron.  Continue p.o. iron.  # MGUS- IgGKappa-0.4 g/dL; kappa lambda light chain ratio normal.  Clinically stable.  Repeat myeloma numbers in 6 months.  # DISPOSITION: # NO infusion today.  # follow up 6 months- MD; Sara Fields- 1 week prior-  cbc/bmp-possible venofer; MM panel; thyroid tumor marker/ Thyroid profile-Dr.B  Cc; Dr.Klein.

## 2019-05-06 NOTE — Progress Notes (Signed)
[place] OFFICE PROGRESS NOTE  Patient Care Team: Adin Hector, MD as PCP - General (Internal Medicine) Cammie Sickle, MD as Medical Oncologist (Hematology and Oncology)  Cancer Staging No matching staging information was found for the patient.   Oncology History Overview Note  1999- right thyroid lobectomy by Dr. Orene Desanctis with unusual poorly differentiated cancer, no additional treatment  2015- recurrent mass in thyroid bed. PET-CT showed FDG avid lesion in thyroid bed + nasopharyx.  2015- Dr. 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 Margins not noted [facility], [place], #[id number]).   01/05/2014- Nasopharyngeal biopsy negative for malignancy  04/25/14- completed postoperative XRT completing 65.25 CGy in 29 fractions  05/2014 + 07/2014- PET avidity at the left nasopharynx and left oropharyngeal soft tissue as well as a hypermetabolic 1.5 cm mass in the right thyroid bed. Dr Johney Frame excised the paratracheal mass on 09/02/2014. Inspect of the nasopharynx at the time of surgery revealed no obvious abnormality and no biopsy was completed.   09/2014- NP avid area biopsied and negative for carcinoma. SHe underwent a thyroid FNA and that was negative for cancer.  # Chronic Anemia- IDA [ EGD- hiatal hernia; Jan 2017- Dr.Rein] AUG 2017- IV Venofer  # AUG 2017- Thyroglobulin 40/Sep 2017 US neck- NED  # Hoarseness of voice- sec to RT [Dr.Bennett; Dec 2017]  # AUG 2017- IgG kappa 0.4gm//N-kappa-Lamda light chains     Malignant neoplasm of thyroid gland ([place])  09/01/2014 Initial Diagnosis   Malignant neoplasm of thyroid gland ([place])      INTERVAL HISTORY:  Sara Fields 78 y.o.  female pleasant patient above history of thyroid cancer; and diagnosis of iron deficiency anemia unclear etiology is here for  follow-up.   Patient denies any new lumps or bumps.  Appetite is good.  No weight loss.  Nausea vomiting.  No chest pain or shortness with cough.  No difficulty swallowing.  Denies any worsening fatigue.  Review of Systems  Constitutional: Negative for chills, diaphoresis, fever, malaise/fatigue and weight loss.  HENT: Negative for nosebleeds and sore throat.   Eyes: Negative for double vision.  Respiratory: Negative for cough, hemoptysis, sputum production, shortness of breath and wheezing.   Cardiovascular: Negative for chest pain, palpitations, orthopnea and leg swelling.  Gastrointestinal: Negative for abdominal pain, blood in stool, constipation, diarrhea, heartburn, melena, nausea and vomiting.  Genitourinary: Negative for dysuria, frequency and urgency.  Musculoskeletal: Positive for neck pain. Negative for back pain and joint pain.  Skin: Negative.  Negative for itching and rash.  Neurological: Negative for dizziness, tingling, focal weakness, weakness and headaches.  Endo/Heme/Allergies: Does not bruise/bleed easily.  Psychiatric/Behavioral: Negative for depression. The patient is not nervous/anxious and does not have insomnia.      PAST MEDICAL HISTORY :  Past Medical History:  Diagnosis Date  . Anemia   . Asthma   . GERD (gastroesophageal reflux disease)   . Hyperlipidemia   . Hypertension   . Thyroid cancer ([place]) 1999 and 2015   Partial thyroidectomy with rad tx's.     PAST SURGICAL HISTORY :   Past Surgical History:  Procedure Laterality Date  . ABDOMINAL HYSTERECTOMY  1973   partial  . ESOPHAGOGASTRODUODENOSCOPY N/A 02/05/2017   Procedure: ESOPHAGOGASTRODUODENOSCOPY (EGD);  Surgeon: Wilford Corner, MD;  Location: [facility] ENDOSCOPY;  Service: Endoscopy;  Laterality: N/A;  . ESOPHAGOGASTRODUODENOSCOPY (  EGD) WITH PROPOFOL N/A 11/16/2015   Procedure: ESOPHAGOGASTRODUODENOSCOPY (EGD) WITH PROPOFOL;  Surgeon: Josefine Class, MD;  Location: [facility] ENDOSCOPY;  Service:  Endoscopy;  Laterality: N/A;  . THYROID SURGERY  1999 and 2015   Partial Thyroidectomy    FAMILY HISTORY :   Family History  Problem Relation Age of Onset  . Lung cancer Father   . Hypertension Father   . Bone cancer Sister   . Hypertension Sister   . Lung cancer Brother   . Hypertension Brother   . Kidney cancer Sister   . Hypertension Sister   . Lung cancer Sister   . Hypertension Sister   . Breast cancer Neg Hx     SOCIAL HISTORY:   Social History   Tobacco Use  . Smoking status: Never Smoker  . Smokeless tobacco: Never Used  Substance Use Topics  . Alcohol use: No  . Drug use: No    ALLERGIES:  has No Known Allergies.  MEDICATIONS:  Current Outpatient Medications  Medication Sig Dispense Refill  . albuterol (PROVENTIL HFA;VENTOLIN HFA) 108 (90 Base) MCG/ACT inhaler Inhale into the lungs every 6 (six) hours as needed for wheezing or shortness of breath.    . allopurinol (ZYLOPRIM) 100 MG tablet Take 100 mg by mouth daily.    Marland Kitchen amLODipine (NORVASC) 5 MG tablet Take 1 tablet by mouth.    Marland Kitchen azelastine (ASTELIN) 0.1 % nasal spray Place 1 spray into both nostrils daily as needed for rhinitis or allergies.     . budesonide-formoterol (SYMBICORT) 160-4.5 MCG/ACT inhaler Inhale 2 puffs into the lungs 2 (two) times daily.    . bumetanide (BUMEX) 1 MG tablet Take 1 mg by mouth daily.    . calcium carbonate (CALCIUM 600) 600 MG TABS tablet Take 600 mg by mouth 2 (two) times daily with a meal.    . levothyroxine (SYNTHROID) 25 MCG tablet Take one pill along with 50 mcg pill [total 75 mcg] prior to breakfast (Patient taking differently: Take 25 mcg by mouth daily before breakfast. ) 90 tablet 3  . LORazepam (ATIVAN) 0.5 MG tablet Take 0.5 mg by mouth every 8 (eight) hours as needed for anxiety.     . meclizine (ANTIVERT) 12.5 MG tablet Take 12.5 mg by mouth 2 (two) times daily as needed for dizziness.     . montelukast (SINGULAIR) 10 MG tablet Take 10 mg by mouth at bedtime.     . pantoprazole (PROTONIX) 40 MG tablet Take 1 tablet (40 mg total) by mouth 2 (two) times daily before a meal. 60 tablet 1  . potassium chloride SA (KLOR-CON M20) 20 MEQ tablet Take 20 mEq by mouth daily.     . propranolol (INDERAL) 20 MG tablet Take 20 mg by mouth 3 (three) times daily.    . rosuvastatin (CRESTOR) 10 MG tablet Take 10 mg by mouth daily.     . traMADol (ULTRAM) 50 MG tablet Take 50 mg by mouth every 6 (six) hours as needed.    . Turmeric 500 MG TABS Take 1 tablet by mouth daily.     No current facility-administered medications for this visit.    Facility-Administered Medications Ordered in Other Visits  Medication Dose Route Frequency Provider Last Rate Last Dose  . 0.9 %  sodium chloride infusion   Intravenous Once Charlaine Dalton R, MD      . iron sucrose (VENOFER) 200 mg IVPB  200 mg Intravenous Once Cammie Sickle, MD  PHYSICAL EXAMINATION: ECOG PERFORMANCE STATUS:   BP 127/75   Pulse 75   Temp 98.3 F (36.8 C) (Tympanic)   Resp (!) 22   Ht 5' 7.53" (1.715 m)   Wt 205 lb (93 kg)   BMI 31.61 kg/m   Filed Weights   05/06/19 1322  Weight: 205 lb (93 kg)   Physical Exam  Constitutional: She is oriented to person, place, and time and well-developed, well-nourished, and in no distress.  HENT:  Head: Normocephalic and atraumatic.  Mouth/Throat: Oropharynx is clear and moist. No oropharyngeal exudate.  Chronic neck scarring/fibrosis.  No masses or lumps felt.  Eyes: Pupils are equal, round, and reactive to light.  Neck: Normal range of motion. Neck supple.  Cardiovascular: Normal rate and regular rhythm.  Pulmonary/Chest: Breath sounds normal. No respiratory distress. She has no wheezes.  Abdominal: Soft. Bowel sounds are normal. She exhibits no distension and no mass. There is no abdominal tenderness. There is no rebound and no guarding.  Musculoskeletal: Normal range of motion.        General: No tenderness or edema.  Neurological: She is  alert and oriented to person, place, and time.  Skin: Skin is warm.  Psychiatric: Affect normal.     LABORATORY DATA:  I have reviewed the data as listed    Component Value Date/Time   NA 139 05/03/2019 1113   K 4.5 05/03/2019 1113   CL 105 05/03/2019 1113   CO2 25 05/03/2019 1113   GLUCOSE 104 (H) 05/03/2019 1113   BUN 19 05/03/2019 1113   CREATININE 1.06 (H) 05/03/2019 1113   CALCIUM 9.2 05/03/2019 1113   PROT 7.8 05/03/2019 1113   ALBUMIN 4.2 05/03/2019 1113   AST 23 05/03/2019 1113   ALT 13 05/03/2019 1113   ALKPHOS 88 05/03/2019 1113   BILITOT 0.7 05/03/2019 1113   GFRNONAA 51 (L) 05/03/2019 1113   GFRAA 59 (L) 05/03/2019 1113    No results found for: SPEP, UPEP  Lab Results  Component Value Date   WBC 5.1 05/03/2019   NEUTROABS 3.9 05/03/2019   HGB 11.3 (L) 05/03/2019   HCT 35.6 (L) 05/03/2019   MCV 97.5 05/03/2019   PLT 286 05/03/2019      Chemistry      Component Value Date/Time   NA 139 05/03/2019 1113   K 4.5 05/03/2019 1113   CL 105 05/03/2019 1113   CO2 25 05/03/2019 1113   BUN 19 05/03/2019 1113   CREATININE 1.06 (H) 05/03/2019 1113      Component Value Date/Time   CALCIUM 9.2 05/03/2019 1113   ALKPHOS 88 05/03/2019 1113   AST 23 05/03/2019 1113   ALT 13 05/03/2019 1113   BILITOT 0.7 05/03/2019 1113       RADIOGRAPHIC STUDIES: I have personally reviewed the radiological images as listed and agreed with the findings in the report. No results found.   ASSESSMENT & PLAN:  Malignant neoplasm of thyroid gland ([place]) # Thyroid ca with local reurence s/p surgery [[facility]; EBRT [2015; no RAI].  Clinically no evidence of recurrence.  TSH 0.8; continue Synthroid 75 mcg a day.  #Iron deficient anemia-unclear etiology hemoglobin- STABLE 11.3 Hold off any IV iron.  Continue p.o. iron.  # MGUS- IgGKappa-0.4 g/dL; kappa lambda light chain ratio normal.  Clinically stable.  Repeat myeloma numbers in 6 months.  # DISPOSITION: # NO infusion today.  #  follow up 6 months- MD; Reva Bores- 1 week prior-  cbc/bmp-possible venofer; MM panel; thyroid tumor marker/  Thyroid profile-Dr.B  Cc; Dr.Klein.    Orders Placed This Encounter  Procedures  . CBC with Differential    Standing Status:   Future    Standing Expiration Date:   05/05/2020  . Basic metabolic panel    Standing Status:   Future    Standing Expiration Date:   05/05/2020  . Multiple Myeloma Panel (SPEP&IFE w/QIG)    Standing Status:   Future    Standing Expiration Date:   05/05/2020  . TgAb+Thyroglobulin IMA or RIA    Standing Status:   Future    Standing Expiration Date:   05/05/2020  . Thyroid Panel With TSH    Standing Status:   Future    Standing Expiration Date:   05/05/2020   All questions were answered. The patient knows to call the clinic with any problems, questions or concerns.     Cammie Sickle, MD 05/06/2019 7:45 PM

## 2019-10-30 ENCOUNTER — Inpatient Hospital Stay: Payer: Medicare Other

## 2019-10-31 ENCOUNTER — Other Ambulatory Visit: Payer: Self-pay

## 2019-11-01 ENCOUNTER — Inpatient Hospital Stay: Payer: Medicare Other | Attending: Internal Medicine

## 2019-11-01 ENCOUNTER — Other Ambulatory Visit: Payer: Self-pay | Admitting: Neurology

## 2019-11-01 ENCOUNTER — Other Ambulatory Visit: Payer: Self-pay

## 2019-11-01 ENCOUNTER — Inpatient Hospital Stay: Payer: Medicare Other

## 2019-11-01 DIAGNOSIS — Z79899 Other long term (current) drug therapy: Secondary | ICD-10-CM | POA: Diagnosis not present

## 2019-11-01 DIAGNOSIS — C73 Malignant neoplasm of thyroid gland: Secondary | ICD-10-CM | POA: Insufficient documentation

## 2019-11-01 DIAGNOSIS — G249 Dystonia, unspecified: Secondary | ICD-10-CM

## 2019-11-01 DIAGNOSIS — D472 Monoclonal gammopathy: Secondary | ICD-10-CM | POA: Insufficient documentation

## 2019-11-01 LAB — BASIC METABOLIC PANEL
Anion gap: 10 (ref 5–15)
BUN: 25 mg/dL — ABNORMAL HIGH (ref 8–23)
CO2: 22 mmol/L (ref 22–32)
Calcium: 9 mg/dL (ref 8.9–10.3)
Chloride: 106 mmol/L (ref 98–111)
Creatinine, Ser: 1.16 mg/dL — ABNORMAL HIGH (ref 0.44–1.00)
GFR calc Af Amer: 52 mL/min — ABNORMAL LOW (ref >60)
GFR calc non Af Amer: 45 mL/min — ABNORMAL LOW (ref >60)
Glucose, Bld: 110 mg/dL — ABNORMAL HIGH (ref 70–99)
Potassium: 4 mmol/L (ref 3.5–5.1)
Sodium: 138 mmol/L (ref 135–145)

## 2019-11-01 LAB — CBC WITH DIFFERENTIAL/PLATELET
Abs Immature Granulocytes: 0.02 10*3/uL (ref 0.00–0.07)
Basophils Absolute: 0 10*3/uL (ref 0.0–0.1)
Basophils Relative: 0 %
Eosinophils Absolute: 0.2 10*3/uL (ref 0.0–0.5)
Eosinophils Relative: 3 %
HCT: 36.1 % (ref 36.0–46.0)
Hemoglobin: 11.3 g/dL — ABNORMAL LOW (ref 12.0–15.0)
Immature Granulocytes: 0 %
Lymphocytes Relative: 13 %
Lymphs Abs: 0.8 10*3/uL (ref 0.7–4.0)
MCH: 31.2 pg (ref 26.0–34.0)
MCHC: 31.3 g/dL (ref 30.0–36.0)
MCV: 99.7 fL (ref 80.0–100.0)
Monocytes Absolute: 0.4 10*3/uL (ref 0.1–1.0)
Monocytes Relative: 7 %
Neutro Abs: 4.3 10*3/uL (ref 1.7–7.7)
Neutrophils Relative %: 77 %
Platelets: 287 10*3/uL (ref 150–400)
RBC: 3.62 MIL/uL — ABNORMAL LOW (ref 3.87–5.11)
RDW: 13.5 % (ref 11.5–15.5)
WBC: 5.7 10*3/uL (ref 4.0–10.5)
nRBC: 0 % (ref 0.0–0.2)

## 2019-11-02 LAB — THYROID PANEL WITH TSH
Free Thyroxine Index: 3 (ref 1.2–4.9)
T3 Uptake Ratio: 33 % (ref 24–39)
T4, Total: 9 ug/dL (ref 4.5–12.0)
TSH: 0.103 u[IU]/mL — ABNORMAL LOW (ref 0.450–4.500)

## 2019-11-02 LAB — TGAB+THYROGLOBULIN IMA OR RIA: Thyroglobulin Antibody: <1 IU/mL (ref 0.0–0.9)

## 2019-11-02 LAB — THYROGLOBULIN BY IMA: Thyroglobulin by IMA: 3.9 ng/mL (ref 1.5–38.5)

## 2019-11-06 ENCOUNTER — Inpatient Hospital Stay: Payer: Medicare Other | Admitting: Internal Medicine

## 2019-11-06 ENCOUNTER — Ambulatory Visit: Payer: Medicare Other

## 2019-11-06 LAB — MULTIPLE MYELOMA PANEL, SERUM
Albumin SerPl Elph-Mcnc: 3.7 g/dL (ref 2.9–4.4)
Albumin/Glob SerPl: 1.2 (ref 0.7–1.7)
Alpha 1: 0.3 g/dL (ref 0.0–0.4)
Alpha2 Glob SerPl Elph-Mcnc: 0.9 g/dL (ref 0.4–1.0)
B-Globulin SerPl Elph-Mcnc: 1.1 g/dL (ref 0.7–1.3)
Gamma Glob SerPl Elph-Mcnc: 1 g/dL (ref 0.4–1.8)
Globulin, Total: 3.2 g/dL (ref 2.2–3.9)
IgA: 230 mg/dL (ref 64–422)
IgG (Immunoglobin G), Serum: 1077 mg/dL (ref 586–1602)
IgM (Immunoglobulin M), Srm: 136 mg/dL (ref 26–217)
M Protein SerPl Elph-Mcnc: 0.3 g/dL — ABNORMAL HIGH
Total Protein ELP: 6.9 g/dL (ref 6.0–8.5)

## 2019-11-09 ENCOUNTER — Ambulatory Visit: Payer: Medicare Other

## 2019-11-13 ENCOUNTER — Other Ambulatory Visit: Payer: Self-pay

## 2019-11-13 ENCOUNTER — Ambulatory Visit
Admission: RE | Admit: 2019-11-13 | Discharge: 2019-11-13 | Disposition: A | Discharge status: Home / Self Care | Payer: Medicare Other | Source: Ambulatory Visit | Attending: Neurology | Admitting: Neurology

## 2019-11-13 DIAGNOSIS — G249 Dystonia, unspecified: Secondary | ICD-10-CM

## 2019-11-25 ENCOUNTER — Other Ambulatory Visit: Payer: Self-pay | Admitting: Internal Medicine

## 2019-11-25 DIAGNOSIS — Z1231 Encounter for screening mammogram for malignant neoplasm of breast: Secondary | ICD-10-CM

## 2019-12-03 ENCOUNTER — Other Ambulatory Visit: Payer: Self-pay

## 2019-12-03 ENCOUNTER — Inpatient Hospital Stay: Payer: Medicare Other | Attending: Internal Medicine | Admitting: Internal Medicine

## 2019-12-03 ENCOUNTER — Inpatient Hospital Stay: Payer: Medicare Other

## 2019-12-03 VITALS — BP 119/66 | HR 58 | Resp 18

## 2019-12-03 VITALS — BP 149/86 | HR 82 | Temp 98.4°F | Resp 20

## 2019-12-03 DIAGNOSIS — D508 Other iron deficiency anemias: Secondary | ICD-10-CM | POA: Diagnosis not present

## 2019-12-03 DIAGNOSIS — D509 Iron deficiency anemia, unspecified: Secondary | ICD-10-CM | POA: Insufficient documentation

## 2019-12-03 DIAGNOSIS — C73 Malignant neoplasm of thyroid gland: Secondary | ICD-10-CM

## 2019-12-03 MED ORDER — SODIUM CHLORIDE 0.9 % IV SOLN
Freq: Once | INTRAVENOUS | Status: AC
  Filled 2019-12-03: qty 250

## 2019-12-03 MED ORDER — IRON SUCROSE 20 MG/ML IV SOLN
200.0000 mg | Freq: Once | INTRAVENOUS | Status: AC
  Administered 2019-12-03: 200 mg via INTRAVENOUS
  Filled 2019-12-03: qty 10

## 2019-12-03 NOTE — Assessment & Plan Note (Addendum)
#   Thyroid ca with local reurence s/p surgery [[facility]; EBRT [2015; no RAI]. STABLE; No clinical no evidence of recurrence.  TSH 0.1; continue Synthroid 75 mcg a day.  #Iron deficient anemia-unclear etiology hemoglobin- STABLE 11.3 proceed with IV iron.  Continue p.o. iron.  # MGUS-JAN 2021- IgGKappa-0.3 g/dL; kappa lambda light chain ratio normal.  Stable; will repeat in 12 months; order at next visit.   # DISPOSITION: # IV venofer infusion today.  # follow up 6 months- MD; Reva Bores- 1 week prior-  cbc/bmp-possible venofer; thyroid tumor marker/ Thyroid profile; iron studies/ferritin-Dr.B  Cc; Dr.Klein.

## 2019-12-03 NOTE — Progress Notes (Signed)
[place] OFFICE PROGRESS NOTE  Patient Care Team: Adin Hector, MD as PCP - General (Internal Medicine) Cammie Sickle, MD as Medical Oncologist (Hematology and Oncology)  Cancer Staging No matching staging information was found for the patient.   Oncology History Overview Note  1999- right thyroid lobectomy by Dr. Orene Desanctis with unusual poorly differentiated cancer, no additional treatment  2015- recurrent mass in thyroid bed. PET-CT showed FDG avid lesion in thyroid bed + nasopharyx.  2015- Dr. 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 Margins not noted [facility], [place], #[id number]).   01/05/2014- Nasopharyngeal biopsy negative for malignancy  04/25/14- completed postoperative XRT completing 65.25 CGy in 29 fractions  05/2014 + 07/2014- PET avidity at the left nasopharynx and left oropharyngeal soft tissue as well as a hypermetabolic 1.5 cm mass in the right thyroid bed. Dr Johney Frame excised the paratracheal mass on 09/02/2014. Inspect of the nasopharynx at the time of surgery revealed no obvious abnormality and no biopsy was completed.   09/2014- NP avid area biopsied and negative for carcinoma. SHe underwent a thyroid FNA and that was negative for cancer.  # Chronic Anemia- IDA [ EGD- hiatal hernia; Jan 2017- Dr.Rein] AUG 2017- IV Venofer  # AUG 2017- Thyroglobulin 40/Sep 2017 US neck- NED  # Hoarseness of voice- sec to RT [Dr.Bennett; Dec 2017]  # AUG 2017- IgG kappa 0.4gm//N-kappa-Lamda light chains     Malignant neoplasm of thyroid gland ([place])  09/01/2014 Initial Diagnosis   Malignant neoplasm of thyroid gland ([place])      INTERVAL HISTORY:  Sara Fields 79 y.o.  female pleasant patient above history of thyroid cancer; and diagnosis of iron deficiency anemia unclear etiology is here for  follow-up.   Patient denies any new lumps or bumps.  Complains of mild fatigue.  She continues on oral iron.  No constipation or diarrhea.  No abdominal pain.  No difficulty swallowing.   Review of Systems  Constitutional: Positive for malaise/fatigue. Negative for chills, diaphoresis, fever and weight loss.  HENT: Negative for nosebleeds and sore throat.   Eyes: Negative for double vision.  Respiratory: Negative for cough, hemoptysis, sputum production, shortness of breath and wheezing.   Cardiovascular: Negative for chest pain, palpitations, orthopnea and leg swelling.  Gastrointestinal: Negative for abdominal pain, blood in stool, constipation, diarrhea, heartburn, melena, nausea and vomiting.  Genitourinary: Negative for dysuria, frequency and urgency.  Musculoskeletal: Positive for neck pain. Negative for back pain and joint pain.  Skin: Negative.  Negative for itching and rash.  Neurological: Negative for dizziness, tingling, focal weakness, weakness and headaches.  Endo/Heme/Allergies: Does not bruise/bleed easily.  Psychiatric/Behavioral: Negative for depression. The patient is not nervous/anxious and does not have insomnia.      PAST MEDICAL HISTORY :  Past Medical History:  Diagnosis Date  . Anemia   . Asthma   . GERD (gastroesophageal reflux disease)   . Hyperlipidemia   . Hypertension   . Thyroid cancer ([place]) 1999 and 2015   Partial thyroidectomy with rad tx's.     PAST SURGICAL HISTORY :   Past Surgical History:  Procedure Laterality Date  . ABDOMINAL HYSTERECTOMY  1973   partial  . ESOPHAGOGASTRODUODENOSCOPY N/A 02/05/2017   Procedure: ESOPHAGOGASTRODUODENOSCOPY (EGD);  Surgeon: Wilford Corner, MD;  Location: [facility] ENDOSCOPY;  Service: Endoscopy;  Laterality: N/A;  . ESOPHAGOGASTRODUODENOSCOPY (EGD)  WITH PROPOFOL N/A 11/16/2015   Procedure: ESOPHAGOGASTRODUODENOSCOPY (EGD) WITH PROPOFOL;  Surgeon: Josefine Class, MD;  Location: [facility] ENDOSCOPY;  Service:  Endoscopy;  Laterality: N/A;  . THYROID SURGERY  1999 and 2015   Partial Thyroidectomy    FAMILY HISTORY :   Family History  Problem Relation Age of Onset  . Lung cancer Father   . Hypertension Father   . Bone cancer Sister   . Hypertension Sister   . Lung cancer Brother   . Hypertension Brother   . Kidney cancer Sister   . Hypertension Sister   . Lung cancer Sister   . Hypertension Sister   . Breast cancer Neg Hx     SOCIAL HISTORY:   Social History   Tobacco Use  . Smoking status: Never Smoker  . Smokeless tobacco: Never Used  Substance Use Topics  . Alcohol use: No  . Drug use: No    ALLERGIES:  has No Known Allergies.  MEDICATIONS:  Current Outpatient Medications  Medication Sig Dispense Refill  . albuterol (PROVENTIL HFA;VENTOLIN HFA) 108 (90 Base) MCG/ACT inhaler Inhale into the lungs every 6 (six) hours as needed for wheezing or shortness of breath.    . allopurinol (ZYLOPRIM) 100 MG tablet Take 100 mg by mouth daily.    Marland Kitchen amLODipine (NORVASC) 5 MG tablet Take 1 tablet by mouth.    Marland Kitchen azelastine (ASTELIN) 0.1 % nasal spray Place 1 spray into both nostrils daily as needed for rhinitis or allergies.     . budesonide-formoterol (SYMBICORT) 160-4.5 MCG/ACT inhaler Inhale 2 puffs into the lungs 2 (two) times daily.    . bumetanide (BUMEX) 1 MG tablet Take 1 mg by mouth daily.    . calcium carbonate (CALCIUM 600) 600 MG TABS tablet Take 600 mg by mouth 2 (two) times daily with a meal.    . levothyroxine (SYNTHROID) 25 MCG tablet Take one pill along with 50 mcg pill [total 75 mcg] prior to breakfast (Patient taking differently: Take 25 mcg by mouth daily before breakfast. ) 90 tablet 3  . LORazepam (ATIVAN) 0.5 MG tablet Take 0.5 mg by mouth every 8 (eight) hours as needed for anxiety.     . meclizine (ANTIVERT) 12.5 MG tablet Take 12.5 mg by mouth 2 (two) times daily as needed for dizziness.     . montelukast (SINGULAIR) 10 MG tablet Take 10 mg by mouth at bedtime.     . pantoprazole (PROTONIX) 40 MG tablet Take 1 tablet (40 mg total) by mouth 2 (two) times daily before a meal. 60 tablet 1  . potassium chloride SA (KLOR-CON M20) 20 MEQ tablet Take 20 mEq by mouth daily.     . propranolol (INDERAL) 20 MG tablet Take 20 mg by mouth 3 (three) times daily.    . rosuvastatin (CRESTOR) 10 MG tablet Take 10 mg by mouth daily.     . traMADol (ULTRAM) 50 MG tablet Take 50 mg by mouth every 6 (six) hours as needed.    . Turmeric 500 MG TABS Take 1 tablet by mouth daily.     No current facility-administered medications for this visit.   Facility-Administered Medications Ordered in Other Visits  Medication Dose Route Frequency Provider Last Rate Last Admin  . 0.9 %  sodium chloride infusion   Intravenous Once Charlaine Dalton R, MD      . iron sucrose (VENOFER) 200 mg IVPB  200 mg Intravenous Once Cammie Sickle, MD        PHYSICAL  EXAMINATION: ECOG PERFORMANCE STATUS:   BP (!) 149/86 (BP Location: Left Arm, Patient Position: Sitting)   Pulse 82   Temp 98.4 F (36.9 C) (Tympanic)   Resp 20   There were no vitals filed for this visit. Physical Exam  Constitutional: She is oriented to person, place, and time and well-developed, well-nourished, and in no distress.  HENT:  Head: Normocephalic and atraumatic.  Mouth/Throat: Oropharynx is clear and moist. No oropharyngeal exudate.  Chronic neck scarring/fibrosis.  No masses or lumps felt.  Eyes: Pupils are equal, round, and reactive to light.  Cardiovascular: Normal rate and regular rhythm.  Pulmonary/Chest: Breath sounds normal. No respiratory distress. She has no wheezes.  Abdominal: Soft. Bowel sounds are normal. She exhibits no distension and no mass. There is no abdominal tenderness. There is no rebound and no guarding.  Musculoskeletal:        General: No tenderness or edema. Normal range of motion.     Cervical back: Normal range of motion and neck supple.  Neurological: She is alert and  oriented to person, place, and time.  Skin: Skin is warm.  Psychiatric: Affect normal.     LABORATORY DATA:  I have reviewed the data as listed    Component Value Date/Time   NA 138 11/01/2019 0954   K 4.0 11/01/2019 0954   CL 106 11/01/2019 0954   CO2 22 11/01/2019 0954   GLUCOSE 110 (H) 11/01/2019 0954   BUN 25 (H) 11/01/2019 0954   CREATININE 1.16 (H) 11/01/2019 0954   CALCIUM 9.0 11/01/2019 0954   PROT 7.8 05/03/2019 1113   ALBUMIN 4.2 05/03/2019 1113   AST 23 05/03/2019 1113   ALT 13 05/03/2019 1113   ALKPHOS 88 05/03/2019 1113   BILITOT 0.7 05/03/2019 1113   GFRNONAA 45 (L) 11/01/2019 0954   GFRAA 52 (L) 11/01/2019 0954    No results found for: SPEP, UPEP  Lab Results  Component Value Date   WBC 5.7 11/01/2019   NEUTROABS 4.3 11/01/2019   HGB 11.3 (L) 11/01/2019   HCT 36.1 11/01/2019   MCV 99.7 11/01/2019   PLT 287 11/01/2019      Chemistry      Component Value Date/Time   NA 138 11/01/2019 0954   K 4.0 11/01/2019 0954   CL 106 11/01/2019 0954   CO2 22 11/01/2019 0954   BUN 25 (H) 11/01/2019 0954   CREATININE 1.16 (H) 11/01/2019 0954      Component Value Date/Time   CALCIUM 9.0 11/01/2019 0954   ALKPHOS 88 05/03/2019 1113   AST 23 05/03/2019 1113   ALT 13 05/03/2019 1113   BILITOT 0.7 05/03/2019 1113       RADIOGRAPHIC STUDIES: I have personally reviewed the radiological images as listed and agreed with the findings in the report. No results found.   ASSESSMENT & PLAN:  Malignant neoplasm of thyroid gland ([place]) # Thyroid ca with local reurence s/p surgery [[facility]; EBRT [2015; no RAI]. STABLE; No clinical no evidence of recurrence.  TSH 0.1; continue Synthroid 75 mcg a day.  #Iron deficient anemia-unclear etiology hemoglobin- STABLE 11.3 proceed with IV iron.  Continue p.o. iron.  # MGUS-JAN 2021- IgGKappa-0.3 g/dL; kappa lambda light chain ratio normal.  Stable; will repeat in 12 months; order at next visit.   # DISPOSITION: # IV venofer  infusion today.  # follow up 6 months- MD; Reva Bores- 1 week prior-  cbc/bmp-possible venofer; thyroid tumor marker/ Thyroid profile; iron studies/ferritin-Dr.B  Cc; Dr.Klein.    Orders  Placed This Encounter  Procedures  . CBC with Differential    Standing Status:   Future    Standing Expiration Date:   12/02/2020  . Basic metabolic panel    Standing Status:   Future    Standing Expiration Date:   12/02/2020  . Thyroid Panel With TSH    Standing Status:   Future    Standing Expiration Date:   12/02/2020  . Ferritin    Standing Status:   Future    Standing Expiration Date:   12/02/2020  . Iron and TIBC    Standing Status:   Future    Standing Expiration Date:   12/02/2020  . TgAb+Thyroglobulin IMA or RIA    Standing Status:   Future    Standing Expiration Date:   12/02/2020   All questions were answered. The patient knows to call the clinic with any problems, questions or concerns.     Cammie Sickle, MD 12/03/2019 2:48 PM

## 2019-12-03 NOTE — Progress Notes (Signed)
Pt tolerated Venofer infusion well with no signs of complication or reaction. RN educate pt on the importance of notifying the clinic if any complications occur at home and if it is an emergency to call 911. Pt verbalized understanding. VSS. Pt stable for discharge.   Lacheryl Niesen [employer]

## 2019-12-23 ENCOUNTER — Ambulatory Visit
Admission: RE | Admit: 2019-12-23 | Discharge: 2019-12-23 | Disposition: A | Discharge status: Home / Self Care | Payer: Medicare Other | Source: Ambulatory Visit | Attending: Internal Medicine | Admitting: Internal Medicine

## 2019-12-23 DIAGNOSIS — Z1231 Encounter for screening mammogram for malignant neoplasm of breast: Secondary | ICD-10-CM | POA: Diagnosis present

## 2020-01-09 ENCOUNTER — Other Ambulatory Visit: Payer: Self-pay | Admitting: Specialist

## 2020-01-09 DIAGNOSIS — R0609 Other forms of dyspnea: Secondary | ICD-10-CM

## 2020-01-09 DIAGNOSIS — J849 Interstitial pulmonary disease, unspecified: Secondary | ICD-10-CM

## 2020-01-22 ENCOUNTER — Other Ambulatory Visit: Payer: Self-pay

## 2020-01-22 ENCOUNTER — Ambulatory Visit
Admission: RE | Admit: 2020-01-22 | Discharge: 2020-01-22 | Disposition: A | Discharge status: Home / Self Care | Payer: Medicare Other | Source: Ambulatory Visit | Attending: Specialist | Admitting: Specialist

## 2020-01-22 DIAGNOSIS — J849 Interstitial pulmonary disease, unspecified: Secondary | ICD-10-CM

## 2020-01-22 DIAGNOSIS — R0609 Other forms of dyspnea: Secondary | ICD-10-CM

## 2020-01-22 DIAGNOSIS — R0600 Dyspnea, unspecified: Secondary | ICD-10-CM | POA: Insufficient documentation

## 2020-02-14 ENCOUNTER — Other Ambulatory Visit: Payer: Self-pay | Admitting: Specialist

## 2020-02-14 DIAGNOSIS — J849 Interstitial pulmonary disease, unspecified: Secondary | ICD-10-CM

## 2020-05-26 ENCOUNTER — Inpatient Hospital Stay: Payer: Medicare Other | Attending: Internal Medicine

## 2020-05-26 ENCOUNTER — Other Ambulatory Visit: Payer: Self-pay

## 2020-05-26 DIAGNOSIS — Z85850 Personal history of malignant neoplasm of thyroid: Secondary | ICD-10-CM | POA: Insufficient documentation

## 2020-05-26 DIAGNOSIS — Z79899 Other long term (current) drug therapy: Secondary | ICD-10-CM | POA: Diagnosis not present

## 2020-05-26 DIAGNOSIS — D509 Iron deficiency anemia, unspecified: Secondary | ICD-10-CM | POA: Insufficient documentation

## 2020-05-26 DIAGNOSIS — Z923 Personal history of irradiation: Secondary | ICD-10-CM | POA: Diagnosis not present

## 2020-05-26 DIAGNOSIS — R5383 Other fatigue: Secondary | ICD-10-CM | POA: Diagnosis not present

## 2020-05-26 DIAGNOSIS — D508 Other iron deficiency anemias: Secondary | ICD-10-CM

## 2020-05-26 DIAGNOSIS — C73 Malignant neoplasm of thyroid gland: Secondary | ICD-10-CM

## 2020-05-26 LAB — CBC WITH DIFFERENTIAL/PLATELET
Abs Immature Granulocytes: 0.01 10*3/uL (ref 0.00–0.07)
Basophils Absolute: 0 10*3/uL (ref 0.0–0.1)
Basophils Relative: 0 %
Eosinophils Absolute: 0.1 10*3/uL (ref 0.0–0.5)
Eosinophils Relative: 2 %
HCT: 36.7 % (ref 36.0–46.0)
Hemoglobin: 12 g/dL (ref 12.0–15.0)
Immature Granulocytes: 0 %
Lymphocytes Relative: 12 %
Lymphs Abs: 0.8 10*3/uL (ref 0.7–4.0)
MCH: 30.6 pg (ref 26.0–34.0)
MCHC: 32.7 g/dL (ref 30.0–36.0)
MCV: 93.6 fL (ref 80.0–100.0)
Monocytes Absolute: 0.4 10*3/uL (ref 0.1–1.0)
Monocytes Relative: 6 %
Neutro Abs: 5.3 10*3/uL (ref 1.7–7.7)
Neutrophils Relative %: 80 %
Platelets: 353 10*3/uL (ref 150–400)
RBC: 3.92 MIL/uL (ref 3.87–5.11)
RDW: 13.9 % (ref 11.5–15.5)
WBC: 6.6 10*3/uL (ref 4.0–10.5)
nRBC: 0 % (ref 0.0–0.2)

## 2020-05-26 LAB — BASIC METABOLIC PANEL
Anion gap: 9 (ref 5–15)
BUN: 20 mg/dL (ref 8–23)
CO2: 27 mmol/L (ref 22–32)
Calcium: 9 mg/dL (ref 8.9–10.3)
Chloride: 103 mmol/L (ref 98–111)
Creatinine, Ser: 1.05 mg/dL — ABNORMAL HIGH (ref 0.44–1.00)
GFR calc Af Amer: 58 mL/min — ABNORMAL LOW (ref >60)
GFR calc non Af Amer: 50 mL/min — ABNORMAL LOW (ref >60)
Glucose, Bld: 98 mg/dL (ref 70–99)
Potassium: 4 mmol/L (ref 3.5–5.1)
Sodium: 139 mmol/L (ref 135–145)

## 2020-05-26 LAB — IRON AND TIBC
Iron: 57 ug/dL (ref 28–170)
Saturation Ratios: 21 % (ref 10.4–31.8)
TIBC: 276 ug/dL (ref 250–450)
UIBC: 219 ug/dL

## 2020-05-26 LAB — FERRITIN: Ferritin: 409 ng/mL — ABNORMAL HIGH (ref 11–307)

## 2020-05-27 LAB — THYROID PANEL WITH TSH
Free Thyroxine Index: 2.6 (ref 1.2–4.9)
T3 Uptake Ratio: 28 % (ref 24–39)
T4, Total: 9.4 ug/dL (ref 4.5–12.0)
TSH: 0.199 u[IU]/mL — ABNORMAL LOW (ref 0.450–4.500)

## 2020-05-28 LAB — THYROGLOBULIN BY IMA: Thyroglobulin by IMA: 4.7 ng/mL (ref 1.5–38.5)

## 2020-05-28 LAB — TGAB+THYROGLOBULIN IMA OR RIA: Thyroglobulin Antibody: <1 IU/mL (ref 0.0–0.9)

## 2020-06-02 ENCOUNTER — Other Ambulatory Visit: Payer: Self-pay

## 2020-06-02 ENCOUNTER — Encounter: Payer: Self-pay | Admitting: Internal Medicine

## 2020-06-02 ENCOUNTER — Inpatient Hospital Stay (HOSPITAL_BASED_OUTPATIENT_CLINIC_OR_DEPARTMENT_OTHER): Payer: Medicare Other | Admitting: Internal Medicine

## 2020-06-02 ENCOUNTER — Inpatient Hospital Stay: Payer: Medicare Other

## 2020-06-02 VITALS — BP 135/70 | HR 87 | Temp 97.9°F | Resp 18 | Ht 67.53 in | Wt 201.8 lb

## 2020-06-02 DIAGNOSIS — C73 Malignant neoplasm of thyroid gland: Secondary | ICD-10-CM | POA: Diagnosis not present

## 2020-06-02 DIAGNOSIS — D509 Iron deficiency anemia, unspecified: Secondary | ICD-10-CM | POA: Diagnosis not present

## 2020-06-02 DIAGNOSIS — D508 Other iron deficiency anemias: Secondary | ICD-10-CM

## 2020-06-02 DIAGNOSIS — D472 Monoclonal gammopathy: Secondary | ICD-10-CM

## 2020-06-02 NOTE — Progress Notes (Signed)
[place] OFFICE PROGRESS NOTE  Patient Care Team: Adin Hector, MD as PCP - General (Internal Medicine) Cammie Sickle, MD as Medical Oncologist (Hematology and Oncology)  Cancer Staging No matching staging information was found for the patient.   Oncology History Overview Note  1999- right thyroid lobectomy by Dr. Orene Desanctis with unusual poorly differentiated cancer, no additional treatment  2015- recurrent mass in thyroid bed. PET-CT showed FDG avid lesion in thyroid bed + nasopharyx.  2015- Dr. 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 Margins not noted [facility], [place], #[id number]).   01/05/2014- Nasopharyngeal biopsy negative for malignancy  04/25/14- completed postoperative XRT completing 65.25 CGy in 29 fractions  05/2014 + 07/2014- PET avidity at the left nasopharynx and left oropharyngeal soft tissue as well as a hypermetabolic 1.5 cm mass in the right thyroid bed. Dr Johney Frame excised the paratracheal mass on 09/02/2014. Inspect of the nasopharynx at the time of surgery revealed no obvious abnormality and no biopsy was completed.   09/2014- NP avid area biopsied and negative for carcinoma. SHe underwent a thyroid FNA and that was negative for cancer.  # Chronic Anemia- IDA [ EGD- hiatal hernia; Jan 2017- Dr.Rein] AUG 2017- IV Venofer  # AUG 2017- Thyroglobulin 40/Sep 2017 US neck- NED  # Hoarseness of voice- sec to RT [Dr.Bennett; Dec 2017]  # AUG 2017- IgG kappa 0.4gm//N-kappa-Lamda light chains     Malignant neoplasm of thyroid gland ([place])  09/01/2014 Initial Diagnosis   Malignant neoplasm of thyroid gland ([place])      INTERVAL HISTORY:  Sara Fields 79 y.o.  female pleasant patient above history of thyroid cancer; and diagnosis of iron deficiency anemia unclear etiology is here for  follow-up.   Patient denies any new lumps or bumps.  Complains of mild fatigue.  She continues on oral iron.  No constipation or diarrhea.  No abdominal pain.  No difficulty swallowing.  Review of Systems  Constitutional: Positive for malaise/fatigue. Negative for chills, diaphoresis, fever and weight loss.  HENT: Negative for nosebleeds and sore throat.   Eyes: Negative for double vision.  Respiratory: Negative for cough, hemoptysis, sputum production, shortness of breath and wheezing.   Cardiovascular: Negative for chest pain, palpitations, orthopnea and leg swelling.  Gastrointestinal: Negative for abdominal pain, blood in stool, constipation, diarrhea, heartburn, melena, nausea and vomiting.  Genitourinary: Negative for dysuria, frequency and urgency.  Musculoskeletal: Positive for neck pain. Negative for back pain and joint pain.  Skin: Negative.  Negative for itching and rash.  Neurological: Negative for dizziness, tingling, focal weakness, weakness and headaches.  Endo/Heme/Allergies: Does not bruise/bleed easily.  Psychiatric/Behavioral: Negative for depression. The patient is not nervous/anxious and does not have insomnia.      PAST MEDICAL HISTORY :  Past Medical History:  Diagnosis Date  . Anemia   . Asthma   . GERD (gastroesophageal reflux disease)   . Hyperlipidemia   . Hypertension   . Thyroid cancer ([place]) 1999 and 2015   Partial thyroidectomy with rad tx's.     PAST SURGICAL HISTORY :   Past Surgical History:  Procedure Laterality Date  . ABDOMINAL HYSTERECTOMY  1973   partial  . ESOPHAGOGASTRODUODENOSCOPY N/A 02/05/2017   Procedure: ESOPHAGOGASTRODUODENOSCOPY (EGD);  Surgeon: Wilford Corner, MD;  Location: [facility] ENDOSCOPY;  Service: Endoscopy;  Laterality: N/A;  . ESOPHAGOGASTRODUODENOSCOPY (EGD) WITH  PROPOFOL N/A 11/16/2015   Procedure: ESOPHAGOGASTRODUODENOSCOPY (EGD) WITH PROPOFOL;  Surgeon: Josefine Class, MD;  Location: [facility] ENDOSCOPY;  Service:  Endoscopy;  Laterality: N/A;  . THYROID SURGERY  1999 and 2015   Partial Thyroidectomy    FAMILY HISTORY :   Family History  Problem Relation Age of Onset  . Lung cancer Father   . Hypertension Father   . Bone cancer Sister   . Hypertension Sister   . Lung cancer Brother   . Hypertension Brother   . Kidney cancer Sister   . Hypertension Sister   . Lung cancer Sister   . Hypertension Sister   . Breast cancer Neg Hx     SOCIAL HISTORY:   Social History   Tobacco Use  . Smoking status: Never Smoker  . Smokeless tobacco: Never Used  Substance Use Topics  . Alcohol use: No  . Drug use: No    ALLERGIES:  has No Known Allergies.  MEDICATIONS:  Current Outpatient Medications  Medication Sig Dispense Refill  . albuterol (PROVENTIL HFA;VENTOLIN HFA) 108 (90 Base) MCG/ACT inhaler Inhale into the lungs every 6 (six) hours as needed for wheezing or shortness of breath.    . allopurinol (ZYLOPRIM) 100 MG tablet Take 100 mg by mouth daily.    Marland Kitchen amLODipine (NORVASC) 5 MG tablet Take 1 tablet by mouth.    Marland Kitchen azelastine (ASTELIN) 0.1 % nasal spray Place 1 spray into both nostrils daily as needed for rhinitis or allergies.     . budesonide-formoterol (SYMBICORT) 160-4.5 MCG/ACT inhaler Inhale 2 puffs into the lungs 2 (two) times daily.    . bumetanide (BUMEX) 1 MG tablet Take 1 mg by mouth daily.    . calcium carbonate (CALCIUM 600) 600 MG TABS tablet Take 600 mg by mouth 2 (two) times daily with a meal.    . levothyroxine (SYNTHROID) 25 MCG tablet Take one pill along with 50 mcg pill [total 75 mcg] prior to breakfast (Patient taking differently: Take 25 mcg by mouth daily before breakfast. ) 90 tablet 3  . LORazepam (ATIVAN) 0.5 MG tablet Take 0.5 mg by mouth every 8 (eight) hours as needed for anxiety.     . meclizine (ANTIVERT) 12.5 MG tablet Take 12.5 mg by mouth 2 (two) times daily as needed for dizziness.     . montelukast (SINGULAIR) 10 MG tablet Take 10 mg by mouth at bedtime.     . pantoprazole (PROTONIX) 40 MG tablet Take 1 tablet (40 mg total) by mouth 2 (two) times daily before a meal. 60 tablet 1  . potassium chloride SA (KLOR-CON M20) 20 MEQ tablet Take 20 mEq by mouth daily.     . propranolol (INDERAL) 20 MG tablet Take 20 mg by mouth 3 (three) times daily.    . rosuvastatin (CRESTOR) 10 MG tablet Take 10 mg by mouth daily.     . traMADol (ULTRAM) 50 MG tablet Take 50 mg by mouth every 6 (six) hours as needed.    . Turmeric 500 MG TABS Take 1 tablet by mouth daily.     No current facility-administered medications for this visit.   Facility-Administered Medications Ordered in Other Visits  Medication Dose Route Frequency Provider Last Rate Last Admin  . 0.9 %  sodium chloride infusion   Intravenous Once Charlaine Dalton R, MD      . iron sucrose (VENOFER) 200 mg IVPB  200 mg Intravenous Once Cammie Sickle, MD        PHYSICAL EXAMINATION:  ECOG PERFORMANCE STATUS:   BP 135/70 (BP Location: Left Arm, Patient Position: Sitting, Cuff Size: Large)   Pulse 87   Temp 97.9 F (36.6 C) (Tympanic)   Resp 18   Ht 5' 7.53" (1.715 m)   Wt 201 lb 12.8 oz (91.5 kg)   SpO2 100%   BMI 31.11 kg/m   Filed Weights   06/02/20 1301  Weight: 201 lb 12.8 oz (91.5 kg)   Physical Exam HENT:     Head: Normocephalic and atraumatic.     Mouth/Throat:     Pharynx: No oropharyngeal exudate.  Eyes:     Pupils: Pupils are equal, round, and reactive to light.  Cardiovascular:     Rate and Rhythm: Normal rate and regular rhythm.  Pulmonary:     Effort: No respiratory distress.     Breath sounds: Normal breath sounds. No wheezing.  Abdominal:     General: Bowel sounds are normal. There is no distension.     Palpations: Abdomen is soft. There is no mass.     Tenderness: There is no abdominal tenderness. There is no guarding or rebound.  Musculoskeletal:        General: No tenderness. Normal range of motion.     Cervical back: Normal range of motion and neck  supple.  Skin:    General: Skin is warm.  Neurological:     Mental Status: She is alert and oriented to person, place, and time.  Psychiatric:        Mood and Affect: Affect normal.      LABORATORY DATA:  I have reviewed the data as listed    Component Value Date/Time   NA 139 05/26/2020 1358   K 4.0 05/26/2020 1358   CL 103 05/26/2020 1358   CO2 27 05/26/2020 1358   GLUCOSE 98 05/26/2020 1358   BUN 20 05/26/2020 1358   CREATININE 1.05 (H) 05/26/2020 1358   CALCIUM 9.0 05/26/2020 1358   PROT 7.8 05/03/2019 1113   ALBUMIN 4.2 05/03/2019 1113   AST 23 05/03/2019 1113   ALT 13 05/03/2019 1113   ALKPHOS 88 05/03/2019 1113   BILITOT 0.7 05/03/2019 1113   GFRNONAA 50 (L) 05/26/2020 1358   GFRAA 58 (L) 05/26/2020 1358    No results found for: SPEP, UPEP  Lab Results  Component Value Date   WBC 6.6 05/26/2020   NEUTROABS 5.3 05/26/2020   HGB 12.0 05/26/2020   HCT 36.7 05/26/2020   MCV 93.6 05/26/2020   PLT 353 05/26/2020      Chemistry      Component Value Date/Time   NA 139 05/26/2020 1358   K 4.0 05/26/2020 1358   CL 103 05/26/2020 1358   CO2 27 05/26/2020 1358   BUN 20 05/26/2020 1358   CREATININE 1.05 (H) 05/26/2020 1358      Component Value Date/Time   CALCIUM 9.0 05/26/2020 1358   ALKPHOS 88 05/03/2019 1113   AST 23 05/03/2019 1113   ALT 13 05/03/2019 1113   BILITOT 0.7 05/03/2019 1113       RADIOGRAPHIC STUDIES: I have personally reviewed the radiological images as listed and agreed with the findings in the report. No results found.   ASSESSMENT & PLAN:  Malignant neoplasm of thyroid gland ([place]) # Thyroid ca with local reurence s/p surgery [[facility]; EBRT [2015; no RAI]. STABLE; No clinical no evidence of recurrence.  TSH 0.199; continue Synthroid 75 mcg a day.  #Iron deficient anemia-unclear etiology hemoglobin- STABLE 12.3 -STABLE;   Continue  p.o. iron.  # MGUS-JAN 2021- IgGKappa-0.3 g/dL; kappa lambda light chain ratio normal.  Stable; will  repeat in 6 months.   # DISPOSITION: # NO IV venofer infusion. # follow up 6 months- MD; Reva Bores- 1 week prior-  cbc/bmp-possible venofer; thyroid tumor marker/ Thyroid profile; iron studies/ferritin; MM l=panel; K/L light chains-Dr.B  Cc; Dr.Klein.    Orders Placed This Encounter  Procedures  . CBC with Differential    Standing Status:   Future    Standing Expiration Date:   06/02/2021  . Basic metabolic panel    Standing Status:   Future    Standing Expiration Date:   06/02/2021  . Multiple Myeloma Panel (SPEP&IFE w/QIG)    Standing Status:   Future    Standing Expiration Date:   06/02/2021  . Kappa/lambda light chains    Standing Status:   Future    Standing Expiration Date:   06/02/2021  . Ferritin    Standing Status:   Future    Standing Expiration Date:   06/02/2021  . Iron and TIBC    Standing Status:   Future    Standing Expiration Date:   06/02/2021  . Thyroid Panel With TSH    Standing Status:   Future    Standing Expiration Date:   06/02/2021  . TgAb+Thyroglobulin IMA or RIA    Standing Status:   Future    Standing Expiration Date:   06/02/2021   All questions were answered. The patient knows to call the clinic with any problems, questions or concerns.     Cammie Sickle, MD 06/02/2020 2:54 PM

## 2020-06-02 NOTE — Assessment & Plan Note (Signed)
#   Thyroid ca with local reurence s/p surgery [[facility]; EBRT [2015; no RAI]. STABLE; No clinical no evidence of recurrence.  TSH 0.199; continue Synthroid 75 mcg a day.  #Iron deficient anemia-unclear etiology hemoglobin- STABLE 12.3 -STABLE;   Continue p.o. iron.  # MGUS-JAN 2021- IgGKappa-0.3 g/dL; kappa lambda light chain ratio normal.  Stable; will repeat in 6 months.   # DISPOSITION: # NO IV venofer infusion. # follow up 6 months- MD; Reva Bores- 1 week prior-  cbc/bmp-possible venofer; thyroid tumor marker/ Thyroid profile; iron studies/ferritin; MM l=panel; K/L light chains-Dr.B  Cc; Dr.Klein.

## 2020-08-13 ENCOUNTER — Other Ambulatory Visit: Payer: Self-pay

## 2020-08-13 ENCOUNTER — Ambulatory Visit
Admission: RE | Admit: 2020-08-13 | Discharge: 2020-08-13 | Disposition: A | Discharge status: Home / Self Care | Payer: Medicare Other | Source: Ambulatory Visit | Attending: Specialist | Admitting: Specialist

## 2020-08-13 ENCOUNTER — Other Ambulatory Visit: Payer: Self-pay | Admitting: Specialist

## 2020-08-13 DIAGNOSIS — J849 Interstitial pulmonary disease, unspecified: Secondary | ICD-10-CM | POA: Diagnosis present

## 2020-11-26 ENCOUNTER — Inpatient Hospital Stay: Payer: Medicare HMO | Attending: Internal Medicine

## 2020-11-26 DIAGNOSIS — D508 Other iron deficiency anemias: Secondary | ICD-10-CM

## 2020-11-26 DIAGNOSIS — Z79899 Other long term (current) drug therapy: Secondary | ICD-10-CM | POA: Diagnosis not present

## 2020-11-26 DIAGNOSIS — D509 Iron deficiency anemia, unspecified: Secondary | ICD-10-CM | POA: Diagnosis not present

## 2020-11-26 DIAGNOSIS — D472 Monoclonal gammopathy: Secondary | ICD-10-CM | POA: Insufficient documentation

## 2020-11-26 DIAGNOSIS — C73 Malignant neoplasm of thyroid gland: Secondary | ICD-10-CM | POA: Insufficient documentation

## 2020-11-26 LAB — CBC WITH DIFFERENTIAL/PLATELET
Abs Immature Granulocytes: 0.09 10*3/uL — ABNORMAL HIGH (ref 0.00–0.07)
Basophils Absolute: 0 10*3/uL (ref 0.0–0.1)
Basophils Relative: 1 %
Eosinophils Absolute: 0.2 10*3/uL (ref 0.0–0.5)
Eosinophils Relative: 2 %
HCT: 36.9 % (ref 36.0–46.0)
Hemoglobin: 12.2 g/dL (ref 12.0–15.0)
Immature Granulocytes: 1 %
Lymphocytes Relative: 13 %
Lymphs Abs: 1.1 10*3/uL (ref 0.7–4.0)
MCH: 31.4 pg (ref 26.0–34.0)
MCHC: 33.1 g/dL (ref 30.0–36.0)
MCV: 94.9 fL (ref 80.0–100.0)
Monocytes Absolute: 0.7 10*3/uL (ref 0.1–1.0)
Monocytes Relative: 8 %
Neutro Abs: 6.4 10*3/uL (ref 1.7–7.7)
Neutrophils Relative %: 75 %
Platelets: 340 10*3/uL (ref 150–400)
RBC: 3.89 MIL/uL (ref 3.87–5.11)
RDW: 14.2 % (ref 11.5–15.5)
WBC: 8.5 10*3/uL (ref 4.0–10.5)
nRBC: 0 % (ref 0.0–0.2)

## 2020-11-26 LAB — BASIC METABOLIC PANEL
Anion gap: 7 (ref 5–15)
BUN: 35 mg/dL — ABNORMAL HIGH (ref 8–23)
CO2: 25 mmol/L (ref 22–32)
Calcium: 8.8 mg/dL — ABNORMAL LOW (ref 8.9–10.3)
Chloride: 105 mmol/L (ref 98–111)
Creatinine, Ser: 1.12 mg/dL — ABNORMAL HIGH (ref 0.44–1.00)
GFR, Estimated: 50 mL/min — ABNORMAL LOW (ref >60)
Glucose, Bld: 116 mg/dL — ABNORMAL HIGH (ref 70–99)
Potassium: 4.2 mmol/L (ref 3.5–5.1)
Sodium: 137 mmol/L (ref 135–145)

## 2020-11-26 LAB — IRON AND TIBC
Iron: 59 ug/dL (ref 28–170)
Saturation Ratios: 19 % (ref 10.4–31.8)
TIBC: 307 ug/dL (ref 250–450)
UIBC: 248 ug/dL

## 2020-11-26 LAB — FERRITIN: Ferritin: 545 ng/mL — ABNORMAL HIGH (ref 11–307)

## 2020-11-27 LAB — THYROID PANEL WITH TSH
Free Thyroxine Index: 2.6 (ref 1.2–4.9)
T3 Uptake Ratio: 32 % (ref 24–39)
T4, Total: 8 ug/dL (ref 4.5–12.0)
TSH: 0.075 u[IU]/mL — ABNORMAL LOW (ref 0.450–4.500)

## 2020-11-27 LAB — TGAB+THYROGLOBULIN IMA OR RIA: Thyroglobulin Antibody: <1 IU/mL (ref 0.0–0.9)

## 2020-11-27 LAB — KAPPA/LAMBDA LIGHT CHAINS
Kappa free light chain: 25.2 mg/L — ABNORMAL HIGH (ref 3.3–19.4)
Kappa, lambda light chain ratio: 1.21 (ref 0.26–1.65)
Lambda free light chains: 20.9 mg/L (ref 5.7–26.3)

## 2020-11-27 LAB — THYROGLOBULIN BY IMA: Thyroglobulin by IMA: 3 ng/mL (ref 1.5–38.5)

## 2020-11-28 LAB — MULTIPLE MYELOMA PANEL, SERUM
Albumin SerPl Elph-Mcnc: 3.5 g/dL (ref 2.9–4.4)
Albumin/Glob SerPl: 1.1 (ref 0.7–1.7)
Alpha 1: 0.2 g/dL (ref 0.0–0.4)
Alpha2 Glob SerPl Elph-Mcnc: 1 g/dL (ref 0.4–1.0)
B-Globulin SerPl Elph-Mcnc: 1 g/dL (ref 0.7–1.3)
Gamma Glob SerPl Elph-Mcnc: 1.2 g/dL (ref 0.4–1.8)
Globulin, Total: 3.3 g/dL (ref 2.2–3.9)
IgA: 219 mg/dL (ref 64–422)
IgG (Immunoglobin G), Serum: 1148 mg/dL (ref 586–1602)
IgM (Immunoglobulin M), Srm: 158 mg/dL (ref 26–217)
M Protein SerPl Elph-Mcnc: 0.6 g/dL — ABNORMAL HIGH
Total Protein ELP: 6.8 g/dL (ref 6.0–8.5)

## 2020-12-02 ENCOUNTER — Encounter: Payer: Self-pay | Admitting: Internal Medicine

## 2020-12-02 ENCOUNTER — Inpatient Hospital Stay (HOSPITAL_BASED_OUTPATIENT_CLINIC_OR_DEPARTMENT_OTHER): Payer: Medicare HMO | Admitting: Internal Medicine

## 2020-12-02 ENCOUNTER — Inpatient Hospital Stay: Payer: Medicare HMO

## 2020-12-02 ENCOUNTER — Other Ambulatory Visit: Payer: Self-pay

## 2020-12-02 VITALS — BP 122/74 | HR 73 | Temp 97.0°F | Resp 20 | Ht 67.53 in | Wt 201.0 lb

## 2020-12-02 DIAGNOSIS — D508 Other iron deficiency anemias: Secondary | ICD-10-CM | POA: Diagnosis not present

## 2020-12-02 DIAGNOSIS — C73 Malignant neoplasm of thyroid gland: Secondary | ICD-10-CM | POA: Diagnosis not present

## 2020-12-02 DIAGNOSIS — Z79899 Other long term (current) drug therapy: Secondary | ICD-10-CM | POA: Diagnosis not present

## 2020-12-02 DIAGNOSIS — D472 Monoclonal gammopathy: Secondary | ICD-10-CM | POA: Diagnosis not present

## 2020-12-02 DIAGNOSIS — D509 Iron deficiency anemia, unspecified: Secondary | ICD-10-CM | POA: Diagnosis not present

## 2020-12-02 NOTE — Progress Notes (Signed)
[name]  Patient Care Team: Adin Hector, MD as PCP - General (Internal Medicine) Cammie Sickle, MD as Medical Oncologist (Hematology and Oncology)  Cancer Staging No matching staging information was found for the patient.   Oncology History Overview Note  1999- right thyroid lobectomy by Dr. Orene Desanctis with unusual poorly differentiated cancer, no additional treatment  2015- recurrent mass in thyroid bed. PET-CT showed FDG avid lesion in thyroid bed + nasopharyx.  2015- Dr. 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 Margins not noted [facility], [place], #[id number]).   01/05/2014- Nasopharyngeal biopsy negative for malignancy  04/25/14- completed postoperative XRT completing 65.25 CGy in 29 fractions  05/2014 + 07/2014- PET avidity at the left nasopharynx and left oropharyngeal soft tissue as well as a hypermetabolic 1.5 cm mass in the right thyroid bed. Dr Johney Frame excised the paratracheal mass on 09/02/2014. Inspect of the nasopharynx at the time of surgery revealed no obvious abnormality and no biopsy was completed.   09/2014- NP avid area biopsied and negative for carcinoma. SHe underwent a thyroid FNA and that was negative for cancer.  # Chronic Anemia- IDA [ EGD- hiatal hernia; Jan 2017- Dr.Rein] AUG 2017- IV Venofer  # AUG 2017- Thyroglobulin 40/Sep 2017 US neck- NED  # Hoarseness of voice- sec to RT [Dr.Bennett; Dec 2017]  # AUG 2017- IgG kappa 0.4gm//N-kappa-Lamda light chains     Malignant neoplasm of thyroid gland ([place])  09/01/2014 Initial Diagnosis   Malignant neoplasm of thyroid gland ([place])      INTERVAL HISTORY:  Sara Fields 80 y.o.  female pleasant patient above history of thyroid cancer; and  MGUS; diagnosis of iron deficiency anemia unclear etiology is  here for follow-up.   Patient denies any worsening pain or stiffness in the neck.  Denies any lumps in the neck.   Appetite is good.  No weight loss but no nausea vomiting.  No blood in stools or black or stools.  Review of Systems  Constitutional: Positive for malaise/fatigue. Negative for chills, diaphoresis, fever and weight loss.  HENT: Negative for nosebleeds and sore throat.   Eyes: Negative for double vision.  Respiratory: Negative for cough, hemoptysis, sputum production, shortness of breath and wheezing.   Cardiovascular: Negative for chest pain, palpitations, orthopnea and leg swelling.  Gastrointestinal: Negative for abdominal pain, blood in stool, constipation, diarrhea, heartburn, melena, nausea and vomiting.  Genitourinary: Negative for dysuria, frequency and urgency.  Musculoskeletal: Positive for neck pain. Negative for back pain and joint pain.  Skin: Negative.  Negative for itching and rash.  Neurological: Negative for dizziness, tingling, focal weakness, weakness and headaches.  Endo/Heme/Allergies: Does not bruise/bleed easily.  Psychiatric/Behavioral: Negative for depression. The patient is not nervous/anxious and does not have insomnia.      PAST MEDICAL HISTORY :  Past Medical History:  Diagnosis Date  . Anemia   . Asthma   . GERD (gastroesophageal reflux disease)   . Hyperlipidemia   . Hypertension   . Thyroid cancer ([place]) 1999 and 2015   Partial thyroidectomy with rad tx's.     PAST SURGICAL HISTORY :   Past Surgical History:  Procedure Laterality Date  . ABDOMINAL HYSTERECTOMY  1973   partial  . ESOPHAGOGASTRODUODENOSCOPY N/A 02/05/2017   Procedure: ESOPHAGOGASTRODUODENOSCOPY (EGD);  Surgeon: Wilford Corner, MD;  Location: [facility] ENDOSCOPY;  Service: Endoscopy;  Laterality: N/A;  . ESOPHAGOGASTRODUODENOSCOPY (EGD) WITH PROPOFOL N/A 11/16/2015   Procedure: ESOPHAGOGASTRODUODENOSCOPY (EGD) WITH PROPOFOL;  Surgeon: Josefine Class, MD;  Location:  [facility] ENDOSCOPY;  Service: Endoscopy;  Laterality: N/A;  . THYROID SURGERY  1999 and 2015   Partial Thyroidectomy    FAMILY HISTORY :   Family History  Problem Relation Age of Onset  . Lung cancer Father   . Hypertension Father   . Bone cancer Sister   . Hypertension Sister   . Lung cancer Brother   . Hypertension Brother   . Kidney cancer Sister   . Hypertension Sister   . Lung cancer Sister   . Hypertension Sister   . Breast cancer Neg Hx     SOCIAL HISTORY:   Social History   Tobacco Use  . Smoking status: Never Smoker  . Smokeless tobacco: Never Used  Substance Use Topics  . Alcohol use: No  . Drug use: No    ALLERGIES:  has No Known Allergies.  MEDICATIONS:  Current Outpatient Medications  Medication Sig Dispense Refill  . albuterol (PROVENTIL HFA;VENTOLIN HFA) 108 (90 Base) MCG/ACT inhaler Inhale into the lungs every 6 (six) hours as needed for wheezing or shortness of breath.    . allopurinol (ZYLOPRIM) 100 MG tablet Take 100 mg by mouth daily.    Marland Kitchen amLODipine (NORVASC) 5 MG tablet Take 1 tablet by mouth.    . bumetanide (BUMEX) 1 MG tablet Take 1 mg by mouth daily.    . calcium carbonate (OS-CAL) 600 MG TABS tablet Take 600 mg by mouth 2 (two) times daily with a meal.    . ipratropium-albuterol (DUONEB) 0.5-2.5 (3) MG/3ML SOLN Inhale 3 mLs into the lungs in the morning, at noon, in the evening, and at bedtime.    Marland Kitchen levothyroxine (SYNTHROID) 75 MCG tablet Take 75 mcg by mouth daily before breakfast.    . LORazepam (ATIVAN) 0.5 MG tablet Take 0.5 mg by mouth every 8 (eight) hours as needed for anxiety.     . meclizine (ANTIVERT) 12.5 MG tablet Take 12.5 mg by mouth 2 (two) times daily as needed for dizziness.     . montelukast (SINGULAIR) 10 MG tablet Take 10 mg by mouth at bedtime.    . pantoprazole (PROTONIX) 40 MG tablet Take 1 tablet (40 mg total) by mouth 2 (two) times daily before a meal. 60 tablet 1  . potassium chloride SA (KLOR-CON) 20 MEQ tablet Take 20  mEq by mouth daily.     . propranolol (INDERAL) 20 MG tablet Take 20 mg by mouth 3 (three) times daily.    . rosuvastatin (CRESTOR) 10 MG tablet Take 10 mg by mouth daily.     Marland Kitchen azelastine (ASTELIN) 0.1 % nasal spray Place 1 spray into both nostrils daily as needed for rhinitis or allergies.  (Patient not taking: Reported on 12/02/2020)    . traMADol (ULTRAM) 50 MG tablet Take 50 mg by mouth every 6 (six) hours as needed. (Patient not taking: Reported on 12/02/2020)     No current facility-administered medications for this visit.   Facility-Administered Medications Ordered in Other Visits  Medication Dose Route Frequency Provider Last Rate Last Admin  . 0.9 %  sodium chloride infusion   Intravenous Once Charlaine Dalton R, MD      . iron sucrose (VENOFER) 200 mg IVPB  200 mg Intravenous Once Cammie Sickle, MD        PHYSICAL EXAMINATION: ECOG PERFORMANCE STATUS:  BP 122/74   Pulse 73   Temp (!) 97 F (36.1 C) (Tympanic)   Resp 20   Ht 5' 7.53" (1.715 m)   Wt 201 lb (91.2 kg)   SpO2 99%   BMI 30.99 kg/m   Filed Weights   12/02/20 1300  Weight: 201 lb (91.2 kg)   Physical Exam Constitutional:      Comments: Alone.  Ambulating with a cane.  HENT:     Head: Normocephalic and atraumatic.     Mouth/Throat:     Pharynx: No oropharyngeal exudate.  Eyes:     Pupils: Pupils are equal, round, and reactive to light.  Neck:     Comments: Chronic scarring changes noted in the neck bilaterally.  No swelling noted. Cardiovascular:     Rate and Rhythm: Normal rate and regular rhythm.  Pulmonary:     Effort: Pulmonary effort is normal. No respiratory distress.     Breath sounds: Normal breath sounds. No wheezing.  Abdominal:     General: Bowel sounds are normal. There is no distension.     Palpations: Abdomen is soft. There is no mass.     Tenderness: There is no abdominal tenderness. There is no guarding or rebound.  Musculoskeletal:        General: No tenderness. Normal  range of motion.     Cervical back: Normal range of motion and neck supple.  Skin:    General: Skin is warm.  Neurological:     Mental Status: She is alert and oriented to person, place, and time.  Psychiatric:        Mood and Affect: Affect normal.      LABORATORY DATA:  I have reviewed the data as listed    Component Value Date/Time   NA 137 11/26/2020 1402   K 4.2 11/26/2020 1402   CL 105 11/26/2020 1402   CO2 25 11/26/2020 1402   GLUCOSE 116 (H) 11/26/2020 1402   BUN 35 (H) 11/26/2020 1402   CREATININE 1.12 (H) 11/26/2020 1402   CALCIUM 8.8 (L) 11/26/2020 1402   PROT 7.8 05/03/2019 1113   ALBUMIN 4.2 05/03/2019 1113   AST 23 05/03/2019 1113   ALT 13 05/03/2019 1113   ALKPHOS 88 05/03/2019 1113   BILITOT 0.7 05/03/2019 1113   GFRNONAA 50 (L) 11/26/2020 1402   GFRAA 58 (L) 05/26/2020 1358    No results found for: SPEP, UPEP  Lab Results  Component Value Date   WBC 8.5 11/26/2020   NEUTROABS 6.4 11/26/2020   HGB 12.2 11/26/2020   HCT 36.9 11/26/2020   MCV 94.9 11/26/2020   PLT 340 11/26/2020      Chemistry      Component Value Date/Time   NA 137 11/26/2020 1402   K 4.2 11/26/2020 1402   CL 105 11/26/2020 1402   CO2 25 11/26/2020 1402   BUN 35 (H) 11/26/2020 1402   CREATININE 1.12 (H) 11/26/2020 1402      Component Value Date/Time   CALCIUM 8.8 (L) 11/26/2020 1402   ALKPHOS 88 05/03/2019 1113   AST 23 05/03/2019 1113   ALT 13 05/03/2019 1113   BILITOT 0.7 05/03/2019 1113       RADIOGRAPHIC STUDIES: I have personally reviewed the radiological images as listed and agreed with the findings in the report. No results found.   ASSESSMENT & PLAN:  Malignant neoplasm of thyroid gland ([place]) # Thyroid ca with local reurence s/p surgery [[facility]; EBRT [2015; no RAI].  No clinical no evidence  of recurrence.  TSH 0.75; Thyroglobulin.0; STABLE. continue Synthroid 75 mcg a day.  #Iron deficient anemia-unclear etiology hemoglobin- STABLE 12.2; STABLE; sat-19%;     Continue p.o. iron.  Hold off IV iron infusion.  # WCHE-NID 7824- IgGKappa-0.6 [prior 0.3-0.6] g/dL; kappa lambda light chain ratio normal.  STABLE.   # DISPOSITION: # NO IV venofer infusion. # follow up 6 months- MD; Reva Bores- 1 week prior-  cbc/bmp-possible venofer; thyroid tumor marker/ Thyroid profile; iron studies/ferritin; MM l=panel; K/L light chains-Dr.B  Cc; Dr.Klein.    Orders Placed This Encounter  Procedures  . CBC with Differential    Standing Status:   Future    Standing Expiration Date:   12/02/2021  . Basic metabolic panel    Standing Status:   Future    Standing Expiration Date:   12/02/2021  . Ferritin    Standing Status:   Future    Standing Expiration Date:   12/02/2021  . Iron and TIBC    Standing Status:   Future    Standing Expiration Date:   12/02/2021  . TgAb+Thyroglobulin IMA or RIA    Standing Status:   Future    Standing Expiration Date:   12/02/2021  . Thyroid Panel With TSH    Standing Status:   Future    Standing Expiration Date:   12/02/2021  . Multiple Myeloma Panel (SPEP&IFE w/QIG)    Standing Status:   Future    Standing Expiration Date:   12/02/2021  . Kappa/lambda light chains    Standing Status:   Future    Standing Expiration Date:   12/02/2021   All questions were answered. The patient knows to call the clinic with any problems, questions or concerns.     Cammie Sickle, MD 12/02/2020 2:19 PM

## 2020-12-02 NOTE — Assessment & Plan Note (Addendum)
#   Thyroid ca with local reurence s/p surgery [[facility]; EBRT [2015; no RAI].  No clinical no evidence of recurrence.  TSH 0.75; Thyroglobulin.0; STABLE. continue Synthroid 75 mcg a day.  #Iron deficient anemia-unclear etiology hemoglobin- STABLE 12.2; STABLE; sat-19%;    Continue p.o. iron.  Hold off IV iron infusion.  # RPZP-SUG 6484- IgGKappa-0.6 [prior 0.3-0.6] g/dL; kappa lambda light chain ratio normal.  STABLE.   # DISPOSITION: # NO IV venofer infusion. # follow up 6 months- MD; Reva Bores- 1 week prior-  cbc/bmp-possible venofer; thyroid tumor marker/ Thyroid profile; iron studies/ferritin; MM l=panel; K/L light chains-Dr.B  Cc; Dr.Klein.

## 2020-12-18 DIAGNOSIS — D472 Monoclonal gammopathy: Secondary | ICD-10-CM | POA: Diagnosis not present

## 2020-12-18 DIAGNOSIS — Z0001 Encounter for general adult medical examination with abnormal findings: Secondary | ICD-10-CM | POA: Diagnosis not present

## 2020-12-18 DIAGNOSIS — E785 Hyperlipidemia, unspecified: Secondary | ICD-10-CM | POA: Diagnosis not present

## 2020-12-18 DIAGNOSIS — E538 Deficiency of other specified B group vitamins: Secondary | ICD-10-CM | POA: Diagnosis not present

## 2020-12-18 DIAGNOSIS — J449 Chronic obstructive pulmonary disease, unspecified: Secondary | ICD-10-CM | POA: Diagnosis not present

## 2020-12-18 DIAGNOSIS — N1831 Chronic kidney disease, stage 3a: Secondary | ICD-10-CM | POA: Diagnosis not present

## 2020-12-18 DIAGNOSIS — M109 Gout, unspecified: Secondary | ICD-10-CM | POA: Diagnosis not present

## 2020-12-18 DIAGNOSIS — I10 Essential (primary) hypertension: Secondary | ICD-10-CM | POA: Diagnosis not present

## 2020-12-18 DIAGNOSIS — M5090 Cervical disc disorder, unspecified, unspecified cervical region: Secondary | ICD-10-CM | POA: Diagnosis not present

## 2020-12-18 DIAGNOSIS — Z0000 Encounter for general adult medical examination without abnormal findings: Secondary | ICD-10-CM | POA: Diagnosis not present

## 2020-12-18 DIAGNOSIS — K219 Gastro-esophageal reflux disease without esophagitis: Secondary | ICD-10-CM | POA: Diagnosis not present

## 2020-12-18 DIAGNOSIS — M349 Systemic sclerosis, unspecified: Secondary | ICD-10-CM | POA: Diagnosis not present

## 2020-12-18 DIAGNOSIS — C73 Malignant neoplasm of thyroid gland: Secondary | ICD-10-CM | POA: Diagnosis not present

## 2020-12-18 DIAGNOSIS — J439 Emphysema, unspecified: Secondary | ICD-10-CM | POA: Diagnosis not present

## 2020-12-18 DIAGNOSIS — N183 Chronic kidney disease, stage 3 unspecified: Secondary | ICD-10-CM | POA: Diagnosis not present

## 2020-12-18 DIAGNOSIS — J8410 Pulmonary fibrosis, unspecified: Secondary | ICD-10-CM | POA: Diagnosis not present

## 2020-12-18 DIAGNOSIS — I129 Hypertensive chronic kidney disease with stage 1 through stage 4 chronic kidney disease, or unspecified chronic kidney disease: Secondary | ICD-10-CM | POA: Diagnosis not present

## 2020-12-18 DIAGNOSIS — Z79891 Long term (current) use of opiate analgesic: Secondary | ICD-10-CM | POA: Diagnosis not present

## 2020-12-18 DIAGNOSIS — D649 Anemia, unspecified: Secondary | ICD-10-CM | POA: Diagnosis not present

## 2020-12-21 ENCOUNTER — Other Ambulatory Visit: Payer: Self-pay | Admitting: Internal Medicine

## 2020-12-21 DIAGNOSIS — J84112 Idiopathic pulmonary fibrosis: Secondary | ICD-10-CM | POA: Diagnosis not present

## 2020-12-21 DIAGNOSIS — J449 Chronic obstructive pulmonary disease, unspecified: Secondary | ICD-10-CM | POA: Diagnosis not present

## 2020-12-21 DIAGNOSIS — Z1231 Encounter for screening mammogram for malignant neoplasm of breast: Secondary | ICD-10-CM

## 2020-12-21 DIAGNOSIS — R0600 Dyspnea, unspecified: Secondary | ICD-10-CM | POA: Diagnosis not present

## 2020-12-23 DIAGNOSIS — E349 Endocrine disorder, unspecified: Secondary | ICD-10-CM | POA: Diagnosis not present

## 2021-01-06 ENCOUNTER — Ambulatory Visit
Admission: RE | Admit: 2021-01-06 | Discharge: 2021-01-06 | Disposition: A | Discharge status: Home / Self Care | Payer: Medicare HMO | Source: Ambulatory Visit | Attending: Internal Medicine | Admitting: Internal Medicine

## 2021-01-06 ENCOUNTER — Other Ambulatory Visit: Payer: Self-pay

## 2021-01-06 DIAGNOSIS — Z1231 Encounter for screening mammogram for malignant neoplasm of breast: Secondary | ICD-10-CM

## 2021-01-12 ENCOUNTER — Other Ambulatory Visit: Payer: Self-pay | Admitting: Internal Medicine

## 2021-01-12 DIAGNOSIS — N6320 Unspecified lump in the left breast, unspecified quadrant: Secondary | ICD-10-CM

## 2021-01-12 DIAGNOSIS — R928 Other abnormal and inconclusive findings on diagnostic imaging of breast: Secondary | ICD-10-CM

## 2021-01-19 ENCOUNTER — Ambulatory Visit
Admission: RE | Admit: 2021-01-19 | Discharge: 2021-01-19 | Disposition: A | Discharge status: Home / Self Care | Payer: Medicare HMO | Source: Ambulatory Visit | Attending: Internal Medicine | Admitting: Internal Medicine

## 2021-01-19 ENCOUNTER — Other Ambulatory Visit: Payer: Self-pay

## 2021-01-19 DIAGNOSIS — R928 Other abnormal and inconclusive findings on diagnostic imaging of breast: Secondary | ICD-10-CM | POA: Diagnosis not present

## 2021-01-19 DIAGNOSIS — N6320 Unspecified lump in the left breast, unspecified quadrant: Secondary | ICD-10-CM

## 2021-01-20 ENCOUNTER — Other Ambulatory Visit: Payer: Self-pay | Admitting: Internal Medicine

## 2021-01-20 DIAGNOSIS — N6320 Unspecified lump in the left breast, unspecified quadrant: Secondary | ICD-10-CM

## 2021-03-17 DIAGNOSIS — M79672 Pain in left foot: Secondary | ICD-10-CM | POA: Diagnosis not present

## 2021-03-17 DIAGNOSIS — R6889 Other general symptoms and signs: Secondary | ICD-10-CM | POA: Diagnosis not present

## 2021-03-17 DIAGNOSIS — N1831 Chronic kidney disease, stage 3a: Secondary | ICD-10-CM | POA: Diagnosis not present

## 2021-03-17 DIAGNOSIS — I129 Hypertensive chronic kidney disease with stage 1 through stage 4 chronic kidney disease, or unspecified chronic kidney disease: Secondary | ICD-10-CM | POA: Diagnosis not present

## 2021-03-17 DIAGNOSIS — J449 Chronic obstructive pulmonary disease, unspecified: Secondary | ICD-10-CM | POA: Diagnosis not present

## 2021-03-17 DIAGNOSIS — J8410 Pulmonary fibrosis, unspecified: Secondary | ICD-10-CM | POA: Diagnosis not present

## 2021-04-12 DIAGNOSIS — M2012 Hallux valgus (acquired), left foot: Secondary | ICD-10-CM | POA: Diagnosis not present

## 2021-04-12 DIAGNOSIS — M2042 Other hammer toe(s) (acquired), left foot: Secondary | ICD-10-CM | POA: Diagnosis not present

## 2021-05-12 DIAGNOSIS — K529 Noninfective gastroenteritis and colitis, unspecified: Secondary | ICD-10-CM | POA: Diagnosis not present

## 2021-05-13 DIAGNOSIS — K529 Noninfective gastroenteritis and colitis, unspecified: Secondary | ICD-10-CM | POA: Diagnosis not present

## 2021-05-20 DIAGNOSIS — U071 COVID-19: Secondary | ICD-10-CM | POA: Diagnosis not present

## 2021-05-26 ENCOUNTER — Other Ambulatory Visit: Payer: Self-pay

## 2021-05-26 ENCOUNTER — Inpatient Hospital Stay: Payer: Medicare HMO | Attending: Internal Medicine

## 2021-05-26 DIAGNOSIS — D509 Iron deficiency anemia, unspecified: Secondary | ICD-10-CM | POA: Diagnosis not present

## 2021-05-26 DIAGNOSIS — D472 Monoclonal gammopathy: Secondary | ICD-10-CM | POA: Diagnosis not present

## 2021-05-26 DIAGNOSIS — D508 Other iron deficiency anemias: Secondary | ICD-10-CM

## 2021-05-26 DIAGNOSIS — C73 Malignant neoplasm of thyroid gland: Secondary | ICD-10-CM | POA: Diagnosis not present

## 2021-05-26 DIAGNOSIS — Z79899 Other long term (current) drug therapy: Secondary | ICD-10-CM | POA: Insufficient documentation

## 2021-05-26 LAB — CBC WITH DIFFERENTIAL/PLATELET
Abs Immature Granulocytes: 0.03 10*3/uL (ref 0.00–0.07)
Basophils Absolute: 0 10*3/uL (ref 0.0–0.1)
Basophils Relative: 1 %
Eosinophils Absolute: 0.1 10*3/uL (ref 0.0–0.5)
Eosinophils Relative: 1 %
HCT: 37.3 % (ref 36.0–46.0)
Hemoglobin: 11.9 g/dL — ABNORMAL LOW (ref 12.0–15.0)
Immature Granulocytes: 1 %
Lymphocytes Relative: 14 %
Lymphs Abs: 0.9 10*3/uL (ref 0.7–4.0)
MCH: 30.5 pg (ref 26.0–34.0)
MCHC: 31.9 g/dL (ref 30.0–36.0)
MCV: 95.6 fL (ref 80.0–100.0)
Monocytes Absolute: 0.6 10*3/uL (ref 0.1–1.0)
Monocytes Relative: 9 %
Neutro Abs: 4.4 10*3/uL (ref 1.7–7.7)
Neutrophils Relative %: 74 %
Platelets: 319 10*3/uL (ref 150–400)
RBC: 3.9 MIL/uL (ref 3.87–5.11)
RDW: 13.8 % (ref 11.5–15.5)
WBC: 6 10*3/uL (ref 4.0–10.5)
nRBC: 0 % (ref 0.0–0.2)

## 2021-05-26 LAB — BASIC METABOLIC PANEL
Anion gap: 7 (ref 5–15)
BUN: 37 mg/dL — ABNORMAL HIGH (ref 8–23)
CO2: 24 mmol/L (ref 22–32)
Calcium: 9.1 mg/dL (ref 8.9–10.3)
Chloride: 102 mmol/L (ref 98–111)
Creatinine, Ser: 1.38 mg/dL — ABNORMAL HIGH (ref 0.44–1.00)
GFR, Estimated: 39 mL/min — ABNORMAL LOW (ref >60)
Glucose, Bld: 105 mg/dL — ABNORMAL HIGH (ref 70–99)
Potassium: 3.9 mmol/L (ref 3.5–5.1)
Sodium: 133 mmol/L — ABNORMAL LOW (ref 135–145)

## 2021-05-26 LAB — IRON AND TIBC
Iron: 51 ug/dL (ref 28–170)
Saturation Ratios: 19 % (ref 10.4–31.8)
TIBC: 265 ug/dL (ref 250–450)
UIBC: 214 ug/dL

## 2021-05-26 LAB — FERRITIN: Ferritin: 515 ng/mL — ABNORMAL HIGH (ref 11–307)

## 2021-05-27 LAB — THYROID PANEL WITH TSH
Free Thyroxine Index: 2.9 (ref 1.2–4.9)
T3 Uptake Ratio: 31 % (ref 24–39)
T4, Total: 9.4 ug/dL (ref 4.5–12.0)
TSH: 0.232 u[IU]/mL — ABNORMAL LOW (ref 0.450–4.500)

## 2021-05-27 LAB — KAPPA/LAMBDA LIGHT CHAINS
Kappa free light chain: 67.1 mg/L — ABNORMAL HIGH (ref 3.3–19.4)
Kappa, lambda light chain ratio: 1.93 — ABNORMAL HIGH (ref 0.26–1.65)
Lambda free light chains: 34.7 mg/L — ABNORMAL HIGH (ref 5.7–26.3)

## 2021-05-27 LAB — TGAB+THYROGLOBULIN IMA OR RIA: Thyroglobulin Antibody: <1 IU/mL (ref 0.0–0.9)

## 2021-05-27 LAB — THYROGLOBULIN BY IMA: Thyroglobulin by IMA: 4 ng/mL (ref 1.5–38.5)

## 2021-05-28 LAB — MULTIPLE MYELOMA PANEL, SERUM
Albumin SerPl Elph-Mcnc: 3.7 g/dL (ref 2.9–4.4)
Albumin/Glob SerPl: 1.1 (ref 0.7–1.7)
Alpha 1: 0.2 g/dL (ref 0.0–0.4)
Alpha2 Glob SerPl Elph-Mcnc: 1 g/dL (ref 0.4–1.0)
B-Globulin SerPl Elph-Mcnc: 0.9 g/dL (ref 0.7–1.3)
Gamma Glob SerPl Elph-Mcnc: 1.3 g/dL (ref 0.4–1.8)
Globulin, Total: 3.5 g/dL (ref 2.2–3.9)
IgA: 251 mg/dL (ref 64–422)
IgG (Immunoglobin G), Serum: 1349 mg/dL (ref 586–1602)
IgM (Immunoglobulin M), Srm: 167 mg/dL (ref 26–217)
M Protein SerPl Elph-Mcnc: 0.6 g/dL — ABNORMAL HIGH
Total Protein ELP: 7.2 g/dL (ref 6.0–8.5)

## 2021-06-02 ENCOUNTER — Encounter: Payer: Self-pay | Admitting: Internal Medicine

## 2021-06-02 ENCOUNTER — Inpatient Hospital Stay: Payer: Medicare HMO | Admitting: Internal Medicine

## 2021-06-02 DIAGNOSIS — D509 Iron deficiency anemia, unspecified: Secondary | ICD-10-CM | POA: Diagnosis not present

## 2021-06-02 DIAGNOSIS — Z79899 Other long term (current) drug therapy: Secondary | ICD-10-CM | POA: Diagnosis not present

## 2021-06-02 DIAGNOSIS — C73 Malignant neoplasm of thyroid gland: Secondary | ICD-10-CM | POA: Diagnosis not present

## 2021-06-02 DIAGNOSIS — D472 Monoclonal gammopathy: Secondary | ICD-10-CM | POA: Diagnosis not present

## 2021-06-02 NOTE — Progress Notes (Signed)
[place] OFFICE PROGRESS NOTE  Patient Care Team: Adin Hector, MD as PCP - General (Internal Medicine) Cammie Sickle, MD as Medical Oncologist (Hematology and Oncology)  Cancer Staging No matching staging information was found for the patient.   Oncology History Overview Note  1999- right thyroid lobectomy by Dr. Orene Desanctis with unusual poorly differentiated cancer, no additional treatment  2015- recurrent mass in thyroid bed. PET-CT showed FDG avid lesion in thyroid bed + nasopharyx.  2015- Dr. 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 Margins not noted [facility], [place], #[id number]).   01/05/2014- Nasopharyngeal biopsy negative for malignancy  04/25/14- completed postoperative XRT completing 65.25 CGy in 29 fractions  05/2014 + 07/2014- PET avidity at the left nasopharynx and left oropharyngeal soft tissue as well as a hypermetabolic 1.5 cm mass in the right thyroid bed. Dr Johney Frame excised the paratracheal mass on 09/02/2014. Inspect of the nasopharynx at the time of surgery revealed no obvious abnormality and no biopsy was completed.   09/2014- NP avid area biopsied and negative for carcinoma. SHe underwent a thyroid FNA and that was negative for cancer.  # Chronic Anemia- IDA [ EGD- hiatal hernia; Jan 2017- Dr.Rein] AUG 2017- IV Venofer  # AUG 2017- Thyroglobulin 40/Sep 2017 US neck- NED  # Hoarseness of voice- sec to RT [Dr.Bennett; Dec 2017]  # AUG 2017- IgG kappa 0.4gm//N-kappa-Lamda light chains     Malignant neoplasm of thyroid gland ([place])  09/01/2014 Initial Diagnosis   Malignant neoplasm of thyroid gland ([place])       INTERVAL HISTORY:  Sara Fields 80 y.o.  female pleasant patient above history of thyroid cancer; and  MGUS; diagnosis of iron deficiency anemia unclear etiology is  here for follow-up.   Patient is emotionally distraught because of husband's recent diagnosis of colon cancer s/p colonoscopy.  Otherwise patient denies any new shortness of breath or cough he denies any worsening lumps or bumps in the neck.  No blood in stools no black or stools.   Review of Systems  Constitutional:  Positive for malaise/fatigue. Negative for chills, diaphoresis, fever and weight loss.  HENT:  Negative for nosebleeds and sore throat.   Eyes:  Negative for double vision.  Respiratory:  Negative for cough, hemoptysis, sputum production, shortness of breath and wheezing.   Cardiovascular:  Negative for chest pain, palpitations, orthopnea and leg swelling.  Gastrointestinal:  Negative for abdominal pain, blood in stool, constipation, diarrhea, heartburn, melena, nausea and vomiting.  Genitourinary:  Negative for dysuria, frequency and urgency.  Musculoskeletal:  Positive for neck pain. Negative for back pain and joint pain.  Skin: Negative.  Negative for itching and rash.  Neurological:  Negative for dizziness, tingling, focal weakness, weakness and headaches.  Endo/Heme/Allergies:  Does not bruise/bleed easily.  Psychiatric/Behavioral:  Negative for depression. The patient is not nervous/anxious and does not have insomnia.     PAST MEDICAL HISTORY :  Past Medical History:  Diagnosis Date  . Anemia   . Asthma   . GERD (gastroesophageal reflux disease)   . Hyperlipidemia   . Hypertension   . Thyroid cancer ([place]) 1999 and 2015   Partial thyroidectomy with rad tx's.     PAST SURGICAL HISTORY :   Past Surgical History:  Procedure Laterality Date  . ABDOMINAL HYSTERECTOMY  1973   partial  . ESOPHAGOGASTRODUODENOSCOPY  N/A 02/05/2017   Procedure: ESOPHAGOGASTRODUODENOSCOPY (EGD);  Surgeon: Wilford Corner, MD;  Location: [facility] ENDOSCOPY;  Service: Endoscopy;  Laterality: N/A;  . ESOPHAGOGASTRODUODENOSCOPY (EGD) WITH PROPOFOL N/A 11/16/2015   Procedure:  ESOPHAGOGASTRODUODENOSCOPY (EGD) WITH PROPOFOL;  Surgeon: Josefine Class, MD;  Location: [facility] ENDOSCOPY;  Service: Endoscopy;  Laterality: N/A;  . THYROID SURGERY  1999 and 2015   Partial Thyroidectomy    FAMILY HISTORY :   Family History  Problem Relation Age of Onset  . Lung cancer Father   . Hypertension Father   . Bone cancer Sister   . Hypertension Sister   . Lung cancer Brother   . Hypertension Brother   . Kidney cancer Sister   . Hypertension Sister   . Lung cancer Sister   . Hypertension Sister   . Breast cancer Neg Hx     SOCIAL HISTORY:   Social History   Tobacco Use  . Smoking status: Never  . Smokeless tobacco: Never  Substance Use Topics  . Alcohol use: No  . Drug use: No    ALLERGIES:  has No Known Allergies.  MEDICATIONS:  Current Outpatient Medications  Medication Sig Dispense Refill  . albuterol (PROVENTIL HFA;VENTOLIN HFA) 108 (90 Base) MCG/ACT inhaler Inhale into the lungs every 6 (six) hours as needed for wheezing or shortness of breath.    . allopurinol (ZYLOPRIM) 100 MG tablet Take 100 mg by mouth daily.    Marland Kitchen amLODipine (NORVASC) 5 MG tablet Take 1 tablet by mouth.    Marland Kitchen azelastine (ASTELIN) 0.1 % nasal spray Place 1 spray into both nostrils daily as needed for rhinitis or allergies.    . bumetanide (BUMEX) 1 MG tablet Take 1 mg by mouth daily.    . calcium carbonate (OS-CAL) 600 MG TABS tablet Take 600 mg by mouth 2 (two) times daily with a meal.    . ipratropium-albuterol (DUONEB) 0.5-2.5 (3) MG/3ML SOLN Inhale 3 mLs into the lungs in the morning, at noon, in the evening, and at bedtime.    Marland Kitchen levothyroxine (SYNTHROID) 88 MCG tablet TAKE 1 TABLET ONCE DAILY TAKE ON AN EMPTY STOMACH WITH A GLASS OF WATER AT LEAST 30-60 MINUTES BEFORE BREAKFAST    . LORazepam (ATIVAN) 0.5 MG tablet Take 0.5 mg by mouth every 8 (eight) hours as needed for anxiety.     . montelukast (SINGULAIR) 10 MG tablet Take 10 mg by mouth at bedtime.    . pantoprazole  (PROTONIX) 40 MG tablet Take 1 tablet (40 mg total) by mouth 2 (two) times daily before a meal. 60 tablet 1  . potassium chloride SA (KLOR-CON) 20 MEQ tablet Take 20 mEq by mouth daily.     . propranolol (INDERAL) 20 MG tablet Take 20 mg by mouth 3 (three) times daily.    . rosuvastatin (CRESTOR) 10 MG tablet Take 10 mg by mouth daily.     . traMADol (ULTRAM) 50 MG tablet Take 50 mg by mouth every 6 (six) hours as needed.    . meclizine (ANTIVERT) 12.5 MG tablet Take 12.5 mg by mouth 2 (two) times daily as needed for dizziness.  (Patient not taking: Reported on 06/02/2021)     No current facility-administered medications for this visit.   Facility-Administered Medications Ordered in Other Visits  Medication Dose Route Frequency Provider Last Rate Last Admin  . 0.9 %  sodium chloride infusion   Intravenous Once Charlaine Dalton R, MD      . iron sucrose (VENOFER) 200 mg IVPB  200  mg Intravenous Once Cammie Sickle, MD        PHYSICAL EXAMINATION: ECOG PERFORMANCE STATUS:   BP 124/68 (BP Location: Left Arm, Patient Position: Sitting, Cuff Size: Normal)   Pulse 80   Temp (!) 97.1 F (36.2 C) (Tympanic)   Resp 18   Ht 5' 7.73" (1.72 m)   Wt 194 lb 6.4 oz (88.2 kg)   SpO2 99%   BMI 29.79 kg/m   Filed Weights   06/02/21 1318  Weight: 194 lb 6.4 oz (88.2 kg)   Physical Exam Constitutional:      Comments: Alone.  Ambulating with a cane.  HENT:     Head: Normocephalic and atraumatic.     Mouth/Throat:     Pharynx: No oropharyngeal exudate.  Eyes:     Pupils: Pupils are equal, round, and reactive to light.  Neck:     Comments: Chronic scarring changes noted in the neck bilaterally.  No swelling noted. Cardiovascular:     Rate and Rhythm: Normal rate and regular rhythm.  Pulmonary:     Effort: Pulmonary effort is normal. No respiratory distress.     Breath sounds: Normal breath sounds. No wheezing.  Abdominal:     General: Bowel sounds are normal. There is no  distension.     Palpations: Abdomen is soft. There is no mass.     Tenderness: no abdominal tenderness There is no guarding or rebound.  Musculoskeletal:        General: No tenderness. Normal range of motion.     Cervical back: Normal range of motion and neck supple.  Skin:    General: Skin is warm.  Neurological:     Mental Status: She is alert and oriented to person, place, and time.  Psychiatric:        Mood and Affect: Affect normal.     LABORATORY DATA:  I have reviewed the data as listed    Component Value Date/Time   NA 133 (L) 05/26/2021 1345   K 3.9 05/26/2021 1345   CL 102 05/26/2021 1345   CO2 24 05/26/2021 1345   GLUCOSE 105 (H) 05/26/2021 1345   BUN 37 (H) 05/26/2021 1345   CREATININE 1.38 (H) 05/26/2021 1345   CALCIUM 9.1 05/26/2021 1345   PROT 7.8 05/03/2019 1113   ALBUMIN 4.2 05/03/2019 1113   AST 23 05/03/2019 1113   ALT 13 05/03/2019 1113   ALKPHOS 88 05/03/2019 1113   BILITOT 0.7 05/03/2019 1113   GFRNONAA 39 (L) 05/26/2021 1345   GFRAA 58 (L) 05/26/2020 1358    No results found for: SPEP, UPEP  Lab Results  Component Value Date   WBC 6.0 05/26/2021   NEUTROABS 4.4 05/26/2021   HGB 11.9 (L) 05/26/2021   HCT 37.3 05/26/2021   MCV 95.6 05/26/2021   PLT 319 05/26/2021      Chemistry      Component Value Date/Time   NA 133 (L) 05/26/2021 1345   K 3.9 05/26/2021 1345   CL 102 05/26/2021 1345   CO2 24 05/26/2021 1345   BUN 37 (H) 05/26/2021 1345   CREATININE 1.38 (H) 05/26/2021 1345      Component Value Date/Time   CALCIUM 9.1 05/26/2021 1345   ALKPHOS 88 05/03/2019 1113   AST 23 05/03/2019 1113   ALT 13 05/03/2019 1113   BILITOT 0.7 05/03/2019 1113       RADIOGRAPHIC STUDIES: I have personally reviewed the radiological images as listed and agreed with the findings in the report.  No results found.   ASSESSMENT & PLAN:  Malignant neoplasm of thyroid gland ([place]) # Thyroid ca with local reurence s/p surgery [[facility]; EBRT [2015; no  RAI].  No clinical no evidence of recurrence.  Thyroglobulin-normal/stable.  TSH - 0.2; continue current dose of Synthroid.   #Iron deficient anemia-unclear etiology hemoglobin- STABLE 11.9; STABLE; sat-19%;    Continue p.o. iron.  Hold off IV iron infusion.  # EQJC-SHR 9641- IgGKappa-0.6 [prior 0.3-0.6] g/dL; kappa lambda light chain ratio normal.  STABLE;  *colon ca-H # DISPOSITION: # NO IV venofer infusion. # follow up 6 months- MD; Reva Bores- 1 week prior-  cbc/bmp-possible venofer; thyroid tumor marker/ Thyroid profile; iron studies/ferritin; MM l=panel; K/L light chains-Dr.B  Cc; Dr.Klein.    Orders Placed This Encounter  Procedures  . CBC with Differential/Platelet    Standing Status:   Future    Standing Expiration Date:   06/02/2022  . Basic metabolic panel    Standing Status:   Future    Standing Expiration Date:   06/02/2022  . Ferritin    Standing Status:   Future    Standing Expiration Date:   06/02/2022  . Iron and TIBC    Standing Status:   Future    Standing Expiration Date:   06/02/2022  . Thyroid Panel With TSH    Standing Status:   Future    Standing Expiration Date:   06/02/2022  . Multiple Myeloma Panel (SPEP&IFE w/QIG)    Standing Status:   Future    Standing Expiration Date:   06/02/2022  . Kappa/lambda light chains    Standing Status:   Future    Standing Expiration Date:   06/02/2022   All questions were answered. The patient knows to call the clinic with any problems, questions or concerns.     Cammie Sickle, MD 06/02/2021 2:27 PM

## 2021-06-02 NOTE — Assessment & Plan Note (Addendum)
#   Thyroid ca with local reurence s/p surgery [[facility]; EBRT [2015; no RAI].  No clinical no evidence of recurrence.  Thyroglobulin-normal/stable.  TSH - 0.2; continue current dose of Synthroid.   #Iron deficient anemia-unclear etiology hemoglobin- STABLE 11.9; STABLE; sat-19%;    Continue p.o. iron.  Hold off IV iron infusion.  # [id number] IgGKappa-0.6 [prior 0.3-0.6] g/dL; kappa lambda light chain ratio normal.  STABLE;  *colon ca-H # DISPOSITION: # NO IV venofer infusion. # follow up 6 months- MD; Sara Fields- 1 week prior-  cbc/bmp-possible venofer; thyroid tumor marker/ Thyroid profile; iron studies/ferritin; MM l=panel; K/L light chains-Dr.B  Cc; Dr.Klein.

## 2021-06-10 DIAGNOSIS — Z79891 Long term (current) use of opiate analgesic: Secondary | ICD-10-CM | POA: Diagnosis not present

## 2021-06-10 DIAGNOSIS — I10 Essential (primary) hypertension: Secondary | ICD-10-CM | POA: Diagnosis not present

## 2021-06-10 DIAGNOSIS — J8410 Pulmonary fibrosis, unspecified: Secondary | ICD-10-CM | POA: Diagnosis not present

## 2021-06-10 DIAGNOSIS — D649 Anemia, unspecified: Secondary | ICD-10-CM | POA: Diagnosis not present

## 2021-06-10 DIAGNOSIS — E785 Hyperlipidemia, unspecified: Secondary | ICD-10-CM | POA: Diagnosis not present

## 2021-06-10 DIAGNOSIS — N1831 Chronic kidney disease, stage 3a: Secondary | ICD-10-CM | POA: Diagnosis not present

## 2021-06-10 DIAGNOSIS — M109 Gout, unspecified: Secondary | ICD-10-CM | POA: Diagnosis not present

## 2021-06-10 DIAGNOSIS — R0609 Other forms of dyspnea: Secondary | ICD-10-CM | POA: Diagnosis not present

## 2021-06-17 DIAGNOSIS — I10 Essential (primary) hypertension: Secondary | ICD-10-CM | POA: Diagnosis not present

## 2021-06-17 DIAGNOSIS — D472 Monoclonal gammopathy: Secondary | ICD-10-CM | POA: Diagnosis not present

## 2021-06-17 DIAGNOSIS — E538 Deficiency of other specified B group vitamins: Secondary | ICD-10-CM | POA: Diagnosis not present

## 2021-06-17 DIAGNOSIS — M109 Gout, unspecified: Secondary | ICD-10-CM | POA: Diagnosis not present

## 2021-06-17 DIAGNOSIS — C73 Malignant neoplasm of thyroid gland: Secondary | ICD-10-CM | POA: Diagnosis not present

## 2021-06-17 DIAGNOSIS — J984 Other disorders of lung: Secondary | ICD-10-CM | POA: Diagnosis not present

## 2021-06-17 DIAGNOSIS — J8410 Pulmonary fibrosis, unspecified: Secondary | ICD-10-CM | POA: Diagnosis not present

## 2021-06-17 DIAGNOSIS — D649 Anemia, unspecified: Secondary | ICD-10-CM | POA: Diagnosis not present

## 2021-06-17 DIAGNOSIS — R197 Diarrhea, unspecified: Secondary | ICD-10-CM | POA: Diagnosis not present

## 2021-06-17 DIAGNOSIS — J4520 Mild intermittent asthma, uncomplicated: Secondary | ICD-10-CM | POA: Diagnosis not present

## 2021-06-17 DIAGNOSIS — N1831 Chronic kidney disease, stage 3a: Secondary | ICD-10-CM | POA: Diagnosis not present

## 2021-07-20 ENCOUNTER — Telehealth: Payer: Self-pay | Admitting: Internal Medicine

## 2021-07-20 ENCOUNTER — Encounter: Payer: Self-pay | Admitting: Internal Medicine

## 2021-07-20 NOTE — Telephone Encounter (Signed)
I called patient's family and offered my condolences-for the passing away of her husband.  Patient very thankful for the call.

## 2021-07-26 ENCOUNTER — Ambulatory Visit
Admission: RE | Admit: 2021-07-26 | Discharge: 2021-07-26 | Disposition: A | Discharge status: Home / Self Care | Payer: Medicare HMO | Source: Ambulatory Visit | Attending: Internal Medicine | Admitting: Internal Medicine

## 2021-07-26 ENCOUNTER — Other Ambulatory Visit: Payer: Self-pay

## 2021-07-26 DIAGNOSIS — N6320 Unspecified lump in the left breast, unspecified quadrant: Secondary | ICD-10-CM | POA: Diagnosis present

## 2021-07-26 DIAGNOSIS — N6321 Unspecified lump in the left breast, upper outer quadrant: Secondary | ICD-10-CM | POA: Diagnosis not present

## 2021-07-26 DIAGNOSIS — R922 Inconclusive mammogram: Secondary | ICD-10-CM | POA: Diagnosis not present

## 2021-07-28 ENCOUNTER — Other Ambulatory Visit: Payer: Self-pay | Admitting: Internal Medicine

## 2021-07-28 DIAGNOSIS — Z1231 Encounter for screening mammogram for malignant neoplasm of breast: Secondary | ICD-10-CM

## 2021-07-28 DIAGNOSIS — N6320 Unspecified lump in the left breast, unspecified quadrant: Secondary | ICD-10-CM

## 2021-08-02 DIAGNOSIS — Z01818 Encounter for other preprocedural examination: Secondary | ICD-10-CM | POA: Diagnosis not present

## 2021-08-02 DIAGNOSIS — R053 Chronic cough: Secondary | ICD-10-CM | POA: Diagnosis not present

## 2021-08-03 DIAGNOSIS — R197 Diarrhea, unspecified: Secondary | ICD-10-CM | POA: Diagnosis not present

## 2021-08-03 DIAGNOSIS — K2100 Gastro-esophageal reflux disease with esophagitis, without bleeding: Secondary | ICD-10-CM | POA: Diagnosis not present

## 2021-08-03 DIAGNOSIS — R194 Change in bowel habit: Secondary | ICD-10-CM | POA: Diagnosis not present

## 2021-08-03 DIAGNOSIS — R1319 Other dysphagia: Secondary | ICD-10-CM | POA: Diagnosis not present

## 2021-08-04 DIAGNOSIS — R197 Diarrhea, unspecified: Secondary | ICD-10-CM | POA: Diagnosis not present

## 2021-09-07 ENCOUNTER — Encounter: Payer: Self-pay | Admitting: General Surgery

## 2021-09-08 ENCOUNTER — Encounter: Payer: Self-pay | Admitting: General Surgery

## 2021-09-08 ENCOUNTER — Ambulatory Visit: Payer: Medicare HMO | Admitting: Certified Registered"

## 2021-09-08 ENCOUNTER — Ambulatory Visit
Admission: RE | Admit: 2021-09-08 | Discharge: 2021-09-08 | Disposition: A | Discharge status: Home / Self Care | Payer: Medicare HMO | Attending: General Surgery | Admitting: General Surgery

## 2021-09-08 ENCOUNTER — Encounter
Admission: RE | Disposition: A | Discharge status: Home / Self Care | Payer: Self-pay | Source: Home / Self Care | Attending: General Surgery

## 2021-09-08 DIAGNOSIS — R194 Change in bowel habit: Secondary | ICD-10-CM | POA: Diagnosis not present

## 2021-09-08 DIAGNOSIS — K635 Polyp of colon: Secondary | ICD-10-CM | POA: Insufficient documentation

## 2021-09-08 DIAGNOSIS — R1310 Dysphagia, unspecified: Secondary | ICD-10-CM | POA: Diagnosis not present

## 2021-09-08 DIAGNOSIS — E785 Hyperlipidemia, unspecified: Secondary | ICD-10-CM | POA: Diagnosis not present

## 2021-09-08 DIAGNOSIS — R197 Diarrhea, unspecified: Secondary | ICD-10-CM | POA: Diagnosis not present

## 2021-09-08 DIAGNOSIS — Z85850 Personal history of malignant neoplasm of thyroid: Secondary | ICD-10-CM | POA: Diagnosis not present

## 2021-09-08 DIAGNOSIS — K449 Diaphragmatic hernia without obstruction or gangrene: Secondary | ICD-10-CM | POA: Diagnosis not present

## 2021-09-08 DIAGNOSIS — R159 Full incontinence of feces: Secondary | ICD-10-CM | POA: Insufficient documentation

## 2021-09-08 DIAGNOSIS — K6389 Other specified diseases of intestine: Secondary | ICD-10-CM | POA: Diagnosis not present

## 2021-09-08 DIAGNOSIS — I10 Essential (primary) hypertension: Secondary | ICD-10-CM | POA: Diagnosis not present

## 2021-09-08 DIAGNOSIS — D126 Benign neoplasm of colon, unspecified: Secondary | ICD-10-CM | POA: Diagnosis not present

## 2021-09-08 DIAGNOSIS — K219 Gastro-esophageal reflux disease without esophagitis: Secondary | ICD-10-CM | POA: Diagnosis not present

## 2021-09-08 DIAGNOSIS — R1319 Other dysphagia: Secondary | ICD-10-CM | POA: Diagnosis not present

## 2021-09-08 DIAGNOSIS — K5730 Diverticulosis of large intestine without perforation or abscess without bleeding: Secondary | ICD-10-CM | POA: Diagnosis not present

## 2021-09-08 DIAGNOSIS — J449 Chronic obstructive pulmonary disease, unspecified: Secondary | ICD-10-CM | POA: Diagnosis not present

## 2021-09-08 HISTORY — DX: Dyspnea, unspecified: R06.00

## 2021-09-08 HISTORY — PX: ESOPHAGOGASTRODUODENOSCOPY (EGD) WITH PROPOFOL: SHX5813

## 2021-09-08 HISTORY — PX: COLONOSCOPY WITH PROPOFOL: SHX5780

## 2021-09-08 HISTORY — DX: Chronic obstructive pulmonary disease, unspecified: J44.9

## 2021-09-08 SURGERY — COLONOSCOPY WITH PROPOFOL
Anesthesia: General

## 2021-09-08 MED ORDER — PHENYLEPHRINE HCL (PRESSORS) 10 MG/ML IV SOLN
INTRAVENOUS | Status: AC
  Filled 2021-09-08: qty 1

## 2021-09-08 MED ORDER — PROPOFOL 500 MG/50ML IV EMUL
INTRAVENOUS | Status: DC | PRN
  Administered 2021-09-08: 150 ug/kg/min via INTRAVENOUS

## 2021-09-08 MED ORDER — PROPOFOL 10 MG/ML IV BOLUS
INTRAVENOUS | Status: AC
  Filled 2021-09-08: qty 20

## 2021-09-08 MED ORDER — LIDOCAINE HCL (CARDIAC) PF 100 MG/5ML IV SOSY
PREFILLED_SYRINGE | INTRAVENOUS | Status: DC | PRN
  Administered 2021-09-08: 100 mg via INTRAVENOUS

## 2021-09-08 MED ORDER — PROPOFOL 10 MG/ML IV BOLUS
INTRAVENOUS | Status: DC | PRN
  Administered 2021-09-08: 130 mg via INTRAVENOUS

## 2021-09-08 MED ORDER — SODIUM CHLORIDE 0.9 % IV SOLN: INTRAVENOUS | Status: DC

## 2021-09-08 MED ORDER — PHENYLEPHRINE HCL (PRESSORS) 10 MG/ML IV SOLN
INTRAVENOUS | Status: DC | PRN
  Administered 2021-09-08: 100 ug via INTRAVENOUS

## 2021-09-08 NOTE — H&P (Signed)
Sara Fields [id number] 05/17/1941     HPI:  80y/o with a long history of dysphagia with occasional need to regurgitate food. Known large hiatal hernia. She is careful to eat slowly with plenty of liquids.  She has required dilatation in the past.  6-7 month history of loose stools, pudding like in nature.  Frequent episodes of incontinence of a tiny amount of feces, may occur at night time.  Fiber supplements did not provide relief.  If she uses Imodium, this "checks" the loose stools, but they return when it wears off.   For upper and lower endoscopy.   Medications Prior to Admission  Medication Sig Dispense Refill Last Dose   allopurinol (ZYLOPRIM) 100 MG tablet Take 100 mg by mouth daily.   09/07/2021   amLODipine (NORVASC) 5 MG tablet Take 1 tablet by mouth.   09/07/2021   bumetanide (BUMEX) 1 MG tablet Take 1 mg by mouth daily.   09/07/2021   ipratropium-albuterol (DUONEB) 0.5-2.5 (3) MG/3ML SOLN Inhale 3 mLs into the lungs in the morning, at noon, in the evening, and at bedtime.   09/07/2021   levothyroxine (SYNTHROID) 88 MCG tablet TAKE 1 TABLET ONCE DAILY TAKE ON AN EMPTY STOMACH WITH A GLASS OF WATER AT LEAST 30-60 MINUTES BEFORE BREAKFAST   09/08/2021   LORazepam (ATIVAN) 0.5 MG tablet Take 0.5 mg by mouth every 8 (eight) hours as needed for anxiety.    09/07/2021   montelukast (SINGULAIR) 10 MG tablet Take 10 mg by mouth at bedtime.   09/07/2021   pantoprazole (PROTONIX) 40 MG tablet Take 1 tablet (40 mg total) by mouth 2 (two) times daily before a meal. 60 tablet 1 09/07/2021   potassium chloride SA (KLOR-CON) 20 MEQ tablet Take 20 mEq by mouth daily.    09/07/2021   propranolol (INDERAL) 20 MG tablet Take 20 mg by mouth 3 (three) times daily.   09/07/2021   albuterol (PROVENTIL HFA;VENTOLIN HFA) 108 (90 Base) MCG/ACT inhaler Inhale into the lungs every 6 (six) hours as needed for wheezing or shortness of breath.      azelastine (ASTELIN) 0.1 % nasal spray Place 1 spray into both  nostrils daily as needed for rhinitis or allergies.      calcium carbonate (OS-CAL) 600 MG TABS tablet Take 600 mg by mouth 2 (two) times daily with a meal.      meclizine (ANTIVERT) 12.5 MG tablet Take 12.5 mg by mouth 2 (two) times daily as needed for dizziness.  (Patient not taking: Reported on 06/02/2021)      rosuvastatin (CRESTOR) 10 MG tablet Take 10 mg by mouth daily.       traMADol (ULTRAM) 50 MG tablet Take 50 mg by mouth every 6 (six) hours as needed.      No Known Allergies Past Medical History:  Diagnosis Date   Anemia    Asthma    COPD (chronic obstructive pulmonary disease) (HCC)    Dyspnea    GERD (gastroesophageal reflux disease)    Hyperlipidemia    Hypertension    Thyroid cancer ([place]) 1999 and 2015   Partial thyroidectomy with rad tx's.    Past Surgical History:  Procedure Laterality Date   ABDOMINAL HYSTERECTOMY  1973   partial   ESOPHAGOGASTRODUODENOSCOPY N/A 02/05/2017   Procedure: ESOPHAGOGASTRODUODENOSCOPY (EGD);  Surgeon: Wilford Corner, MD;  Location: [facility] ENDOSCOPY;  Service: Endoscopy;  Laterality: N/A;   ESOPHAGOGASTRODUODENOSCOPY (EGD) WITH PROPOFOL N/A 11/16/2015   Procedure: ESOPHAGOGASTRODUODENOSCOPY (EGD) WITH PROPOFOL;  Surgeon: Grace Blight  Rayann Heman, MD;  Location: [facility] ENDOSCOPY;  Service: Endoscopy;  Laterality: N/A;   THYROID SURGERY  1999 and 2015   Partial Thyroidectomy   Social History   Socioeconomic History   Marital status: Widowed    Spouse name: Not on file   Number of children: Not on file   Years of education: Not on file   Highest education level: Not on file  Occupational History   Not on file  Tobacco Use   Smoking status: Never   Smokeless tobacco: Never  Vaping Use   Vaping Use: Never used  Substance and Sexual Activity   Alcohol use: No   Drug use: No   Sexual activity: Not on file  Other Topics Concern   Not on file  Social History Narrative   Not on file   Social Determinants of Health   Financial Resource  Strain: Not on file  Food Insecurity: Not on file  Transportation Needs: Not on file  Physical Activity: Not on file  Stress: Not on file  Social Connections: Not on file  Intimate Partner Violence: Not on file   Social History   Social History Narrative   Not on file     ROS: Negative.  Endoscopic History: EGD 01/2017- demonstrated a large hiatal hernia, 2 gastric ulcers and cameron lesions (Dr Michail Sermon) EGD 2017- widely patent schatzki ring, la grade B esophagitis, large hiatal hernia EGD 2015- Dr Darleene Cleaver- shatzki ring that was dilated, large hiatal hernia, normal esophagus otherwise unremarkable Colonsocopy 09/13/10- diverticulosis, 5-10y repeat recommended   PE: HEENT: Negative. Lungs: Clear. Cardio: RRForest Gleason Devesh Monforte 09/08/2021   Assessment/Plan:  Proceed with planned upper and lower endoscopy.

## 2021-09-08 NOTE — Op Note (Addendum)
[facility] Gastroenterology Patient Name: Sara Fields Procedure Date: 09/08/2021 9:38 AM MRN: [id number] Account #: [id number] Date of Birth: 02-24-1941 Admit Type: Outpatient Age: 80 Room: [facility] ENDO ROOM 1 Gender: Female Note Status: Supervisor Override Instrument Name: Peds Colonoscope [id number] Procedure:             Colonoscopy Indications:           Clinically significant diarrhea of unexplained origin,                         Change in bowel habits Providers:             Robert Bellow, MD Medicines:             Propofol per Anesthesia Complications:         No immediate complications. Procedure:             Pre-Anesthesia Assessment:                        - Prior to the procedure, a History and Physical was                         performed, and patient medications, allergies and                         sensitivities were reviewed. The patient's tolerance                         of previous anesthesia was reviewed.                        - The risks and benefits of the procedure and the                         sedation options and risks were discussed with the                         patient. All questions were answered and informed                         consent was obtained.                        After obtaining informed consent, the colonoscope was                         passed under direct vision. Throughout the procedure,                         the patient's blood pressure, pulse, and oxygen                         saturations were monitored continuously. The                         Colonoscope was introduced through the anus and                         advanced to the the cecum, identified by appendiceal  orifice and ileocecal valve. The colonoscopy was                         somewhat difficult due to multiple diverticula in the                         colon. Successful completion of the procedure was                          aided by using manual pressure. The patient tolerated                         the procedure well. The quality of the bowel                         preparation was excellent. Findings:      Many medium-mouthed diverticula were found in the recto-sigmoid colon       and sigmoid colon. Multiple diverticuli lumen with inpacted fecal       content.      A 4 mm polyp was found in the cecum. The polyp was sessile. Biopsies       were taken with a cold forceps for histology.      The retroflexed view of the distal rectum and anal verge was normal and       showed no anal or rectal abnormalities. Impression:            - Diverticulosis in the recto-sigmoid colon and in the                         sigmoid colon.                        - One 4 mm polyp in the cecum. Biopsied.                        - The distal rectum and anal verge are normal on                         retroflexion view. Recommendation:        - Telephone endoscopist for pathology results in 1                         week. Procedure Code(s):     --- Professional ---                        [phone], Colonoscopy, flexible; with biopsy, single or                         multiple CPT copyright 2019 American Medical Association. All rights reserved. The codes documented in this report are preliminary and upon coder review may  be revised to meet current compliance requirements. Robert Bellow, MD 09/08/2021 10:40:31 AM This report has been signed electronically. Number of Addenda: 0 Note Initiated On: 09/08/2021 9:38 AM Scope Withdrawal Time: 0 hours 13 minutes 3 seconds  Total Procedure Duration: 0 hours 24 minutes 28 seconds  Estimated Blood Loss:  Estimated blood loss was minimal.      [facility]

## 2021-09-08 NOTE — Op Note (Addendum)
[facility] Gastroenterology Patient Name: Sara Fields Procedure Date: 09/08/2021 9:38 AM MRN: [id number] Account #: [id number] Date of Birth: June 07, 1941 Admit Type: Outpatient Age: 80 Room: [facility] ENDO ROOM 1 Gender: Female Note Status: Supervisor Override Instrument Name: Michaelle Birks [id number] Procedure:             Upper GI endoscopy Indications:           Dysphagia Providers:             Robert Bellow, MD Medicines:             Propofol per Anesthesia Complications:         No immediate complications. Procedure:             Pre-Anesthesia Assessment:                        - Prior to the procedure, a History and Physical was                         performed, and patient medications, allergies and                         sensitivities were reviewed. The patient's tolerance                         of previous anesthesia was reviewed.                        - The risks and benefits of the procedure and the                         sedation options and risks were discussed with the                         patient. All questions were answered and informed                         consent was obtained.                        After obtaining informed consent, the endoscope was                         passed under direct vision. Throughout the procedure,                         the patient's blood pressure, pulse, and oxygen                         saturations were monitored continuously. The Endoscope                         was introduced through the mouth, and advanced to the                         second part of duodenum. The upper GI endoscopy was                         accomplished without difficulty. The patient tolerated  the procedure well. Findings:      A large hiatal hernia was present.      The stomach was normal.      The examined duodenum was normal. Impression:            - Large hiatal hernia.                         - Normal stomach.                        - Normal examined duodenum.                        - No specimens collected. Recommendation:        - Perform a colonoscopy today. Procedure Code(s):     --- Professional ---                        [phone], Esophagogastroduodenoscopy, flexible,                         transoral; diagnostic, including collection of                         specimen(s) by brushing or washing, when performed                         (separate procedure) Diagnosis Code(s):     --- Professional ---                        K44.9, Diaphragmatic hernia without obstruction or                         gangrene CPT copyright 2019 American Medical Association. All rights reserved. The codes documented in this report are preliminary and upon coder review may  be revised to meet current compliance requirements. Robert Bellow, MD 09/08/2021 10:09:31 AM This report has been signed electronically. Number of Addenda: 0 Note Initiated On: 09/08/2021 9:38 AM Estimated Blood Loss:  Estimated blood loss: none.      [facility]

## 2021-09-08 NOTE — Anesthesia Procedure Notes (Addendum)
Procedure Name: MAC Date/Time: 09/08/2021 9:55 AM Performed by: Biagio Borg, CRNA Pre-anesthesia Checklist: Patient identified, Emergency Drugs available, Suction available, Patient being monitored and Timeout performed Patient Re-evaluated:Patient Re-evaluated prior to induction Oxygen Delivery Method: Nasal cannula Induction Type: IV induction Placement Confirmation: positive ETCO2 and CO2 detector

## 2021-09-08 NOTE — Transfer of Care (Signed)
Immediate Anesthesia Transfer of Care Note  Patient: Sara Fields  Procedure(s) Performed: COLONOSCOPY WITH PROPOFOL ESOPHAGOGASTRODUODENOSCOPY (EGD) WITH PROPOFOL  Patient Location: PACU and Endoscopy Unit  Anesthesia Type:General  Level of Consciousness: drowsy  Airway & Oxygen Therapy: Patient Spontanous Breathing  Post-op Assessment: Report given to RN and Post -op Vital signs reviewed and stable  Post vital signs: Reviewed and stable  Last Vitals:  Vitals Value Taken Time  BP 128/81 09/08/21 1040  Temp 35.7 C 09/08/21 1040  Pulse 124 09/08/21 1045  Resp 23 09/08/21 1045  SpO2 100 % 09/08/21 1045  Vitals shown include unvalidated device data.  Last Pain:  Vitals:   09/08/21 1040  TempSrc: Temporal         Complications: No notable events documented.

## 2021-09-08 NOTE — Anesthesia Postprocedure Evaluation (Signed)
Anesthesia Post Note  Patient: Sara Fields  Procedure(s) Performed: COLONOSCOPY WITH PROPOFOL ESOPHAGOGASTRODUODENOSCOPY (EGD) WITH PROPOFOL  Patient location during evaluation: Endoscopy Anesthesia Type: General Level of consciousness: awake and alert Pain management: pain level controlled Vital Signs Assessment: post-procedure vital signs reviewed and stable Respiratory status: spontaneous breathing, nonlabored ventilation, respiratory function stable and patient connected to nasal cannula oxygen Cardiovascular status: blood pressure returned to baseline and stable Postop Assessment: no apparent nausea or vomiting Anesthetic complications: no   No notable events documented.   Last Vitals:  Vitals:   09/08/21 1040 09/08/21 1050  BP: 128/81 137/86  Pulse: 73 82  Resp: 12 (!) 21  Temp: (!) 35.7 C   SpO2:  95%    Last Pain:  Vitals:   09/08/21 1050  TempSrc:   PainSc: 0-No pain                 Margaree Mackintosh

## 2021-09-08 NOTE — Anesthesia Preprocedure Evaluation (Signed)
Anesthesia Evaluation  Patient identified by MRN, date of birth, ID band Patient awake    Reviewed: Allergy & Precautions, NPO status , Patient's Chart, lab work & pertinent test results  Airway Mallampati: III  TM Distance: >3 FB Neck ROM: full    Dental  (+) Lower Dentures, Upper Dentures   Pulmonary neg pulmonary ROS,    Pulmonary exam normal        Cardiovascular hypertension, negative cardio ROS Normal cardiovascular exam     Neuro/Psych negative neurological ROS  negative psych ROS   GI/Hepatic negative GI ROS, Neg liver ROS,   Endo/Other  negative endocrine ROS  Renal/GU negative Renal ROS  negative genitourinary   Musculoskeletal   Abdominal   Peds  Hematology negative hematology ROS (+)   Anesthesia Other Findings Past Medical History: No date: Anemia No date: Asthma No date: COPD (chronic obstructive pulmonary disease) (HCC) No date: Dyspnea No date: GERD (gastroesophageal reflux disease) No date: Hyperlipidemia No date: Hypertension 1999 and 2015: Thyroid cancer ([place])     Comment:  Partial thyroidectomy with rad tx's.   Past Surgical History: 1973: ABDOMINAL HYSTERECTOMY     Comment:  partial 02/05/2017: ESOPHAGOGASTRODUODENOSCOPY; N/A     Comment:  Procedure: ESOPHAGOGASTRODUODENOSCOPY (EGD);  Surgeon:               Wilford Corner, MD;  Location: [facility] ENDOSCOPY;                Service: Endoscopy;  Laterality: N/A; 11/16/2015: ESOPHAGOGASTRODUODENOSCOPY (EGD) WITH PROPOFOL; N/A     Comment:  Procedure: ESOPHAGOGASTRODUODENOSCOPY (EGD) WITH               PROPOFOL;  Surgeon: Josefine Class, MD;  Location:               [facility] ENDOSCOPY;  Service: Endoscopy;  Laterality: N/A; 1999 and 2015: THYROID SURGERY     Comment:  Partial Thyroidectomy  BMI    Body Mass Index: 31.01 kg/m      Reproductive/Obstetrics negative OB ROS                             Anesthesia  Physical Anesthesia Plan  ASA: 3  Anesthesia Plan: General   Post-op Pain Management:    Induction: Intravenous  PONV Risk Score and Plan: Propofol infusion and TIVA  Airway Management Planned: Natural Airway and Nasal Cannula  Additional Equipment:   Intra-op Plan:   Post-operative Plan:   Informed Consent: I have reviewed the patients History and Physical, chart, labs and discussed the procedure including the risks, benefits and alternatives for the proposed anesthesia with the patient or authorized representative who has indicated his/her understanding and acceptance.     Dental Advisory Given  Plan Discussed with: Anesthesiologist, CRNA and Surgeon  Anesthesia Plan Comments: (Patient consented for risks of anesthesia including but not limited to:  - adverse reactions to medications - risk of airway placement if required - damage to eyes, teeth, lips or other oral mucosa - nerve damage due to positioning  - sore throat or hoarseness - Damage to heart, brain, nerves, lungs, other parts of body or loss of life  Patient voiced understanding.)        Anesthesia Quick Evaluation

## 2021-09-09 ENCOUNTER — Encounter: Payer: Self-pay | Admitting: General Surgery

## 2021-09-09 LAB — SURGICAL PATHOLOGY

## 2021-10-07 DIAGNOSIS — J4520 Mild intermittent asthma, uncomplicated: Secondary | ICD-10-CM | POA: Diagnosis not present

## 2021-10-07 DIAGNOSIS — J84112 Idiopathic pulmonary fibrosis: Secondary | ICD-10-CM | POA: Diagnosis not present

## 2021-10-07 DIAGNOSIS — Z23 Encounter for immunization: Secondary | ICD-10-CM | POA: Diagnosis not present

## 2021-10-07 DIAGNOSIS — R0609 Other forms of dyspnea: Secondary | ICD-10-CM | POA: Diagnosis not present

## 2021-10-07 DIAGNOSIS — R059 Cough, unspecified: Secondary | ICD-10-CM | POA: Diagnosis not present

## 2021-10-07 DIAGNOSIS — K449 Diaphragmatic hernia without obstruction or gangrene: Secondary | ICD-10-CM | POA: Diagnosis not present

## 2021-10-07 DIAGNOSIS — K219 Gastro-esophageal reflux disease without esophagitis: Secondary | ICD-10-CM | POA: Diagnosis not present

## 2021-11-01 DIAGNOSIS — R197 Diarrhea, unspecified: Secondary | ICD-10-CM | POA: Diagnosis not present

## 2021-11-01 DIAGNOSIS — D649 Anemia, unspecified: Secondary | ICD-10-CM | POA: Diagnosis not present

## 2021-11-05 ENCOUNTER — Other Ambulatory Visit: Payer: Self-pay | Admitting: Gastroenterology

## 2021-11-05 DIAGNOSIS — R197 Diarrhea, unspecified: Secondary | ICD-10-CM

## 2021-11-05 DIAGNOSIS — D649 Anemia, unspecified: Secondary | ICD-10-CM

## 2021-11-24 ENCOUNTER — Other Ambulatory Visit: Payer: Self-pay

## 2021-11-24 ENCOUNTER — Ambulatory Visit
Admission: RE | Admit: 2021-11-24 | Discharge: 2021-11-24 | Disposition: A | Discharge status: Home / Self Care | Payer: Medicare HMO | Source: Ambulatory Visit | Attending: Gastroenterology | Admitting: Gastroenterology

## 2021-11-24 DIAGNOSIS — K449 Diaphragmatic hernia without obstruction or gangrene: Secondary | ICD-10-CM | POA: Diagnosis not present

## 2021-11-24 DIAGNOSIS — N200 Calculus of kidney: Secondary | ICD-10-CM | POA: Diagnosis not present

## 2021-11-24 DIAGNOSIS — R197 Diarrhea, unspecified: Secondary | ICD-10-CM | POA: Diagnosis not present

## 2021-11-24 DIAGNOSIS — D649 Anemia, unspecified: Secondary | ICD-10-CM | POA: Diagnosis not present

## 2021-11-24 DIAGNOSIS — K5730 Diverticulosis of large intestine without perforation or abscess without bleeding: Secondary | ICD-10-CM | POA: Diagnosis not present

## 2021-11-24 DIAGNOSIS — I700 Atherosclerosis of aorta: Secondary | ICD-10-CM | POA: Insufficient documentation

## 2021-11-24 DIAGNOSIS — K862 Cyst of pancreas: Secondary | ICD-10-CM | POA: Diagnosis not present

## 2021-11-24 DIAGNOSIS — K8689 Other specified diseases of pancreas: Secondary | ICD-10-CM | POA: Diagnosis not present

## 2021-11-24 MED ORDER — IOHEXOL 300 MG/ML  SOLN
100.0000 mL | Freq: Once | INTRAMUSCULAR | Status: AC | PRN
  Administered 2021-11-24: 100 mL via INTRAVENOUS

## 2021-11-25 ENCOUNTER — Telehealth: Payer: Self-pay | Admitting: Internal Medicine

## 2021-11-25 ENCOUNTER — Other Ambulatory Visit: Payer: Self-pay | Admitting: *Deleted

## 2021-11-25 DIAGNOSIS — D508 Other iron deficiency anemias: Secondary | ICD-10-CM

## 2021-11-25 NOTE — Telephone Encounter (Signed)
Pt called to reschedule her appts. Call back at [phone]

## 2021-12-03 ENCOUNTER — Other Ambulatory Visit: Payer: Medicare HMO

## 2021-12-03 ENCOUNTER — Other Ambulatory Visit: Payer: Self-pay

## 2021-12-03 ENCOUNTER — Inpatient Hospital Stay: Payer: Medicare HMO | Attending: Internal Medicine

## 2021-12-03 DIAGNOSIS — C73 Malignant neoplasm of thyroid gland: Secondary | ICD-10-CM | POA: Diagnosis not present

## 2021-12-03 DIAGNOSIS — D472 Monoclonal gammopathy: Secondary | ICD-10-CM | POA: Insufficient documentation

## 2021-12-03 DIAGNOSIS — Z79899 Other long term (current) drug therapy: Secondary | ICD-10-CM | POA: Diagnosis not present

## 2021-12-03 DIAGNOSIS — D509 Iron deficiency anemia, unspecified: Secondary | ICD-10-CM | POA: Diagnosis not present

## 2021-12-03 DIAGNOSIS — D508 Other iron deficiency anemias: Secondary | ICD-10-CM

## 2021-12-03 LAB — BASIC METABOLIC PANEL
Anion gap: 9 (ref 5–15)
BUN: 22 mg/dL (ref 8–23)
CO2: 26 mmol/L (ref 22–32)
Calcium: 9.1 mg/dL (ref 8.9–10.3)
Chloride: 102 mmol/L (ref 98–111)
Creatinine, Ser: 1.29 mg/dL — ABNORMAL HIGH (ref 0.44–1.00)
GFR, Estimated: 42 mL/min — ABNORMAL LOW (ref >60)
Glucose, Bld: 106 mg/dL — ABNORMAL HIGH (ref 70–99)
Potassium: 3.9 mmol/L (ref 3.5–5.1)
Sodium: 137 mmol/L (ref 135–145)

## 2021-12-03 LAB — CBC WITH DIFFERENTIAL/PLATELET
Abs Immature Granulocytes: 0.02 10*3/uL (ref 0.00–0.07)
Basophils Absolute: 0 10*3/uL (ref 0.0–0.1)
Basophils Relative: 1 %
Eosinophils Absolute: 0.1 10*3/uL (ref 0.0–0.5)
Eosinophils Relative: 2 %
HCT: 36 % (ref 36.0–46.0)
Hemoglobin: 11.6 g/dL — ABNORMAL LOW (ref 12.0–15.0)
Immature Granulocytes: 0 %
Lymphocytes Relative: 13 %
Lymphs Abs: 0.8 10*3/uL (ref 0.7–4.0)
MCH: 30.9 pg (ref 26.0–34.0)
MCHC: 32.2 g/dL (ref 30.0–36.0)
MCV: 96 fL (ref 80.0–100.0)
Monocytes Absolute: 0.5 10*3/uL (ref 0.1–1.0)
Monocytes Relative: 8 %
Neutro Abs: 4.4 10*3/uL (ref 1.7–7.7)
Neutrophils Relative %: 76 %
Platelets: 298 10*3/uL (ref 150–400)
RBC: 3.75 MIL/uL — ABNORMAL LOW (ref 3.87–5.11)
RDW: 13.5 % (ref 11.5–15.5)
WBC: 5.8 10*3/uL (ref 4.0–10.5)
nRBC: 0 % (ref 0.0–0.2)

## 2021-12-03 LAB — IRON AND TIBC
Iron: 60 ug/dL (ref 28–170)
Saturation Ratios: 23 % (ref 10.4–31.8)
TIBC: 263 ug/dL (ref 250–450)
UIBC: 203 ug/dL

## 2021-12-03 LAB — FERRITIN: Ferritin: 370 ng/mL — ABNORMAL HIGH (ref 11–307)

## 2021-12-04 LAB — THYROID PANEL WITH TSH
Free Thyroxine Index: 2.7 (ref 1.2–4.9)
T3 Uptake Ratio: 28 % (ref 24–39)
T4, Total: 9.5 ug/dL (ref 4.5–12.0)
TSH: 0.294 u[IU]/mL — ABNORMAL LOW (ref 0.450–4.500)

## 2021-12-06 ENCOUNTER — Other Ambulatory Visit: Payer: Self-pay

## 2021-12-06 ENCOUNTER — Encounter: Payer: Self-pay | Admitting: Internal Medicine

## 2021-12-06 ENCOUNTER — Inpatient Hospital Stay: Payer: Medicare HMO

## 2021-12-06 ENCOUNTER — Inpatient Hospital Stay: Payer: Medicare HMO | Admitting: Internal Medicine

## 2021-12-06 DIAGNOSIS — C73 Malignant neoplasm of thyroid gland: Secondary | ICD-10-CM

## 2021-12-06 DIAGNOSIS — Z79899 Other long term (current) drug therapy: Secondary | ICD-10-CM | POA: Diagnosis not present

## 2021-12-06 DIAGNOSIS — D472 Monoclonal gammopathy: Secondary | ICD-10-CM | POA: Diagnosis not present

## 2021-12-06 DIAGNOSIS — D509 Iron deficiency anemia, unspecified: Secondary | ICD-10-CM | POA: Diagnosis not present

## 2021-12-06 LAB — KAPPA/LAMBDA LIGHT CHAINS
Kappa free light chain: 58 mg/L — ABNORMAL HIGH (ref 3.3–19.4)
Kappa, lambda light chain ratio: 1.91 — ABNORMAL HIGH (ref 0.26–1.65)
Lambda free light chains: 30.3 mg/L — ABNORMAL HIGH (ref 5.7–26.3)

## 2021-12-06 NOTE — Progress Notes (Signed)
[place] OFFICE PROGRESS NOTE  Patient Care Team: Adin Hector, MD as PCP - General (Internal Medicine) Cammie Sickle, MD as Medical Oncologist (Hematology and Oncology)   Cancer Staging  No matching staging information was found for the patient.   Oncology History Overview Note  1999- right thyroid lobectomy by Dr. Orene Desanctis with unusual poorly differentiated cancer, no additional treatment  2015- recurrent mass in thyroid bed. PET-CT showed FDG avid lesion in thyroid bed + nasopharyx.  2015- Dr. 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 Margins not noted [facility], [place], #[id number]).   01/05/2014- Nasopharyngeal biopsy negative for malignancy  04/25/14- completed postoperative XRT completing 65.25 CGy in 29 fractions  05/2014 + 07/2014- PET avidity at the left nasopharynx and left oropharyngeal soft tissue as well as a hypermetabolic 1.5 cm mass in the right thyroid bed. Dr Johney Frame excised the paratracheal mass on 09/02/2014. Inspect of the nasopharynx at the time of surgery revealed no obvious abnormality and no biopsy was completed.   09/2014- NP avid area biopsied and negative for carcinoma. SHe underwent a thyroid FNA and that was negative for cancer.  # Chronic Anemia- IDA [ EGD- hiatal hernia; Jan 2017- Dr.Rein] AUG 2017- IV Venofer  # AUG 2017- Thyroglobulin 40/Sep 2017 US neck- NED  # Hoarseness of voice- sec to RT [Dr.Bennett; Dec 2017]  # AUG 2017- IgG kappa 0.4gm//N-kappa-Lamda light chains     Malignant neoplasm of thyroid gland ([place])  09/01/2014 Initial Diagnosis   Malignant neoplasm of thyroid gland ([place])      INTERVAL HISTORY: Alone.  Ambulating independently.  Sara Fields 81 y.o.  female pleasant patient above history of thyroid cancer; and  MGUS; diagnosis of iron  deficiency anemia unclear etiology is here for follow-up.   Otherwise patient denies any new shortness of breath or cough he denies any worsening lumps or bumps in the neck.  No blood in stools no black or stools.   Review of Systems  Constitutional:  Positive for malaise/fatigue. Negative for chills, diaphoresis, fever and weight loss.  HENT:  Negative for nosebleeds and sore throat.   Eyes:  Negative for double vision.  Respiratory:  Negative for cough, hemoptysis, sputum production, shortness of breath and wheezing.   Cardiovascular:  Negative for chest pain, palpitations, orthopnea and leg swelling.  Gastrointestinal:  Negative for abdominal pain, blood in stool, constipation, diarrhea, heartburn, melena, nausea and vomiting.  Genitourinary:  Negative for dysuria, frequency and urgency.  Musculoskeletal:  Positive for neck pain. Negative for back pain and joint pain.  Skin: Negative.  Negative for itching and rash.  Neurological:  Negative for dizziness, tingling, focal weakness, weakness and headaches.  Endo/Heme/Allergies:  Does not bruise/bleed easily.  Psychiatric/Behavioral:  Negative for depression. The patient is not nervous/anxious and does not have insomnia.     PAST MEDICAL HISTORY :  Past Medical History:  Diagnosis Date   Anemia    Asthma    COPD (chronic obstructive pulmonary disease) (HCC)    Dyspnea    GERD (gastroesophageal reflux disease)    Hyperlipidemia    Hypertension    Thyroid cancer ([place]) 1999 and 2015   Partial thyroidectomy with rad tx's.     PAST SURGICAL HISTORY :   Past Surgical History:  Procedure Laterality Date   ABDOMINAL HYSTERECTOMY  1973   partial  COLONOSCOPY WITH PROPOFOL N/A 09/08/2021   Procedure: COLONOSCOPY WITH PROPOFOL;  Surgeon: Robert Bellow, MD;  Location: [facility] ENDOSCOPY;  Service: Endoscopy;  Laterality: N/A;   ESOPHAGOGASTRODUODENOSCOPY N/A 02/05/2017   Procedure: ESOPHAGOGASTRODUODENOSCOPY (EGD);  Surgeon:  Wilford Corner, MD;  Location: [facility] ENDOSCOPY;  Service: Endoscopy;  Laterality: N/A;   ESOPHAGOGASTRODUODENOSCOPY (EGD) WITH PROPOFOL N/A 11/16/2015   Procedure: ESOPHAGOGASTRODUODENOSCOPY (EGD) WITH PROPOFOL;  Surgeon: Josefine Class, MD;  Location: [facility] ENDOSCOPY;  Service: Endoscopy;  Laterality: N/A;   ESOPHAGOGASTRODUODENOSCOPY (EGD) WITH PROPOFOL N/A 09/08/2021   Procedure: ESOPHAGOGASTRODUODENOSCOPY (EGD) WITH PROPOFOL;  Surgeon: Robert Bellow, MD;  Location: [facility] ENDOSCOPY;  Service: Endoscopy;  Laterality: N/A;   THYROID SURGERY  1999 and 2015   Partial Thyroidectomy    FAMILY HISTORY :   Family History  Problem Relation Age of Onset   Lung cancer Father    Hypertension Father    Bone cancer Sister    Hypertension Sister    Lung cancer Brother    Hypertension Brother    Kidney cancer Sister    Hypertension Sister    Lung cancer Sister    Hypertension Sister    Breast cancer Neg Hx     SOCIAL HISTORY:   Social History   Tobacco Use   Smoking status: Never   Smokeless tobacco: Never  Vaping Use   Vaping Use: Never used  Substance Use Topics   Alcohol use: No   Drug use: No    ALLERGIES:  has No Known Allergies.  MEDICATIONS:  Current Outpatient Medications  Medication Sig Dispense Refill   albuterol (PROVENTIL HFA;VENTOLIN HFA) 108 (90 Base) MCG/ACT inhaler Inhale into the lungs every 6 (six) hours as needed for wheezing or shortness of breath.     allopurinol (ZYLOPRIM) 100 MG tablet Take 100 mg by mouth daily.     amLODipine (NORVASC) 5 MG tablet Take 1 tablet by mouth.     azelastine (ASTELIN) 0.1 % nasal spray Place 1 spray into both nostrils daily as needed for rhinitis or allergies.     bumetanide (BUMEX) 1 MG tablet Take 1 mg by mouth daily.     calcium carbonate (OS-CAL) 600 MG TABS tablet Take 600 mg by mouth 2 (two) times daily with a meal.     ipratropium-albuterol (DUONEB) 0.5-2.5 (3) MG/3ML SOLN Inhale 3 mLs  into the lungs in the morning, at noon, in the evening, and at bedtime.     levothyroxine (SYNTHROID) 88 MCG tablet TAKE 1 TABLET ONCE DAILY TAKE ON AN EMPTY STOMACH WITH A GLASS OF WATER AT LEAST 30-60 MINUTES BEFORE BREAKFAST     LORazepam (ATIVAN) 0.5 MG tablet Take 0.5 mg by mouth every 8 (eight) hours as needed for anxiety.      montelukast (SINGULAIR) 10 MG tablet Take 10 mg by mouth at bedtime.     pantoprazole (PROTONIX) 40 MG tablet Take 1 tablet (40 mg total) by mouth 2 (two) times daily before a meal. 60 tablet 1   potassium chloride SA (KLOR-CON) 20 MEQ tablet Take 20 mEq by mouth daily.      propranolol (INDERAL) 20 MG tablet Take 20 mg by mouth 3 (three) times daily.     traMADol (ULTRAM) 50 MG tablet Take 50 mg by mouth every 6 (six) hours as needed.     meclizine (ANTIVERT) 12.5 MG tablet Take 12.5 mg by mouth 2 (two) times daily as needed for dizziness.  (Patient not taking: Reported on 06/02/2021)     rosuvastatin (  CRESTOR) 10 MG tablet Take 10 mg by mouth daily.      No current facility-administered medications for this visit.   Facility-Administered Medications Ordered in Other Visits  Medication Dose Route Frequency Provider Last Rate Last Admin   iron sucrose (VENOFER) 200 mg IVPB  200 mg Intravenous Once Cammie Sickle, MD        PHYSICAL EXAMINATION: ECOG PERFORMANCE STATUS:   BP 129/69 (BP Location: Right Arm, Patient Position: Sitting, Cuff Size: Normal)    Pulse 78    Temp 98.3 F (36.8 C) (Tympanic)    Ht '5\' 7"'  (1.702 m)    Wt 191 lb (86.6 kg)    SpO2 100%    BMI 29.91 kg/m   Filed Weights   12/06/21 1309  Weight: 191 lb (86.6 kg)    Physical Exam Constitutional:      Comments: Alone.  Ambulating with a cane.  HENT:     Head: Normocephalic and atraumatic.     Mouth/Throat:     Pharynx: No oropharyngeal exudate.  Eyes:     Pupils: Pupils are equal, round, and reactive to light.  Neck:     Comments: Chronic scarring changes noted in  the neck bilaterally.  No swelling noted. Cardiovascular:     Rate and Rhythm: Normal rate and regular rhythm.  Pulmonary:     Effort: Pulmonary effort is normal. No respiratory distress.     Breath sounds: Normal breath sounds. No wheezing.  Abdominal:     General: Bowel sounds are normal. There is no distension.     Palpations: Abdomen is soft. There is no mass.     Tenderness: There is no abdominal tenderness. There is no guarding or rebound.  Musculoskeletal:        General: No tenderness. Normal range of motion.     Cervical back: Normal range of motion and neck supple.  Skin:    General: Skin is warm.  Neurological:     Mental Status: She is alert and oriented to person, place, and time.  Psychiatric:        Mood and Affect: Affect normal.     LABORATORY DATA:  I have reviewed the data as listed    Component Value Date/Time   NA 137 12/03/2021 1004   K 3.9 12/03/2021 1004   CL 102 12/03/2021 1004   CO2 26 12/03/2021 1004   GLUCOSE 106 (H) 12/03/2021 1004   BUN 22 12/03/2021 1004   CREATININE 1.29 (H) 12/03/2021 1004   CALCIUM 9.1 12/03/2021 1004   PROT 7.8 05/03/2019 1113   ALBUMIN 4.2 05/03/2019 1113   AST 23 05/03/2019 1113   ALT 13 05/03/2019 1113   ALKPHOS 88 05/03/2019 1113   BILITOT 0.7 05/03/2019 1113   GFRNONAA 42 (L) 12/03/2021 1004   GFRAA 58 (L) 05/26/2020 1358    No results found for: SPEP, UPEP  Lab Results  Component Value Date   WBC 5.8 12/03/2021   NEUTROABS 4.4 12/03/2021   HGB 11.6 (L) 12/03/2021   HCT 36.0 12/03/2021   MCV 96.0 12/03/2021   PLT 298 12/03/2021      Chemistry      Component Value Date/Time   NA 137 12/03/2021 1004   K 3.9 12/03/2021 1004   CL 102 12/03/2021 1004   CO2 26 12/03/2021 1004   BUN 22 12/03/2021 1004   CREATININE 1.29 (H) 12/03/2021 1004      Component Value Date/Time   CALCIUM 9.1 12/03/2021 1004   ALKPHOS  88 05/03/2019 1113   AST 23 05/03/2019 1113   ALT 13 05/03/2019 1113   BILITOT 0.7  05/03/2019 1113       RADIOGRAPHIC STUDIES: I have personally reviewed the radiological images as listed and agreed with the findings in the report. No results found.   ASSESSMENT & PLAN:  Malignant neoplasm of thyroid gland ([place]) # Thyroid ca with local reurence s/p surgery [[facility]; EBRT [2015; no RAI].  No clinical no evidence of recurrence.  Thyroglobulin-normal/ STABLE. FEB 2023 TSH - 0.29 continue current dose of Synthroid.   #Iron deficient anemia-unclear etiology/CKD III hemoglobin- STABLE 11.6 STABLE; sat-23%;    Continue p.o. iron.  Hold off IV iron infusion.   # YTKP-TWS 5681- IgGKappa-0.6 [prior 0.3-0.6] g/dL; kappa lambda light chain ratio normal.  STABLE.  # CKD stage III- STABLE; GFR-42.   # DISPOSITION: # NO IV venofer infusion. # follow up 6 months- MD; Reva Bores- 1 week prior-  cbc/bmp-possible venofer; / Thyroid profile; iron studies/ferritin; MM panel; K/L light chains-Dr.B  Cc; Dr.Klein.    Orders Placed This Encounter  Procedures   TgAb+Thyroglobulin IMA or RIA    Standing Status:   Future    Standing Expiration Date:   12/06/2022   CBC with Differential/Platelet    Standing Status:   Future    Standing Expiration Date:   2/75/1700   Basic metabolic panel    Standing Status:   Future    Standing Expiration Date:   12/06/2022   Thyroid Panel With TSH    Standing Status:   Future    Standing Expiration Date:   12/06/2022   Iron and TIBC    Standing Status:   Future    Standing Expiration Date:   12/06/2022   Ferritin    Standing Status:   Future    Standing Expiration Date:   12/06/2022   Multiple Myeloma Panel (SPEP&IFE w/QIG)    Standing Status:   Future    Standing Expiration Date:   12/06/2022   Kappa/lambda light chains    Standing Status:   Future    Standing Expiration Date:   12/06/2022   All questions were answered. The patient knows to call the clinic with any problems, questions or concerns.     Cammie Sickle, MD 12/20/2021 2:25  PM

## 2021-12-06 NOTE — Assessment & Plan Note (Addendum)
#   Thyroid ca with local reurence s/p surgery [[facility]; EBRT [2015; no RAI].  No clinical no evidence of recurrence.  Thyroglobulin-normal/ STABLE. FEB 2023 TSH - 0.29 continue current dose of Synthroid.   #Iron deficient anemia-unclear etiology/CKD III hemoglobin- STABLE 11.6 STABLE; sat-23%;    Continue p.o. iron.  Hold off IV iron infusion.   # PZPS-UGA 4847- IgGKappa-0.6 [prior 0.3-0.6] g/dL; kappa lambda light chain ratio normal.  STABLE.  # CKD stage III- STABLE; GFR-42.   # DISPOSITION: # NO IV venofer infusion. # follow up 6 months- MD; Sara Fields- 1 week prior-  cbc/bmp-possible venofer; / Thyroid profile; iron studies/ferritin; MM panel; K/L light chains-Dr.B  Cc; Dr.Klein.

## 2021-12-07 LAB — MULTIPLE MYELOMA PANEL, SERUM
Albumin SerPl Elph-Mcnc: 3.8 g/dL (ref 2.9–4.4)
Albumin/Glob SerPl: 1.2 (ref 0.7–1.7)
Alpha 1: 0.3 g/dL (ref 0.0–0.4)
Alpha2 Glob SerPl Elph-Mcnc: 0.9 g/dL (ref 0.4–1.0)
B-Globulin SerPl Elph-Mcnc: 0.9 g/dL (ref 0.7–1.3)
Gamma Glob SerPl Elph-Mcnc: 1.4 g/dL (ref 0.4–1.8)
Globulin, Total: 3.4 g/dL (ref 2.2–3.9)
IgA: 221 mg/dL (ref 64–422)
IgG (Immunoglobin G), Serum: 1285 mg/dL (ref 586–1602)
IgM (Immunoglobulin M), Srm: 165 mg/dL (ref 26–217)
M Protein SerPl Elph-Mcnc: 0.6 g/dL — ABNORMAL HIGH
Total Protein ELP: 7.2 g/dL (ref 6.0–8.5)

## 2021-12-14 DIAGNOSIS — K219 Gastro-esophageal reflux disease without esophagitis: Secondary | ICD-10-CM | POA: Diagnosis not present

## 2021-12-14 DIAGNOSIS — K449 Diaphragmatic hernia without obstruction or gangrene: Secondary | ICD-10-CM | POA: Diagnosis not present

## 2021-12-14 DIAGNOSIS — K862 Cyst of pancreas: Secondary | ICD-10-CM | POA: Diagnosis not present

## 2021-12-14 DIAGNOSIS — R1314 Dysphagia, pharyngoesophageal phase: Secondary | ICD-10-CM | POA: Diagnosis not present

## 2021-12-20 ENCOUNTER — Encounter: Payer: Self-pay | Admitting: Internal Medicine

## 2021-12-21 DIAGNOSIS — C73 Malignant neoplasm of thyroid gland: Secondary | ICD-10-CM | POA: Diagnosis not present

## 2021-12-21 DIAGNOSIS — E538 Deficiency of other specified B group vitamins: Secondary | ICD-10-CM | POA: Diagnosis not present

## 2021-12-21 DIAGNOSIS — M109 Gout, unspecified: Secondary | ICD-10-CM | POA: Diagnosis not present

## 2021-12-21 DIAGNOSIS — N1831 Chronic kidney disease, stage 3a: Secondary | ICD-10-CM | POA: Diagnosis not present

## 2021-12-21 DIAGNOSIS — Z79891 Long term (current) use of opiate analgesic: Secondary | ICD-10-CM | POA: Diagnosis not present

## 2021-12-21 DIAGNOSIS — I10 Essential (primary) hypertension: Secondary | ICD-10-CM | POA: Diagnosis not present

## 2021-12-21 DIAGNOSIS — D649 Anemia, unspecified: Secondary | ICD-10-CM | POA: Diagnosis not present

## 2021-12-23 DIAGNOSIS — I129 Hypertensive chronic kidney disease with stage 1 through stage 4 chronic kidney disease, or unspecified chronic kidney disease: Secondary | ICD-10-CM | POA: Diagnosis not present

## 2021-12-23 DIAGNOSIS — C73 Malignant neoplasm of thyroid gland: Secondary | ICD-10-CM | POA: Diagnosis not present

## 2021-12-23 DIAGNOSIS — J8410 Pulmonary fibrosis, unspecified: Secondary | ICD-10-CM | POA: Diagnosis not present

## 2021-12-23 DIAGNOSIS — M349 Systemic sclerosis, unspecified: Secondary | ICD-10-CM | POA: Diagnosis not present

## 2021-12-23 DIAGNOSIS — Z0000 Encounter for general adult medical examination without abnormal findings: Secondary | ICD-10-CM | POA: Diagnosis not present

## 2021-12-23 DIAGNOSIS — D472 Monoclonal gammopathy: Secondary | ICD-10-CM | POA: Diagnosis not present

## 2021-12-23 DIAGNOSIS — Z1389 Encounter for screening for other disorder: Secondary | ICD-10-CM | POA: Diagnosis not present

## 2021-12-23 DIAGNOSIS — M109 Gout, unspecified: Secondary | ICD-10-CM | POA: Diagnosis not present

## 2021-12-23 DIAGNOSIS — D631 Anemia in chronic kidney disease: Secondary | ICD-10-CM | POA: Diagnosis not present

## 2021-12-23 DIAGNOSIS — N1831 Chronic kidney disease, stage 3a: Secondary | ICD-10-CM | POA: Diagnosis not present

## 2021-12-27 DIAGNOSIS — H903 Sensorineural hearing loss, bilateral: Secondary | ICD-10-CM | POA: Diagnosis not present

## 2021-12-27 DIAGNOSIS — H6121 Impacted cerumen, right ear: Secondary | ICD-10-CM | POA: Diagnosis not present

## 2022-01-11 DIAGNOSIS — M349 Systemic sclerosis, unspecified: Secondary | ICD-10-CM | POA: Diagnosis not present

## 2022-01-11 DIAGNOSIS — I10 Essential (primary) hypertension: Secondary | ICD-10-CM | POA: Diagnosis not present

## 2022-01-11 DIAGNOSIS — E538 Deficiency of other specified B group vitamins: Secondary | ICD-10-CM | POA: Diagnosis not present

## 2022-01-11 DIAGNOSIS — N1831 Chronic kidney disease, stage 3a: Secondary | ICD-10-CM | POA: Diagnosis not present

## 2022-01-20 DIAGNOSIS — C73 Malignant neoplasm of thyroid gland: Secondary | ICD-10-CM | POA: Diagnosis not present

## 2022-01-20 DIAGNOSIS — E538 Deficiency of other specified B group vitamins: Secondary | ICD-10-CM | POA: Diagnosis not present

## 2022-01-20 DIAGNOSIS — I129 Hypertensive chronic kidney disease with stage 1 through stage 4 chronic kidney disease, or unspecified chronic kidney disease: Secondary | ICD-10-CM | POA: Diagnosis not present

## 2022-01-20 DIAGNOSIS — M109 Gout, unspecified: Secondary | ICD-10-CM | POA: Diagnosis not present

## 2022-01-20 DIAGNOSIS — N1831 Chronic kidney disease, stage 3a: Secondary | ICD-10-CM | POA: Diagnosis not present

## 2022-01-20 DIAGNOSIS — J8410 Pulmonary fibrosis, unspecified: Secondary | ICD-10-CM | POA: Diagnosis not present

## 2022-01-20 DIAGNOSIS — D631 Anemia in chronic kidney disease: Secondary | ICD-10-CM | POA: Diagnosis not present

## 2022-01-20 DIAGNOSIS — N2581 Secondary hyperparathyroidism of renal origin: Secondary | ICD-10-CM | POA: Diagnosis not present

## 2022-01-20 DIAGNOSIS — D472 Monoclonal gammopathy: Secondary | ICD-10-CM | POA: Diagnosis not present

## 2022-02-17 DIAGNOSIS — R0609 Other forms of dyspnea: Secondary | ICD-10-CM | POA: Diagnosis not present

## 2022-02-17 DIAGNOSIS — J8410 Pulmonary fibrosis, unspecified: Secondary | ICD-10-CM | POA: Diagnosis not present

## 2022-02-17 DIAGNOSIS — D649 Anemia, unspecified: Secondary | ICD-10-CM | POA: Diagnosis not present

## 2022-02-17 DIAGNOSIS — J449 Chronic obstructive pulmonary disease, unspecified: Secondary | ICD-10-CM | POA: Diagnosis not present

## 2022-02-21 IMAGING — CT CT ENTEROGRAPHY (ABD-PELV W/ CM)
2 of 6 series · 15 of 46 positions shown, 17 images · IV contrast (APPLIED)
Comparison: None.

CLINICAL DATA: Diarrhea for 6 months. Anemia.

EXAM:
CT ABDOMEN AND PELVIS WITH CONTRAST (ENTEROGRAPHY)
TECHNIQUE: Multidetector CT of the abdomen and pelvis during bolus
administration of intravenous contrast. Negative oral contrast was
given.

[Series 3: entero thins · axial · 0.71mm/px · z∈[-818,-438]mm · 12 of 214 slices shown, 14 images]
[im 12/214  soft-tissue]
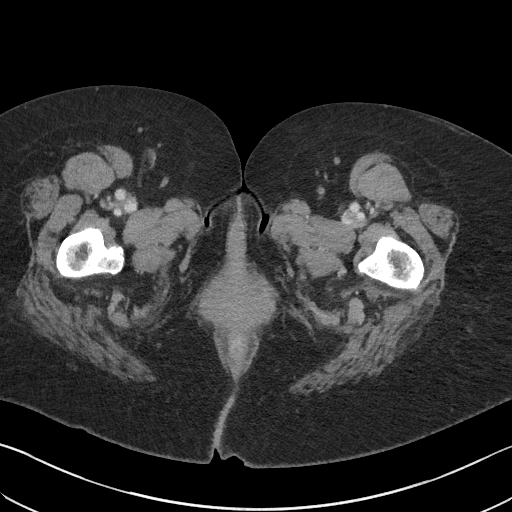
[im 12/214  bone]
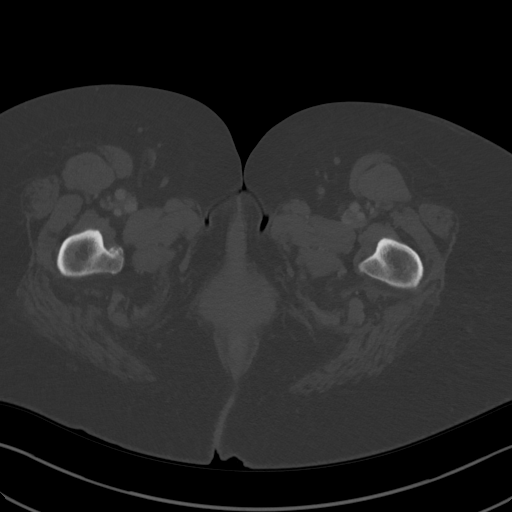
[im 34/214  soft-tissue]
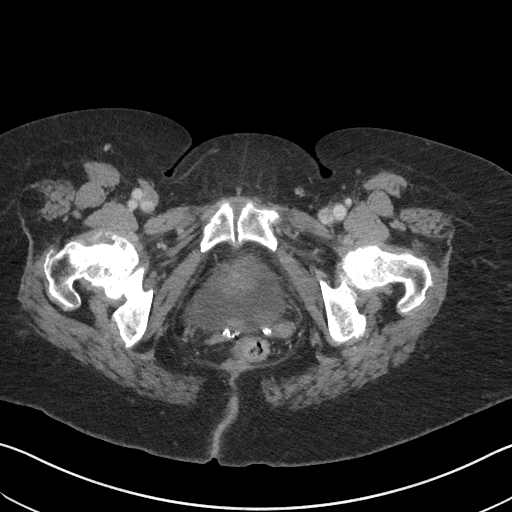
[im 45/214  soft-tissue]
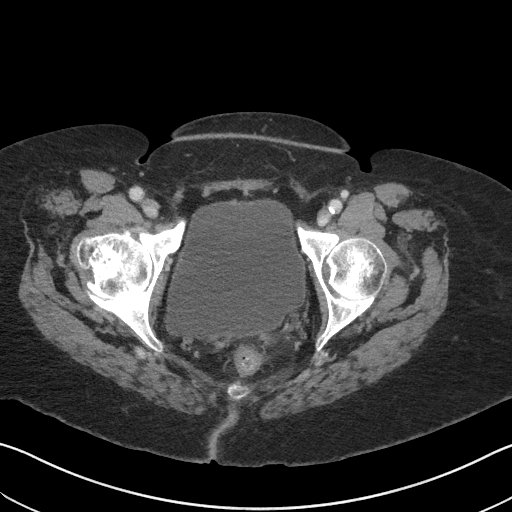
[im 68/214  soft-tissue]
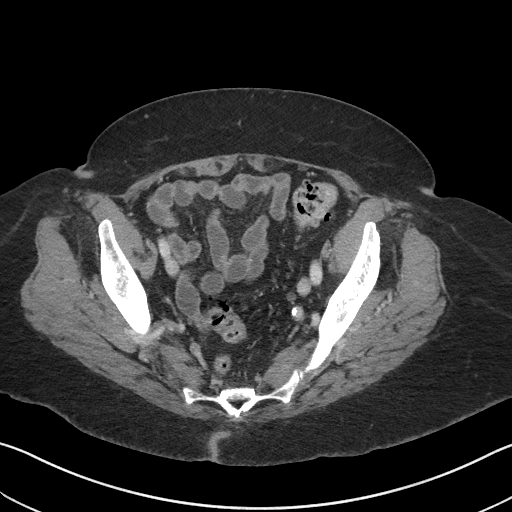
[im 79/214  soft-tissue]
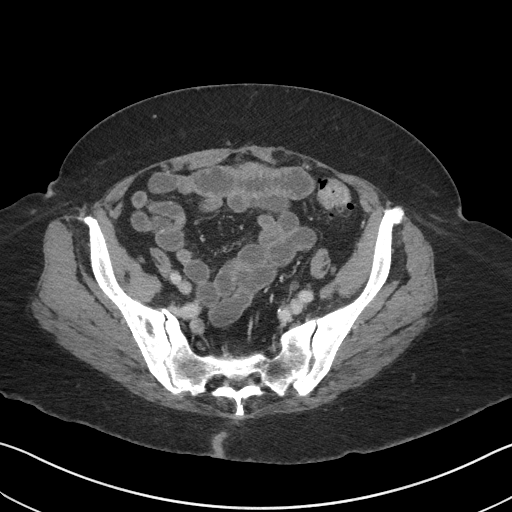
[im 101/214  soft-tissue]
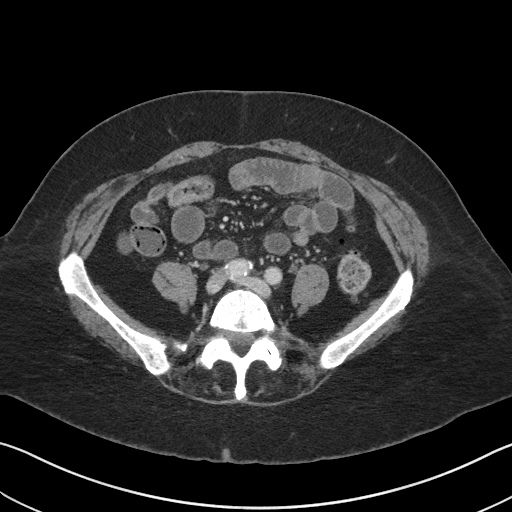
[im 113/214  soft-tissue]
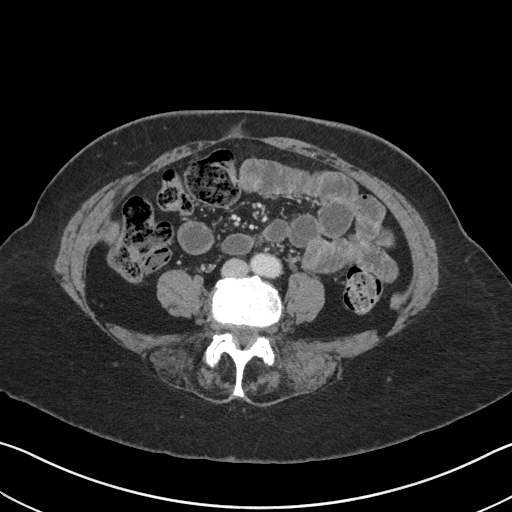
[im 135/214  soft-tissue]
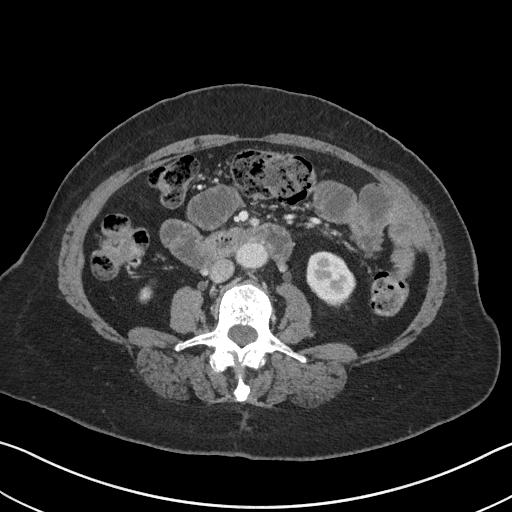
[im 146/214  soft-tissue]
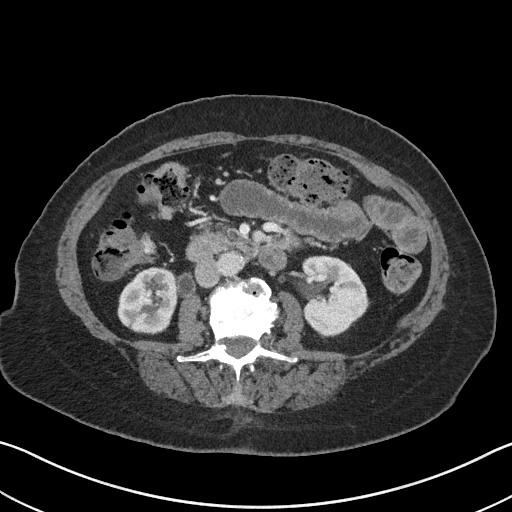
[im 146/214  bone]
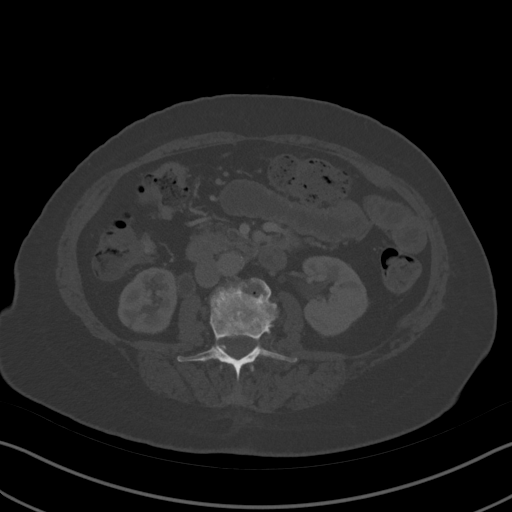
[im 169/214  soft-tissue]
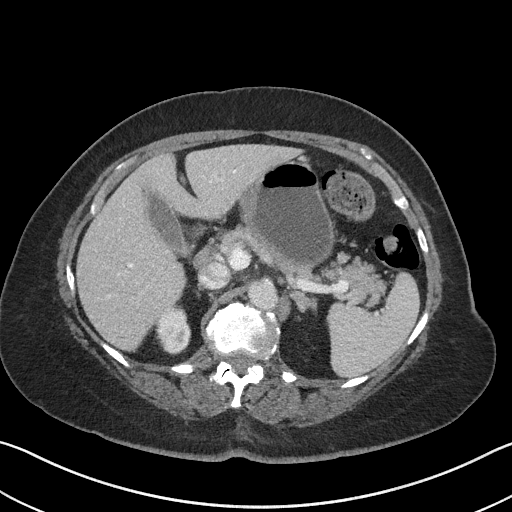
[im 180/214  soft-tissue]
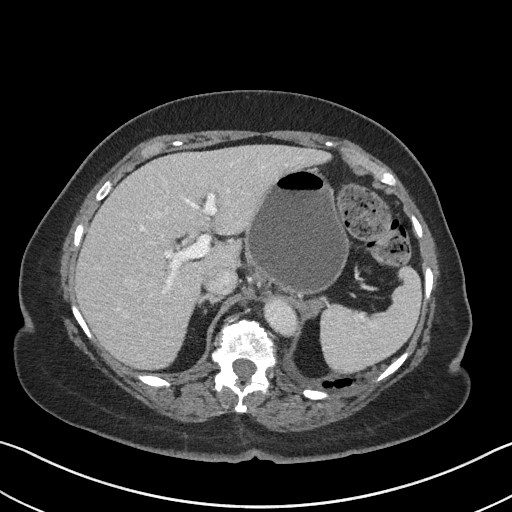
[im 202/214  soft-tissue]
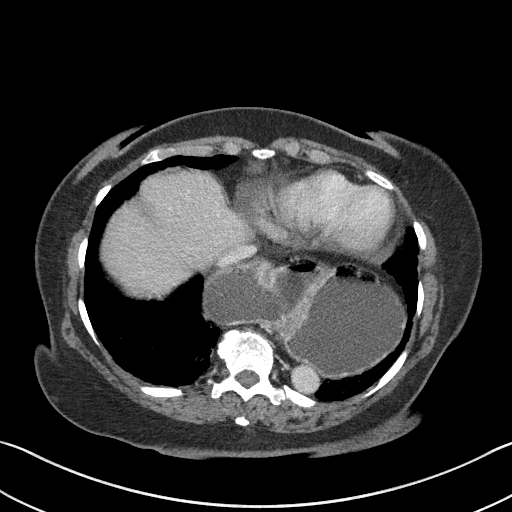

[Series 6: coronal · coronal · 0.71mm/px · 3 of 94 slices shown]
[im 32/94  soft-tissue]
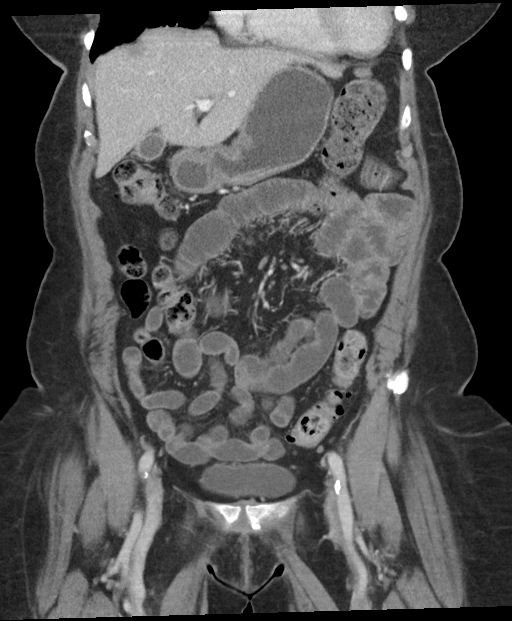
[im 42/94  soft-tissue]
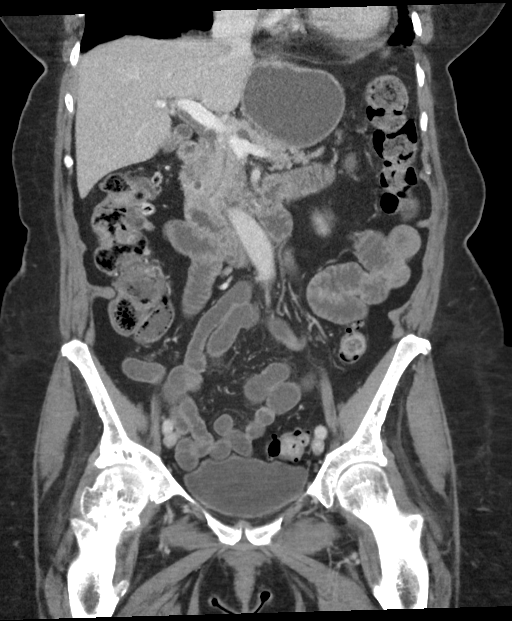
[im 52/94  soft-tissue]
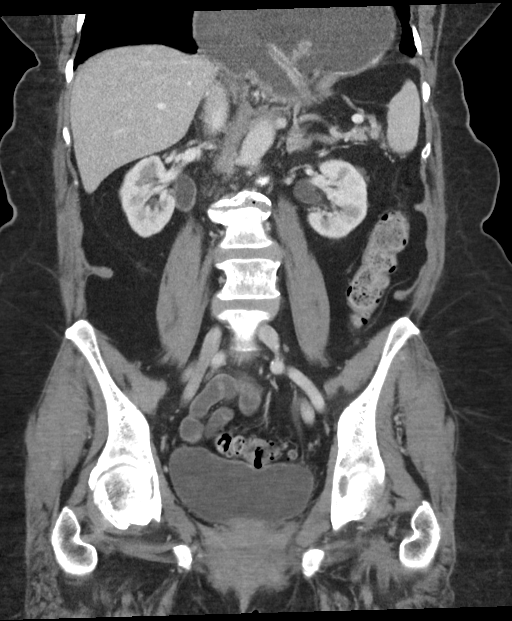

[15 of 46 positions shown; findings below may reference images not displayed]

RADIATION DOSE REDUCTION: This exam was performed according to the
departmental dose-optimization program which includes automated
exposure control, adjustment of the mA and/or kV according to
patient size and/or use of iterative reconstruction technique.

CONTRAST:  100mL OMNIPAQUE IOHEXOL 300 MG/ML  SOLN
FINDINGS: Lower Chest: No acute findings.

Hepatobiliary: No hepatic masses identified. Gallbladder is
unremarkable. No evidence of biliary ductal dilatation.

Pancreas: No evidence of solid pancreatic mass or pancreatitis. A
1.7 x 0.8 cm simple appearing cystic lesion is seen in the
pancreatic body on image 45/3. No evidence of pancreatic ductal
dilatation.

Spleen: Within normal limits in size and appearance.

Adrenals/Urinary Tract: No masses identified. A 9 mm calculus is
seen in the right renal pelvis. No evidence of ureteral calculi or
hydronephrosis.

Stomach/Bowel: A large hiatal hernia is seen. No evidence of bowel
wall thickening, abnormal contrast enhancement, or mesenteric
inflammatory changes. No evidence of stricture or dilatation. No
signs of penetrating disease, fistulae, or abnormal fluid
collections. The terminal ileum is normal in appearance.
Diverticulosis is seen involving the ascending and sigmoid portions
of the colon, however there is no evidence of diverticulitis.

Vascular/Lymphatic: No pathologically enlarged lymph nodes. No acute
vascular findings. Aortic atherosclerotic calcification noted.

Reproductive: Prior hysterectomy noted. Adnexal regions are
unremarkable in appearance.

Other:  None.

Musculoskeletal: No suspicious bone lesions identified. Severe
degenerative disc disease is seen at L1-2 and L2-3. Old fracture
deformity of right hip also noted.
IMPRESSION: No radiographic evidence of inflammatory bowel disease or other
acute findings.

Large hiatal hernia.

Colonic diverticulosis, without radiographic evidence of
diverticulitis.

9 mm calculus in right renal pelvis. No evidence of ureteral calculi
or hydronephrosis.

1.7 cm simple appearing cystic lesion in pancreatic body.
Differential diagnosis includes an indolent cystic pancreatic
neoplasm and pseudocyst. Recommend continued imaging follow-up in 1
year, preferably with abdomen MRI without and with contrast.

Aortic Atherosclerosis ([id number]-IQW.W).

## 2022-05-24 DIAGNOSIS — E538 Deficiency of other specified B group vitamins: Secondary | ICD-10-CM | POA: Diagnosis not present

## 2022-05-24 DIAGNOSIS — E892 Postprocedural hypoparathyroidism: Secondary | ICD-10-CM | POA: Diagnosis not present

## 2022-05-24 DIAGNOSIS — I129 Hypertensive chronic kidney disease with stage 1 through stage 4 chronic kidney disease, or unspecified chronic kidney disease: Secondary | ICD-10-CM | POA: Diagnosis not present

## 2022-05-24 DIAGNOSIS — C73 Malignant neoplasm of thyroid gland: Secondary | ICD-10-CM | POA: Diagnosis not present

## 2022-05-24 DIAGNOSIS — J8410 Pulmonary fibrosis, unspecified: Secondary | ICD-10-CM | POA: Diagnosis not present

## 2022-05-24 DIAGNOSIS — R5383 Other fatigue: Secondary | ICD-10-CM | POA: Diagnosis not present

## 2022-05-24 DIAGNOSIS — N183 Chronic kidney disease, stage 3 unspecified: Secondary | ICD-10-CM | POA: Diagnosis not present

## 2022-05-24 DIAGNOSIS — D631 Anemia in chronic kidney disease: Secondary | ICD-10-CM | POA: Diagnosis not present

## 2022-05-24 DIAGNOSIS — D472 Monoclonal gammopathy: Secondary | ICD-10-CM | POA: Diagnosis not present

## 2022-05-30 ENCOUNTER — Inpatient Hospital Stay: Payer: Medicare HMO | Attending: Internal Medicine

## 2022-05-30 DIAGNOSIS — N183 Chronic kidney disease, stage 3 unspecified: Secondary | ICD-10-CM | POA: Diagnosis not present

## 2022-05-30 DIAGNOSIS — D509 Iron deficiency anemia, unspecified: Secondary | ICD-10-CM | POA: Insufficient documentation

## 2022-05-30 DIAGNOSIS — C73 Malignant neoplasm of thyroid gland: Secondary | ICD-10-CM

## 2022-05-30 DIAGNOSIS — Z85850 Personal history of malignant neoplasm of thyroid: Secondary | ICD-10-CM | POA: Diagnosis not present

## 2022-05-30 DIAGNOSIS — D472 Monoclonal gammopathy: Secondary | ICD-10-CM | POA: Diagnosis not present

## 2022-05-30 DIAGNOSIS — Z79899 Other long term (current) drug therapy: Secondary | ICD-10-CM | POA: Diagnosis not present

## 2022-05-30 LAB — CBC WITH DIFFERENTIAL/PLATELET
Abs Immature Granulocytes: 0.01 10*3/uL (ref 0.00–0.07)
Basophils Absolute: 0 10*3/uL (ref 0.0–0.1)
Basophils Relative: 1 %
Eosinophils Absolute: 0.2 10*3/uL (ref 0.0–0.5)
Eosinophils Relative: 2 %
HCT: 35.8 % — ABNORMAL LOW (ref 36.0–46.0)
Hemoglobin: 11.7 g/dL — ABNORMAL LOW (ref 12.0–15.0)
Immature Granulocytes: 0 %
Lymphocytes Relative: 13 %
Lymphs Abs: 0.8 10*3/uL (ref 0.7–4.0)
MCH: 31.1 pg (ref 26.0–34.0)
MCHC: 32.7 g/dL (ref 30.0–36.0)
MCV: 95.2 fL (ref 80.0–100.0)
Monocytes Absolute: 0.5 10*3/uL (ref 0.1–1.0)
Monocytes Relative: 8 %
Neutro Abs: 4.7 10*3/uL (ref 1.7–7.7)
Neutrophils Relative %: 76 %
Platelets: 290 10*3/uL (ref 150–400)
RBC: 3.76 MIL/uL — ABNORMAL LOW (ref 3.87–5.11)
RDW: 14.2 % (ref 11.5–15.5)
WBC: 6.2 10*3/uL (ref 4.0–10.5)
nRBC: 0 % (ref 0.0–0.2)

## 2022-05-30 LAB — IRON AND TIBC
Iron: 56 ug/dL (ref 28–170)
Saturation Ratios: 22 % (ref 10.4–31.8)
TIBC: 252 ug/dL (ref 250–450)
UIBC: 196 ug/dL

## 2022-05-30 LAB — BASIC METABOLIC PANEL
Anion gap: 9 (ref 5–15)
BUN: 15 mg/dL (ref 8–23)
CO2: 25 mmol/L (ref 22–32)
Calcium: 9 mg/dL (ref 8.9–10.3)
Chloride: 105 mmol/L (ref 98–111)
Creatinine, Ser: 1.29 mg/dL — ABNORMAL HIGH (ref 0.44–1.00)
GFR, Estimated: 42 mL/min — ABNORMAL LOW (ref >60)
Glucose, Bld: 114 mg/dL — ABNORMAL HIGH (ref 70–99)
Potassium: 3.6 mmol/L (ref 3.5–5.1)
Sodium: 139 mmol/L (ref 135–145)

## 2022-05-30 LAB — FERRITIN: Ferritin: 388 ng/mL — ABNORMAL HIGH (ref 11–307)

## 2022-05-31 LAB — KAPPA/LAMBDA LIGHT CHAINS
Kappa free light chain: 46.1 mg/L — ABNORMAL HIGH (ref 3.3–19.4)
Kappa, lambda light chain ratio: 1.71 — ABNORMAL HIGH (ref 0.26–1.65)
Lambda free light chains: 27 mg/L — ABNORMAL HIGH (ref 5.7–26.3)

## 2022-06-01 LAB — THYROID PANEL WITH TSH
Free Thyroxine Index: 2.6 (ref 1.2–4.9)
T3 Uptake Ratio: 30 % (ref 24–39)
T4, Total: 8.5 ug/dL (ref 4.5–12.0)
TSH: 0.748 u[IU]/mL (ref 0.450–4.500)

## 2022-06-02 LAB — MULTIPLE MYELOMA PANEL, SERUM
Albumin SerPl Elph-Mcnc: 3.6 g/dL (ref 2.9–4.4)
Albumin/Glob SerPl: 1.1 (ref 0.7–1.7)
Alpha 1: 0.3 g/dL (ref 0.0–0.4)
Alpha2 Glob SerPl Elph-Mcnc: 0.9 g/dL (ref 0.4–1.0)
B-Globulin SerPl Elph-Mcnc: 0.9 g/dL (ref 0.7–1.3)
Gamma Glob SerPl Elph-Mcnc: 1.4 g/dL (ref 0.4–1.8)
Globulin, Total: 3.4 g/dL (ref 2.2–3.9)
IgA: 222 mg/dL (ref 64–422)
IgG (Immunoglobin G), Serum: 1280 mg/dL (ref 586–1602)
IgM (Immunoglobulin M), Srm: 150 mg/dL (ref 26–217)
M Protein SerPl Elph-Mcnc: 0.5 g/dL — ABNORMAL HIGH
Total Protein ELP: 7 g/dL (ref 6.0–8.5)

## 2022-06-02 LAB — TGAB+THYROGLOBULIN IMA OR RIA: Thyroglobulin Antibody: <1 IU/mL (ref 0.0–0.9)

## 2022-06-02 LAB — THYROGLOBULIN BY IMA: Thyroglobulin by IMA: 4.1 ng/mL (ref 1.5–38.5)

## 2022-06-03 MED FILL — Iron Sucrose Inj 20 MG/ML (Fe Equiv): INTRAVENOUS | Qty: 10 | Status: AC

## 2022-06-06 ENCOUNTER — Inpatient Hospital Stay: Payer: Medicare HMO

## 2022-06-06 ENCOUNTER — Encounter: Payer: Self-pay | Admitting: Internal Medicine

## 2022-06-06 ENCOUNTER — Inpatient Hospital Stay: Payer: Medicare HMO | Admitting: Internal Medicine

## 2022-06-06 DIAGNOSIS — C73 Malignant neoplasm of thyroid gland: Secondary | ICD-10-CM | POA: Diagnosis not present

## 2022-06-06 DIAGNOSIS — Z85850 Personal history of malignant neoplasm of thyroid: Secondary | ICD-10-CM | POA: Diagnosis not present

## 2022-06-06 DIAGNOSIS — D472 Monoclonal gammopathy: Secondary | ICD-10-CM | POA: Diagnosis not present

## 2022-06-06 DIAGNOSIS — N183 Chronic kidney disease, stage 3 unspecified: Secondary | ICD-10-CM | POA: Diagnosis not present

## 2022-06-06 DIAGNOSIS — Z79899 Other long term (current) drug therapy: Secondary | ICD-10-CM | POA: Diagnosis not present

## 2022-06-06 DIAGNOSIS — D509 Iron deficiency anemia, unspecified: Secondary | ICD-10-CM | POA: Diagnosis not present

## 2022-06-06 NOTE — Progress Notes (Signed)
Pt in for follow up, denies any concerns or changes.

## 2022-06-06 NOTE — Progress Notes (Signed)
[place] [facility] OFFICE PROGRESS NOTE  Patient Care Team: Adin Hector, MD as PCP - General (Internal Medicine) Cammie Sickle, MD as Medical Oncologist (Hematology and Oncology)   Cancer Staging  No matching staging information was found for the patient.   Oncology History Overview Note  1999- right thyroid lobectomy by Dr. Orene Desanctis with unusual poorly differentiated cancer, no additional treatment  2015- recurrent mass in thyroid bed. PET-CT showed FDG avid lesion in thyroid bed + nasopharyx.  2015- Dr. 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 Margins not noted [facility], [place], #[id number]).   01/05/2014- Nasopharyngeal biopsy negative for malignancy  04/25/14- completed postoperative XRT completing 65.25 CGy in 29 fractions  05/2014 + 07/2014- PET avidity at the left nasopharynx and left oropharyngeal soft tissue as well as a hypermetabolic 1.5 cm mass in the right thyroid bed. Dr Johney Frame excised the paratracheal mass on 09/02/2014. Inspect of the nasopharynx at the time of surgery revealed no obvious abnormality and no biopsy was completed.   09/2014- NP avid area biopsied and negative for carcinoma. SHe underwent a thyroid FNA and that was negative for cancer.  # Chronic Anemia- IDA [ EGD- hiatal hernia; Jan 2017- Dr.Rein] AUG 2017- IV Venofer  # AUG 2017- Thyroglobulin 40/Sep 2017 US neck- NED  # Hoarseness of voice- sec to RT [Dr.Bennett; Dec 2017]  # AUG 2017- IgG kappa 0.4gm//N-kappa-Lamda light chains     Malignant neoplasm of thyroid gland ([place])  09/01/2014 Initial Diagnosis   Malignant neoplasm of thyroid gland ([place])     INTERVAL HISTORY: Alone.  Ambulating with a cane.   Sara Fields 81 y.o.  female pleasant patient above history of thyroid cancer; and  MGUS; diagnosis of iron  deficiency anemia unclear etiology is here for follow-up.   Otherwise patient denies any new shortness of breath or cough he denies any worsening lumps or bumps in the neck.  No blood in stools no black or stools.  Review of Systems  Constitutional:  Positive for malaise/fatigue. Negative for chills, diaphoresis, fever and weight loss.  HENT:  Negative for nosebleeds and sore throat.   Eyes:  Negative for double vision.  Respiratory:  Negative for cough, hemoptysis, sputum production, shortness of breath and wheezing.   Cardiovascular:  Negative for chest pain, palpitations, orthopnea and leg swelling.  Gastrointestinal:  Negative for abdominal pain, blood in stool, constipation, diarrhea, heartburn, melena, nausea and vomiting.  Genitourinary:  Negative for dysuria, frequency and urgency.  Musculoskeletal:  Positive for neck pain. Negative for back pain and joint pain.  Skin: Negative.  Negative for itching and rash.  Neurological:  Negative for dizziness, tingling, focal weakness, weakness and headaches.  Endo/Heme/Allergies:  Does not bruise/bleed easily.  Psychiatric/Behavioral:  Negative for depression. The patient is not nervous/anxious and does not have insomnia.      PAST MEDICAL HISTORY :  Past Medical History:  Diagnosis Date   Anemia    Asthma    COPD (chronic obstructive pulmonary disease) (HCC)    Dyspnea    GERD (gastroesophageal reflux disease)    Hyperlipidemia    Hypertension    Thyroid cancer ([place]) 1999 and 2015   Partial thyroidectomy with rad tx's.     PAST SURGICAL HISTORY :   Past Surgical History:  Procedure Laterality Date   ABDOMINAL HYSTERECTOMY  1973  partial   COLONOSCOPY WITH PROPOFOL N/A 09/08/2021   Procedure: COLONOSCOPY WITH PROPOFOL;  Surgeon: Robert Bellow, MD;  Location: [facility] ENDOSCOPY;  Service: Endoscopy;  Laterality: N/A;   ESOPHAGOGASTRODUODENOSCOPY N/A 02/05/2017   Procedure: ESOPHAGOGASTRODUODENOSCOPY (EGD);  Surgeon: Wilford Corner, MD;  Location: [facility] ENDOSCOPY;  Service: Endoscopy;  Laterality: N/A;   ESOPHAGOGASTRODUODENOSCOPY (EGD) WITH PROPOFOL N/A 11/16/2015   Procedure: ESOPHAGOGASTRODUODENOSCOPY (EGD) WITH PROPOFOL;  Surgeon: Josefine Class, MD;  Location: [facility] ENDOSCOPY;  Service: Endoscopy;  Laterality: N/A;   ESOPHAGOGASTRODUODENOSCOPY (EGD) WITH PROPOFOL N/A 09/08/2021   Procedure: ESOPHAGOGASTRODUODENOSCOPY (EGD) WITH PROPOFOL;  Surgeon: Robert Bellow, MD;  Location: [facility] ENDOSCOPY;  Service: Endoscopy;  Laterality: N/A;   THYROID SURGERY  1999 and 2015   Partial Thyroidectomy    FAMILY HISTORY :   Family History  Problem Relation Age of Onset   Lung cancer Father    Hypertension Father    Bone cancer Sister    Hypertension Sister    Lung cancer Brother    Hypertension Brother    Kidney cancer Sister    Hypertension Sister    Lung cancer Sister    Hypertension Sister    Breast cancer Neg Hx     SOCIAL HISTORY:   Social History   Tobacco Use   Smoking status: Never   Smokeless tobacco: Never  Vaping Use   Vaping Use: Never used  Substance Use Topics   Alcohol use: No   Drug use: No    ALLERGIES:  has No Known Allergies.  MEDICATIONS:  Current Outpatient Medications  Medication Sig Dispense Refill   albuterol (PROVENTIL HFA;VENTOLIN HFA) 108 (90 Base) MCG/ACT inhaler Inhale into the lungs every 6 (six) hours as needed for wheezing or shortness of breath.     allopurinol (ZYLOPRIM) 100 MG tablet Take 100 mg by mouth daily.     amLODipine (NORVASC) 5 MG tablet Take 1 tablet by mouth.     azelastine (ASTELIN) 0.1 % nasal spray Place 1 spray into both nostrils daily as needed for rhinitis or allergies.     bumetanide (BUMEX) 1 MG tablet Take 1 mg by mouth daily.     calcium carbonate (OS-CAL) 600 MG TABS tablet Take 600 mg by mouth 2 (two) times daily with a meal.     ipratropium-albuterol (DUONEB) 0.5-2.5 (3) MG/3ML SOLN Inhale 3 mLs into the lungs in the morning, at  noon, in the evening, and at bedtime.     levothyroxine (SYNTHROID) 88 MCG tablet TAKE 1 TABLET ONCE DAILY TAKE ON AN EMPTY STOMACH WITH A GLASS OF WATER AT LEAST 30-60 MINUTES BEFORE BREAKFAST     LORazepam (ATIVAN) 0.5 MG tablet Take 0.5 mg by mouth every 8 (eight) hours as needed for anxiety.      montelukast (SINGULAIR) 10 MG tablet Take 10 mg by mouth at bedtime.     pantoprazole (PROTONIX) 40 MG tablet Take 1 tablet (40 mg total) by mouth 2 (two) times daily before a meal. 60 tablet 1   potassium chloride SA (KLOR-CON) 20 MEQ tablet Take 20 mEq by mouth daily.      propranolol (INDERAL) 20 MG tablet Take 20 mg by mouth 3 (three) times daily.     rosuvastatin (CRESTOR) 10 MG tablet Take 10 mg by mouth daily.      traMADol (ULTRAM) 50 MG tablet Take 50 mg by mouth every 6 (six) hours as needed.     meclizine (ANTIVERT) 12.5 MG tablet Take 12.5 mg by mouth 2 (  two) times daily as needed for dizziness.  (Patient not taking: Reported on 06/02/2021)     No current facility-administered medications for this visit.   Facility-Administered Medications Ordered in Other Visits  Medication Dose Route Frequency Provider Last Rate Last Admin   iron sucrose (VENOFER) 200 mg IVPB  200 mg Intravenous Once Cammie Sickle, MD        PHYSICAL EXAMINATION: ECOG PERFORMANCE STATUS:   BP 125/70 (BP Location: Right Arm, Patient Position: Sitting)   Pulse 68   Temp (!) 96.9 F (36.1 C) (Tympanic)   Resp 16   Wt 186 lb (84.4 kg)   SpO2 99%   BMI 29.13 kg/m   Filed Weights   06/06/22 1332  Weight: 186 lb (84.4 kg)    Physical Exam Constitutional:      Comments: Alone.  Ambulating with a cane.  HENT:     Head: Normocephalic and atraumatic.     Mouth/Throat:     Pharynx: No oropharyngeal exudate.  Eyes:     Pupils: Pupils are equal, round, and reactive to light.  Neck:     Comments: Chronic scarring changes noted in the neck bilaterally.  No swelling noted. Cardiovascular:     Rate and  Rhythm: Normal rate and regular rhythm.  Pulmonary:     Effort: Pulmonary effort is normal. No respiratory distress.     Breath sounds: Normal breath sounds. No wheezing.  Abdominal:     General: Bowel sounds are normal. There is no distension.     Palpations: Abdomen is soft. There is no mass.     Tenderness: There is no abdominal tenderness. There is no guarding or rebound.  Musculoskeletal:        General: No tenderness. Normal range of motion.     Cervical back: Normal range of motion and neck supple.  Skin:    General: Skin is warm.  Neurological:     Mental Status: She is alert and oriented to person, place, and time.  Psychiatric:        Mood and Affect: Affect normal.      LABORATORY DATA:  I have reviewed the data as listed    Component Value Date/Time   NA 139 05/30/2022 1329   K 3.6 05/30/2022 1329   CL 105 05/30/2022 1329   CO2 25 05/30/2022 1329   GLUCOSE 114 (H) 05/30/2022 1329   BUN 15 05/30/2022 1329   CREATININE 1.29 (H) 05/30/2022 1329   CALCIUM 9.0 05/30/2022 1329   PROT 7.8 05/03/2019 1113   ALBUMIN 4.2 05/03/2019 1113   AST 23 05/03/2019 1113   ALT 13 05/03/2019 1113   ALKPHOS 88 05/03/2019 1113   BILITOT 0.7 05/03/2019 1113   GFRNONAA 42 (L) 05/30/2022 1329   GFRAA 58 (L) 05/26/2020 1358    No results found for: "SPEP", "UPEP"  Lab Results  Component Value Date   WBC 6.2 05/30/2022   NEUTROABS 4.7 05/30/2022   HGB 11.7 (L) 05/30/2022   HCT 35.8 (L) 05/30/2022   MCV 95.2 05/30/2022   PLT 290 05/30/2022      Chemistry      Component Value Date/Time   NA 139 05/30/2022 1329   K 3.6 05/30/2022 1329   CL 105 05/30/2022 1329   CO2 25 05/30/2022 1329   BUN 15 05/30/2022 1329   CREATININE 1.29 (H) 05/30/2022 1329      Component Value Date/Time   CALCIUM 9.0 05/30/2022 1329   ALKPHOS 88 05/03/2019 1113   AST  23 05/03/2019 1113   ALT 13 05/03/2019 1113   BILITOT 0.7 05/03/2019 1113       RADIOGRAPHIC STUDIES: I have personally  reviewed the radiological images as listed and agreed with the findings in the report. No results found.   ASSESSMENT & PLAN:  Malignant neoplasm of thyroid gland ([place]) # Thyroid ca with local reurence s/p surgery [[facility]; EBRT [2015; no RAI].  No clinical no evidence of recurrence.  Thyroglobulin-normal/ STABLE.  2023 TSH - 0.78 continue current dose of Synthroid- STABLE.   #Iron deficient anemia-unclear etiology/CKD III hemoglobin- STABLE 11.7 STABLE; sat-23%;    Continue p.o. iron.  Hold off IV iron infusion.   # EJYL-TEI 3539-- IgGKappa-0.5 [prior 0.3-0.6] g/dL; kappa lambda light chain: 1.77-   STABLE.  # CKD stage III- STABLE; GFR-42.   # DISPOSITION: # NO IV venofer infusion. # follow up 6 months- MD; Reva Bores- 1 week prior-  cbc/bmp-possible venofer; / Thyroid profile; Thyroglobulin profile;  iron studies/ferritin; MM panel; K/L light chains-Dr.B  Cc; Dr.Klein.    Orders Placed This Encounter  Procedures   CBC with Differential/Platelet    Standing Status:   Future    Standing Expiration Date:   11/14/5832   Basic metabolic panel    Standing Status:   Future    Standing Expiration Date:   06/07/2023   Thyroid Panel With TSH    Standing Status:   Future    Standing Expiration Date:   06/07/2023   Iron and TIBC    Standing Status:   Future    Standing Expiration Date:   06/07/2023   Ferritin    Standing Status:   Future    Standing Expiration Date:   06/07/2023   Multiple Myeloma Panel (SPEP&IFE w/QIG)    Standing Status:   Future    Standing Expiration Date:   06/07/2023   Kappa/lambda light chains    Standing Status:   Future    Standing Expiration Date:   06/07/2023   TgAb+Thyroglobulin IMA or RIA    Standing Status:   Future    Standing Expiration Date:   06/07/2023   All questions were answered. The patient knows to call the clinic with any problems, questions or concerns.     Cammie Sickle, MD 06/06/2022 2:07 PM

## 2022-06-06 NOTE — Assessment & Plan Note (Signed)
#   Thyroid ca with local reurence s/p surgery [[facility]; EBRT [2015; no RAI].  No clinical no evidence of recurrence.  Thyroglobulin-normal/ STABLE.  2023 TSH - 0.78 continue current dose of Synthroid- STABLE.   #Iron deficient anemia-unclear etiology/CKD III hemoglobin- STABLE 11.7 STABLE; sat-23%;    Continue p.o. iron.  Hold off IV iron infusion.   # XMIW-OEH 2122-- IgGKappa-0.5 [prior 0.3-0.6] g/dL; kappa lambda light chain: 1.77-   STABLE.  # CKD stage III- STABLE; GFR-42.   # DISPOSITION: # NO IV venofer infusion. # follow up 6 months- MD; Reva Bores- 1 week prior-  cbc/bmp-possible venofer; / Thyroid profile; Thyroglobulin profile;  iron studies/ferritin; MM panel; K/L light chains-Dr.B  Cc; Dr.Klein.

## 2022-07-11 DIAGNOSIS — E538 Deficiency of other specified B group vitamins: Secondary | ICD-10-CM | POA: Diagnosis not present

## 2022-07-11 DIAGNOSIS — D649 Anemia, unspecified: Secondary | ICD-10-CM | POA: Diagnosis not present

## 2022-07-11 DIAGNOSIS — Z79891 Long term (current) use of opiate analgesic: Secondary | ICD-10-CM | POA: Diagnosis not present

## 2022-07-11 DIAGNOSIS — N1831 Chronic kidney disease, stage 3a: Secondary | ICD-10-CM | POA: Diagnosis not present

## 2022-07-11 DIAGNOSIS — M109 Gout, unspecified: Secondary | ICD-10-CM | POA: Diagnosis not present

## 2022-07-11 DIAGNOSIS — N2581 Secondary hyperparathyroidism of renal origin: Secondary | ICD-10-CM | POA: Diagnosis not present

## 2022-07-11 DIAGNOSIS — E785 Hyperlipidemia, unspecified: Secondary | ICD-10-CM | POA: Diagnosis not present

## 2022-07-18 DIAGNOSIS — E538 Deficiency of other specified B group vitamins: Secondary | ICD-10-CM | POA: Diagnosis not present

## 2022-07-18 DIAGNOSIS — N2581 Secondary hyperparathyroidism of renal origin: Secondary | ICD-10-CM | POA: Diagnosis not present

## 2022-07-18 DIAGNOSIS — Z23 Encounter for immunization: Secondary | ICD-10-CM | POA: Diagnosis not present

## 2022-07-18 DIAGNOSIS — J8410 Pulmonary fibrosis, unspecified: Secondary | ICD-10-CM | POA: Diagnosis not present

## 2022-07-18 DIAGNOSIS — C73 Malignant neoplasm of thyroid gland: Secondary | ICD-10-CM | POA: Diagnosis not present

## 2022-07-18 DIAGNOSIS — D649 Anemia, unspecified: Secondary | ICD-10-CM | POA: Diagnosis not present

## 2022-07-18 DIAGNOSIS — D472 Monoclonal gammopathy: Secondary | ICD-10-CM | POA: Diagnosis not present

## 2022-07-18 DIAGNOSIS — Z79891 Long term (current) use of opiate analgesic: Secondary | ICD-10-CM | POA: Diagnosis not present

## 2022-07-18 DIAGNOSIS — N183 Chronic kidney disease, stage 3 unspecified: Secondary | ICD-10-CM | POA: Diagnosis not present

## 2022-07-18 DIAGNOSIS — E785 Hyperlipidemia, unspecified: Secondary | ICD-10-CM | POA: Diagnosis not present

## 2022-07-18 DIAGNOSIS — D631 Anemia in chronic kidney disease: Secondary | ICD-10-CM | POA: Diagnosis not present

## 2022-07-18 DIAGNOSIS — M109 Gout, unspecified: Secondary | ICD-10-CM | POA: Diagnosis not present

## 2022-07-18 DIAGNOSIS — N1831 Chronic kidney disease, stage 3a: Secondary | ICD-10-CM | POA: Diagnosis not present

## 2022-07-18 DIAGNOSIS — I129 Hypertensive chronic kidney disease with stage 1 through stage 4 chronic kidney disease, or unspecified chronic kidney disease: Secondary | ICD-10-CM | POA: Diagnosis not present

## 2022-08-01 DIAGNOSIS — K219 Gastro-esophageal reflux disease without esophagitis: Secondary | ICD-10-CM | POA: Diagnosis not present

## 2022-08-01 DIAGNOSIS — Z780 Asymptomatic menopausal state: Secondary | ICD-10-CM | POA: Diagnosis not present

## 2022-08-01 DIAGNOSIS — J8410 Pulmonary fibrosis, unspecified: Secondary | ICD-10-CM | POA: Diagnosis not present

## 2022-08-01 DIAGNOSIS — J4520 Mild intermittent asthma, uncomplicated: Secondary | ICD-10-CM | POA: Diagnosis not present

## 2022-08-01 DIAGNOSIS — J849 Interstitial pulmonary disease, unspecified: Secondary | ICD-10-CM | POA: Diagnosis not present

## 2022-08-03 ENCOUNTER — Other Ambulatory Visit: Payer: Self-pay | Admitting: Specialist

## 2022-08-03 DIAGNOSIS — J849 Interstitial pulmonary disease, unspecified: Secondary | ICD-10-CM

## 2022-08-17 ENCOUNTER — Ambulatory Visit
Admission: RE | Admit: 2022-08-17 | Discharge: 2022-08-17 | Disposition: A | Discharge status: Home / Self Care | Payer: Medicare HMO | Source: Ambulatory Visit | Attending: Specialist | Admitting: Specialist

## 2022-08-17 DIAGNOSIS — J849 Interstitial pulmonary disease, unspecified: Secondary | ICD-10-CM

## 2022-08-17 DIAGNOSIS — I700 Atherosclerosis of aorta: Secondary | ICD-10-CM | POA: Diagnosis not present

## 2022-11-23 ENCOUNTER — Encounter: Payer: Self-pay | Admitting: Internal Medicine

## 2022-11-30 ENCOUNTER — Inpatient Hospital Stay: Payer: Medicare PPO | Attending: Internal Medicine

## 2022-11-30 DIAGNOSIS — Z79620 Long term (current) use of immunosuppressive biologic: Secondary | ICD-10-CM | POA: Insufficient documentation

## 2022-11-30 DIAGNOSIS — Z79899 Other long term (current) drug therapy: Secondary | ICD-10-CM | POA: Insufficient documentation

## 2022-11-30 DIAGNOSIS — C73 Malignant neoplasm of thyroid gland: Secondary | ICD-10-CM | POA: Insufficient documentation

## 2022-11-30 DIAGNOSIS — N183 Chronic kidney disease, stage 3 unspecified: Secondary | ICD-10-CM | POA: Insufficient documentation

## 2022-11-30 DIAGNOSIS — D509 Iron deficiency anemia, unspecified: Secondary | ICD-10-CM | POA: Insufficient documentation

## 2022-11-30 DIAGNOSIS — D472 Monoclonal gammopathy: Secondary | ICD-10-CM | POA: Insufficient documentation

## 2022-12-02 ENCOUNTER — Encounter: Payer: Self-pay | Admitting: Internal Medicine

## 2022-12-05 ENCOUNTER — Inpatient Hospital Stay: Payer: Medicare PPO

## 2022-12-05 DIAGNOSIS — C73 Malignant neoplasm of thyroid gland: Secondary | ICD-10-CM | POA: Diagnosis present

## 2022-12-05 DIAGNOSIS — N183 Chronic kidney disease, stage 3 unspecified: Secondary | ICD-10-CM | POA: Diagnosis not present

## 2022-12-05 DIAGNOSIS — Z79620 Long term (current) use of immunosuppressive biologic: Secondary | ICD-10-CM | POA: Diagnosis not present

## 2022-12-05 DIAGNOSIS — Z79899 Other long term (current) drug therapy: Secondary | ICD-10-CM | POA: Diagnosis not present

## 2022-12-05 DIAGNOSIS — D509 Iron deficiency anemia, unspecified: Secondary | ICD-10-CM | POA: Diagnosis not present

## 2022-12-05 DIAGNOSIS — D472 Monoclonal gammopathy: Secondary | ICD-10-CM | POA: Diagnosis not present

## 2022-12-05 LAB — CBC WITH DIFFERENTIAL/PLATELET
Abs Immature Granulocytes: 0.05 10*3/uL (ref 0.00–0.07)
Basophils Absolute: 0 10*3/uL (ref 0.0–0.1)
Basophils Relative: 0 %
Eosinophils Absolute: 0 10*3/uL (ref 0.0–0.5)
Eosinophils Relative: 0 %
HCT: 34.4 % — ABNORMAL LOW (ref 36.0–46.0)
Hemoglobin: 11.3 g/dL — ABNORMAL LOW (ref 12.0–15.0)
Immature Granulocytes: 1 %
Lymphocytes Relative: 15 %
Lymphs Abs: 1.4 10*3/uL (ref 0.7–4.0)
MCH: 31 pg (ref 26.0–34.0)
MCHC: 32.8 g/dL (ref 30.0–36.0)
MCV: 94.5 fL (ref 80.0–100.0)
Monocytes Absolute: 0.6 10*3/uL (ref 0.1–1.0)
Monocytes Relative: 6 %
Neutro Abs: 7.4 10*3/uL (ref 1.7–7.7)
Neutrophils Relative %: 78 %
Platelets: 324 10*3/uL (ref 150–400)
RBC: 3.64 MIL/uL — ABNORMAL LOW (ref 3.87–5.11)
RDW: 14.2 % (ref 11.5–15.5)
WBC: 9.4 10*3/uL (ref 4.0–10.5)
nRBC: 0 % (ref 0.0–0.2)

## 2022-12-05 LAB — BASIC METABOLIC PANEL
Anion gap: 10 (ref 5–15)
BUN: 22 mg/dL (ref 8–23)
CO2: 21 mmol/L — ABNORMAL LOW (ref 22–32)
Calcium: 8.6 mg/dL — ABNORMAL LOW (ref 8.9–10.3)
Chloride: 104 mmol/L (ref 98–111)
Creatinine, Ser: 1.15 mg/dL — ABNORMAL HIGH (ref 0.44–1.00)
GFR, Estimated: 48 mL/min — ABNORMAL LOW (ref >60)
Glucose, Bld: 91 mg/dL (ref 70–99)
Potassium: 3.8 mmol/L (ref 3.5–5.1)
Sodium: 135 mmol/L (ref 135–145)

## 2022-12-05 LAB — IRON AND TIBC
Iron: 71 ug/dL (ref 28–170)
Saturation Ratios: 27 % (ref 10.4–31.8)
TIBC: 266 ug/dL (ref 250–450)
UIBC: 195 ug/dL

## 2022-12-05 LAB — FERRITIN: Ferritin: 394 ng/mL — ABNORMAL HIGH (ref 11–307)

## 2022-12-06 LAB — KAPPA/LAMBDA LIGHT CHAINS
Kappa free light chain: 32.7 mg/L — ABNORMAL HIGH (ref 3.3–19.4)
Kappa, lambda light chain ratio: 1.79 — ABNORMAL HIGH (ref 0.26–1.65)
Lambda free light chains: 18.3 mg/L (ref 5.7–26.3)

## 2022-12-06 LAB — TGAB+THYROGLOBULIN IMA OR RIA: Thyroglobulin Antibody: <1 IU/mL (ref 0.0–0.9)

## 2022-12-06 LAB — THYROGLOBULIN BY IMA: Thyroglobulin by IMA: 7.7 ng/mL (ref 1.5–38.5)

## 2022-12-06 MED FILL — Iron Sucrose Inj 20 MG/ML (Fe Equiv): INTRAVENOUS | Qty: 10 | Status: AC

## 2022-12-07 ENCOUNTER — Inpatient Hospital Stay: Payer: Medicare PPO

## 2022-12-07 ENCOUNTER — Inpatient Hospital Stay (HOSPITAL_BASED_OUTPATIENT_CLINIC_OR_DEPARTMENT_OTHER): Payer: Medicare PPO | Admitting: Internal Medicine

## 2022-12-07 ENCOUNTER — Encounter: Payer: Self-pay | Admitting: Internal Medicine

## 2022-12-07 VITALS — BP 124/80 | HR 73 | Temp 96.9°F | Resp 16 | Wt 190.6 lb

## 2022-12-07 DIAGNOSIS — C73 Malignant neoplasm of thyroid gland: Secondary | ICD-10-CM | POA: Diagnosis not present

## 2022-12-07 LAB — THYROID PANEL WITH TSH
Free Thyroxine Index: 2.3 (ref 1.2–4.9)
T3 Uptake Ratio: 30 % (ref 24–39)
T4, Total: 7.6 ug/dL (ref 4.5–12.0)
TSH: 1.11 u[IU]/mL (ref 0.450–4.500)

## 2022-12-07 NOTE — Progress Notes (Signed)
Patient denies new problems/concerns today.    Currently on Prednisone for COPD exacerbation.

## 2022-12-07 NOTE — Progress Notes (Signed)
[facility] OFFICE PROGRESS NOTE  Patient Care Team: Adin Hector, MD as PCP - General (Internal Medicine) Cammie Sickle, MD as Medical Oncologist (Hematology and Oncology)   Cancer Staging  No matching staging information was found for the patient.   Oncology History Overview Note  1999- right thyroid lobectomy by Dr. Orene Desanctis with unusual poorly differentiated cancer, no additional treatment  2015- recurrent mass in thyroid bed. PET-CT showed FDG avid lesion in thyroid bed + nasopharyx.  2015- Dr. 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 Margins not noted [facility], [place], #[id number]).   01/05/2014- Nasopharyngeal biopsy negative for malignancy  04/25/14- completed postoperative XRT completing 65.25 CGy in 29 fractions  05/2014 + 07/2014- PET avidity at the left nasopharynx and left oropharyngeal soft tissue as well as a hypermetabolic 1.5 cm mass in the right thyroid bed. Dr Johney Frame excised the paratracheal mass on 09/02/2014. Inspect of the nasopharynx at the time of surgery revealed no obvious abnormality and no biopsy was completed.   09/2014- NP avid area biopsied and negative for carcinoma. SHe underwent a thyroid FNA and that was negative for cancer.  # Chronic Anemia- IDA [ EGD- hiatal hernia; Jan 2017- Dr.Rein] AUG 2017- IV Venofer  # AUG 2017- Thyroglobulin 40/Sep 2017 US neck- NED  # Hoarseness of voice- sec to RT [Dr.Bennett; Dec 2017]  # AUG 2017- IgG kappa 0.4gm//N-kappa-Lamda light chains     Malignant neoplasm of thyroid gland ([place])  09/01/2014 Initial Diagnosis   Malignant neoplasm of thyroid gland ([place])     INTERVAL HISTORY: Alone.  Ambulating with a cane.   Sara Fields 81 y.o.  female pleasant patient above history of thyroid cancer; and  MGUS; diagnosis of iron  deficiency anemia unclear etiology is here for follow-up.   Patient denies new problems/concerns today.  Currently on Prednisone for COPD exacerbation  Otherwise patient denies any new shortness of breath or cough he denies any worsening lumps or bumps in the neck.  No blood in stools no black or stools.  Review of Systems  Constitutional:  Positive for malaise/fatigue. Negative for chills, diaphoresis, fever and weight loss.  HENT:  Negative for nosebleeds and sore throat.   Eyes:  Negative for double vision.  Respiratory:  Negative for cough, hemoptysis, sputum production, shortness of breath and wheezing.   Cardiovascular:  Negative for chest pain, palpitations, orthopnea and leg swelling.  Gastrointestinal:  Negative for abdominal pain, blood in stool, constipation, diarrhea, heartburn, melena, nausea and vomiting.  Genitourinary:  Negative for dysuria, frequency and urgency.  Musculoskeletal:  Positive for neck pain. Negative for back pain and joint pain.  Skin: Negative.  Negative for itching and rash.  Neurological:  Negative for dizziness, tingling, focal weakness, weakness and headaches.  Endo/Heme/Allergies:  Does not bruise/bleed easily.  Psychiatric/Behavioral:  Negative for depression. The patient is not nervous/anxious and does not have insomnia.      PAST MEDICAL HISTORY :  Past Medical History:  Diagnosis Date   Anemia    Asthma    COPD (chronic obstructive pulmonary disease) (HCC)    Dyspnea    GERD (gastroesophageal reflux disease)    Hyperlipidemia    Hypertension    Thyroid cancer ([place]) 1999 and 2015   Partial thyroidectomy with rad tx's.     PAST SURGICAL HISTORY :   Past  Surgical History:  Procedure Laterality Date   ABDOMINAL HYSTERECTOMY  1973   partial   COLONOSCOPY WITH PROPOFOL N/A 09/08/2021   Procedure: COLONOSCOPY WITH PROPOFOL;  Surgeon: Robert Bellow, MD;  Location: [facility] ENDOSCOPY;  Service: Endoscopy;  Laterality: N/A;    ESOPHAGOGASTRODUODENOSCOPY N/A 02/05/2017   Procedure: ESOPHAGOGASTRODUODENOSCOPY (EGD);  Surgeon: Wilford Corner, MD;  Location: [facility] ENDOSCOPY;  Service: Endoscopy;  Laterality: N/A;   ESOPHAGOGASTRODUODENOSCOPY (EGD) WITH PROPOFOL N/A 11/16/2015   Procedure: ESOPHAGOGASTRODUODENOSCOPY (EGD) WITH PROPOFOL;  Surgeon: Josefine Class, MD;  Location: [facility] ENDOSCOPY;  Service: Endoscopy;  Laterality: N/A;   ESOPHAGOGASTRODUODENOSCOPY (EGD) WITH PROPOFOL N/A 09/08/2021   Procedure: ESOPHAGOGASTRODUODENOSCOPY (EGD) WITH PROPOFOL;  Surgeon: Robert Bellow, MD;  Location: [facility] ENDOSCOPY;  Service: Endoscopy;  Laterality: N/A;   THYROID SURGERY  1999 and 2015   Partial Thyroidectomy    FAMILY HISTORY :   Family History  Problem Relation Age of Onset   Lung cancer Father    Hypertension Father    Bone cancer Sister    Hypertension Sister    Lung cancer Brother    Hypertension Brother    Kidney cancer Sister    Hypertension Sister    Lung cancer Sister    Hypertension Sister    Breast cancer Neg Hx     SOCIAL HISTORY:   Social History   Tobacco Use   Smoking status: Never   Smokeless tobacco: Never  Vaping Use   Vaping Use: Never used  Substance Use Topics   Alcohol use: No   Drug use: No    ALLERGIES:  has No Known Allergies.  MEDICATIONS:  Current Outpatient Medications  Medication Sig Dispense Refill   albuterol (PROVENTIL HFA;VENTOLIN HFA) 108 (90 Base) MCG/ACT inhaler Inhale into the lungs every 6 (six) hours as needed for wheezing or shortness of breath.     allopurinol (ZYLOPRIM) 100 MG tablet Take 100 mg by mouth daily.     amLODipine (NORVASC) 5 MG tablet Take 1 tablet by mouth.     ARNUITY ELLIPTA 100 MCG/ACT AEPB Inhale into the lungs.     azelastine (ASTELIN) 0.1 % nasal spray Place 1 spray into both nostrils daily as needed for rhinitis or allergies.     bumetanide (BUMEX) 1 MG tablet Take 1 mg by mouth daily.     calcium carbonate (OS-CAL) 600 MG TABS  tablet Take 600 mg by mouth 2 (two) times daily with a meal.     ferrous sulfate 325 (65 FE) MG tablet Take 325 mg by mouth 2 (two) times daily with a meal.     furosemide (LASIX) 40 MG tablet Take 40 mg by mouth daily.     ipratropium-albuterol (DUONEB) 0.5-2.5 (3) MG/3ML SOLN Inhale 3 mLs into the lungs in the morning, at noon, in the evening, and at bedtime.     levothyroxine (SYNTHROID) 88 MCG tablet TAKE 1 TABLET ONCE DAILY TAKE ON AN EMPTY STOMACH WITH A GLASS OF WATER AT LEAST 30-60 MINUTES BEFORE BREAKFAST     LORazepam (ATIVAN) 0.5 MG tablet Take 0.5 mg by mouth every 8 (eight) hours as needed for anxiety.      montelukast (SINGULAIR) 10 MG tablet Take 10 mg by mouth at bedtime.     pantoprazole (PROTONIX) 40 MG tablet Take 1 tablet (40 mg total) by mouth 2 (two) times daily before a meal. 60 tablet 1   potassium chloride SA (KLOR-CON) 20 MEQ tablet Take 20 mEq by mouth daily.  predniSONE (DELTASONE) 10 MG tablet 10 mg. Prednisone 10 mg, 4 pills q day x 2 days, 3 pills q day x 2 days, 2 pills q day x 2 days, 1 pill q day x 2 days.     propranolol (INDERAL) 20 MG tablet Take 20 mg by mouth 3 (three) times daily.     rosuvastatin (CRESTOR) 10 MG tablet Take 10 mg by mouth daily.      traMADol (ULTRAM) 50 MG tablet Take 50 mg by mouth every 6 (six) hours as needed.     meclizine (ANTIVERT) 12.5 MG tablet Take 12.5 mg by mouth 2 (two) times daily as needed for dizziness.  (Patient not taking: Reported on 06/02/2021)     No current facility-administered medications for this visit.   Facility-Administered Medications Ordered in Other Visits  Medication Dose Route Frequency Provider Last Rate Last Admin   iron sucrose (VENOFER) 200 mg IVPB  200 mg Intravenous Once Cammie Sickle, MD        PHYSICAL EXAMINATION: ECOG PERFORMANCE STATUS:   BP 124/80 (BP Location: Left Arm, Patient Position: Sitting)   Pulse 73   Temp (!) 96.9 F (36.1 C) (Tympanic)   Resp 16   Wt 190 lb 9.6 oz  (86.5 kg)   BMI 29.85 kg/m   Filed Weights   12/07/22 1300  Weight: 190 lb 9.6 oz (86.5 kg)    Physical Exam Constitutional:      Comments: Alone.  Ambulating with a cane.  HENT:     Head: Normocephalic and atraumatic.     Mouth/Throat:     Pharynx: No oropharyngeal exudate.  Eyes:     Pupils: Pupils are equal, round, and reactive to light.  Neck:     Comments: Chronic scarring changes noted in the neck bilaterally.  No swelling noted. Cardiovascular:     Rate and Rhythm: Normal rate and regular rhythm.  Pulmonary:     Effort: Pulmonary effort is normal. No respiratory distress.     Breath sounds: Normal breath sounds. No wheezing.  Abdominal:     General: Bowel sounds are normal. There is no distension.     Palpations: Abdomen is soft. There is no mass.     Tenderness: There is no abdominal tenderness. There is no guarding or rebound.  Musculoskeletal:        General: No tenderness. Normal range of motion.     Cervical back: Normal range of motion and neck supple.  Skin:    General: Skin is warm.  Neurological:     Mental Status: She is alert and oriented to person, place, and time.  Psychiatric:        Mood and Affect: Affect normal.      LABORATORY DATA:  I have reviewed the data as listed    Component Value Date/Time   NA 135 12/05/2022 0921   K 3.8 12/05/2022 0921   CL 104 12/05/2022 0921   CO2 21 (L) 12/05/2022 0921   GLUCOSE 91 12/05/2022 0921   BUN 22 12/05/2022 0921   CREATININE 1.15 (H) 12/05/2022 0921   CALCIUM 8.6 (L) 12/05/2022 0921   PROT 7.8 05/03/2019 1113   ALBUMIN 4.2 05/03/2019 1113   AST 23 05/03/2019 1113   ALT 13 05/03/2019 1113   ALKPHOS 88 05/03/2019 1113   BILITOT 0.7 05/03/2019 1113   GFRNONAA 48 (L) 12/05/2022 0921   GFRAA 58 (L) 05/26/2020 1358    No results found for: "SPEP", "UPEP"  Lab Results  Component  Value Date   WBC 9.4 12/05/2022   NEUTROABS 7.4 12/05/2022   HGB 11.3 (L) 12/05/2022   HCT 34.4 (L) 12/05/2022    MCV 94.5 12/05/2022   PLT 324 12/05/2022      Chemistry      Component Value Date/Time   NA 135 12/05/2022 0921   K 3.8 12/05/2022 0921   CL 104 12/05/2022 0921   CO2 21 (L) 12/05/2022 0921   BUN 22 12/05/2022 0921   CREATININE 1.15 (H) 12/05/2022 0921      Component Value Date/Time   CALCIUM 8.6 (L) 12/05/2022 0921   ALKPHOS 88 05/03/2019 1113   AST 23 05/03/2019 1113   ALT 13 05/03/2019 1113   BILITOT 0.7 05/03/2019 1113       RADIOGRAPHIC STUDIES: I have personally reviewed the radiological images as listed and agreed with the findings in the report. No results found.   ASSESSMENT & PLAN:  Malignant neoplasm of thyroid gland ([place]) # Thyroid ca with local reurence s/p surgery [[facility]; EBRT [2015; no RAI].  FEB 2024- Thyroglobulin tumor marker stable around 7.  TSH -1.0- continue Synthroid 75 mcg a day. Consider [place] of neck- next visit.   #Iron deficient anemia-unclear etiology hemoglobin stable 11- NO IDA-   Hold off any IV iron.  Continue p.o. iron.  # MGUS-AUG 2023 IgGKappa-0. 5g/dL; kappa lambda light chain ratio normal. FEB 2024-MM panel-pending;  K/L= 1.79. Discussed the risk of converting into multiple myeloma is very low about 1 %/year. Monitor for now.   # Stage III- GFR-48- stable.   # DISPOSITION: # NO infusion today.  # follow up 6 months- MD;-possible venofer; Reva Bores- 2 weeks PRIOR- cbc/bmp mm panel; thyroglobulin/ Thyroid profile; iron studies,ferritin-Dr.B  Cc; Dr.Klein.    Orders Placed This Encounter  Procedures   CBC with Differential ([facility] Only)    Standing Status:   Future    Standing Expiration Date:   [id number]   Basic Metabolic Panel - [place] Only    Standing Status:   Future    Standing Expiration Date:   12/08/2023   Kappa/lambda light chains    Standing Status:   Future    Standing Expiration Date:   12/08/2023   Multiple Myeloma Panel (SPEP&IFE w/QIG)    Standing Status:   Future    Standing Expiration Date:   12/08/2023    Thyroid Panel With TSH    Standing Status:   Future    Standing Expiration Date:   12/08/2023   TgAb+Thyroglobulin IMA or RIA    Standing Status:   Future    Standing Expiration Date:   12/08/2023   Iron and TIBC    Standing Status:   Future    Standing Expiration Date:   12/08/2023   Ferritin    Standing Status:   Future    Standing Expiration Date:   12/08/2023   All questions were answered. The patient knows to call the clinic with any problems, questions or concerns.     Cammie Sickle, MD 12/07/2022 2:22 PM

## 2022-12-07 NOTE — Assessment & Plan Note (Signed)
#   Thyroid ca with local reurence s/p surgery [[facility]; EBRT [2015; no RAI].  FEB 2024- Thyroglobulin tumor marker stable around 7.  TSH -1.0- continue Synthroid 75 mcg a day. Consider [place] of neck- next visit.   #Iron deficient anemia-unclear etiology hemoglobin stable 11- NO IDA-   Hold off any IV iron.  Continue p.o. iron.  # MGUS-AUG 2023 IgGKappa-0. 5g/dL; kappa lambda light chain ratio normal. FEB 2024-MM panel-pending;  K/L= 1.79. Discussed the risk of converting into multiple myeloma is very low about 1 %/year. Monitor for now.   # Stage III- GFR-48- stable.   # DISPOSITION: # NO infusion today.  # follow up 6 months- MD;-possible venofer; Reva Bores- 2 weeks PRIOR- cbc/bmp mm panel; thyroglobulin/ Thyroid profile; iron studies,ferritin-Dr.B  Cc; Dr.Klein.

## 2022-12-13 LAB — MULTIPLE MYELOMA PANEL, SERUM
Albumin SerPl Elph-Mcnc: 3.6 g/dL (ref 2.9–4.4)
Albumin/Glob SerPl: 1.2 (ref 0.7–1.7)
Alpha 1: 0.2 g/dL (ref 0.0–0.4)
Alpha2 Glob SerPl Elph-Mcnc: 0.8 g/dL (ref 0.4–1.0)
B-Globulin SerPl Elph-Mcnc: 0.8 g/dL (ref 0.7–1.3)
Gamma Glob SerPl Elph-Mcnc: 1.3 g/dL (ref 0.4–1.8)
Globulin, Total: 3.1 g/dL (ref 2.2–3.9)
IgA: 214 mg/dL (ref 64–422)
IgG (Immunoglobin G), Serum: 1303 mg/dL (ref 586–1602)
IgM (Immunoglobulin M), Srm: 154 mg/dL (ref 26–217)
M Protein SerPl Elph-Mcnc: 0.7 g/dL — ABNORMAL HIGH
Total Protein ELP: 6.7 g/dL (ref 6.0–8.5)

## 2023-05-24 ENCOUNTER — Other Ambulatory Visit: Payer: Medicare PPO

## 2023-06-07 ENCOUNTER — Ambulatory Visit: Payer: Medicare PPO

## 2023-06-07 ENCOUNTER — Ambulatory Visit: Payer: Medicare PPO | Admitting: Internal Medicine

## 2023-06-16 ENCOUNTER — Encounter: Payer: Self-pay | Admitting: Internal Medicine

## 2023-07-03 ENCOUNTER — Encounter: Payer: Self-pay | Admitting: Internal Medicine

## 2023-07-10 ENCOUNTER — Inpatient Hospital Stay: Payer: No Typology Code available for payment source | Attending: Internal Medicine

## 2023-07-10 DIAGNOSIS — D509 Iron deficiency anemia, unspecified: Secondary | ICD-10-CM | POA: Diagnosis not present

## 2023-07-10 DIAGNOSIS — Z79899 Other long term (current) drug therapy: Secondary | ICD-10-CM | POA: Insufficient documentation

## 2023-07-10 DIAGNOSIS — Z79620 Long term (current) use of immunosuppressive biologic: Secondary | ICD-10-CM | POA: Diagnosis not present

## 2023-07-10 DIAGNOSIS — C73 Malignant neoplasm of thyroid gland: Secondary | ICD-10-CM | POA: Insufficient documentation

## 2023-07-10 DIAGNOSIS — D472 Monoclonal gammopathy: Secondary | ICD-10-CM | POA: Diagnosis not present

## 2023-07-10 LAB — CBC WITH DIFFERENTIAL (CANCER CENTER ONLY)
Abs Immature Granulocytes: 0.02 10*3/uL (ref 0.00–0.07)
Basophils Absolute: 0.1 10*3/uL (ref 0.0–0.1)
Basophils Relative: 1 %
Eosinophils Absolute: 0.3 10*3/uL (ref 0.0–0.5)
Eosinophils Relative: 4 %
HCT: 36.5 % (ref 36.0–46.0)
Hemoglobin: 11.7 g/dL — ABNORMAL LOW (ref 12.0–15.0)
Immature Granulocytes: 0 %
Lymphocytes Relative: 19 %
Lymphs Abs: 1.1 10*3/uL (ref 0.7–4.0)
MCH: 31.3 pg (ref 26.0–34.0)
MCHC: 32.1 g/dL (ref 30.0–36.0)
MCV: 97.6 fL (ref 80.0–100.0)
Monocytes Absolute: 0.4 10*3/uL (ref 0.1–1.0)
Monocytes Relative: 7 %
Neutro Abs: 4.1 10*3/uL (ref 1.7–7.7)
Neutrophils Relative %: 69 %
Platelet Count: 323 10*3/uL (ref 150–400)
RBC: 3.74 MIL/uL — ABNORMAL LOW (ref 3.87–5.11)
RDW: 13.6 % (ref 11.5–15.5)
WBC Count: 6 10*3/uL (ref 4.0–10.5)
nRBC: 0 % (ref 0.0–0.2)

## 2023-07-10 LAB — BASIC METABOLIC PANEL - CANCER CENTER ONLY
Anion gap: 7 (ref 5–15)
BUN: 20 mg/dL (ref 8–23)
CO2: 26 mmol/L (ref 22–32)
Calcium: 9.1 mg/dL (ref 8.9–10.3)
Chloride: 102 mmol/L (ref 98–111)
Creatinine: 1.34 mg/dL — ABNORMAL HIGH (ref 0.44–1.00)
GFR, Estimated: 40 mL/min — ABNORMAL LOW (ref >60)
Glucose, Bld: 106 mg/dL — ABNORMAL HIGH (ref 70–99)
Potassium: 3.6 mmol/L (ref 3.5–5.1)
Sodium: 135 mmol/L (ref 135–145)

## 2023-07-10 LAB — IRON AND TIBC
Iron: 76 ug/dL (ref 28–170)
Saturation Ratios: 31 % (ref 10.4–31.8)
TIBC: 248 ug/dL — ABNORMAL LOW (ref 250–450)
UIBC: 172 ug/dL

## 2023-07-10 LAB — FERRITIN: Ferritin: 534 ng/mL — ABNORMAL HIGH (ref 11–307)

## 2023-07-11 LAB — THYROGLOBULIN BY IMA: Thyroglobulin by IMA: 2.8 ng/mL (ref 1.5–38.5)

## 2023-07-11 LAB — TGAB+THYROGLOBULIN IMA OR RIA: Thyroglobulin Antibody: <1 [IU]/mL (ref 0.0–0.9)

## 2023-07-11 LAB — THYROID PANEL WITH TSH
Free Thyroxine Index: 2.2 (ref 1.2–4.9)
T3 Uptake Ratio: 23 % — ABNORMAL LOW (ref 24–39)
T4, Total: 9.4 ug/dL (ref 4.5–12.0)
TSH: 0.636 u[IU]/mL (ref 0.450–4.500)

## 2023-07-11 LAB — KAPPA/LAMBDA LIGHT CHAINS
Kappa free light chain: 47.6 mg/L — ABNORMAL HIGH (ref 3.3–19.4)
Kappa, lambda light chain ratio: 1.78 — ABNORMAL HIGH (ref 0.26–1.65)
Lambda free light chains: 26.7 mg/L — ABNORMAL HIGH (ref 5.7–26.3)

## 2023-07-12 ENCOUNTER — Encounter: Payer: Self-pay | Admitting: Medical Oncology

## 2023-07-12 ENCOUNTER — Inpatient Hospital Stay (HOSPITAL_BASED_OUTPATIENT_CLINIC_OR_DEPARTMENT_OTHER): Payer: No Typology Code available for payment source | Admitting: Medical Oncology

## 2023-07-12 ENCOUNTER — Inpatient Hospital Stay: Payer: No Typology Code available for payment source

## 2023-07-12 VITALS — BP 133/83 | HR 67 | Temp 96.2°F | Wt 195.0 lb

## 2023-07-12 DIAGNOSIS — D472 Monoclonal gammopathy: Secondary | ICD-10-CM | POA: Diagnosis not present

## 2023-07-12 DIAGNOSIS — J392 Other diseases of pharynx: Secondary | ICD-10-CM

## 2023-07-12 DIAGNOSIS — N1832 Chronic kidney disease, stage 3b: Secondary | ICD-10-CM

## 2023-07-12 DIAGNOSIS — D508 Other iron deficiency anemias: Secondary | ICD-10-CM | POA: Diagnosis not present

## 2023-07-12 DIAGNOSIS — C73 Malignant neoplasm of thyroid gland: Secondary | ICD-10-CM | POA: Diagnosis not present

## 2023-07-12 NOTE — Progress Notes (Signed)
Sara Fields  Patient Care Team: Sara Ferrier, Sara Fields as PCP - General (Internal Medicine) Sara Coder, Sara Fields as Medical Oncologist (Hematology and Oncology)   Fields Staging  No matching staging information was found for the patient.    Oncology History Overview Fields  1999- right thyroid lobectomy by Dr. Maurice March with unusual poorly differentiated Fields, no additional treatment  2015- recurrent mass in thyroid bed. PET-CT showed FDG avid lesion in thyroid bed + nasopharyx.  2015- Dr. 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 Margins not noted [facility], [place], #[id number]).   01/05/2014- Nasopharyngeal biopsy negative for malignancy  04/25/14- completed postoperative XRT completing 65.25 CGy in 29 fractions  05/2014 + 07/2014- PET avidity at the left nasopharynx and left oropharyngeal soft tissue as well as a hypermetabolic 1.5 cm mass in the right thyroid bed. Dr Betti Cruz excised the paratracheal mass on 09/02/2014. Inspect of the nasopharynx at the time of surgery revealed no obvious abnormality and no biopsy was completed.   09/2014- NP avid area biopsied and negative for carcinoma. SHe underwent a thyroid FNA and that was negative for Fields.  # Chronic Anemia- IDA [ EGD- hiatal hernia; Jan 2017- Dr.Rein] AUG 2017- IV Venofer  # AUG 2017- Thyroglobulin 40/Sep 2017 US neck- NED  # Hoarseness of voice- sec to RT [Dr.Bennett; Dec 2017]  # AUG 2017- IgG kappa 0.4gm//N-kappa-Lamda light chains     Malignant neoplasm of thyroid gland (HCC)  09/01/2014 Initial Diagnosis   Malignant neoplasm of thyroid gland (HCC)     INTERVAL HISTORY: Alone.  Ambulating with a cane.   Sara Fields 82 y.o.  female pleasant patient above history of thyroid Fields; and MGUS; diagnosis of iron  deficiency anemia unclear etiology is here for follow-up.   Patient denies new problems/concerns today.  Has chronic mild fatigue which is unchanged.   She states that she is blessed to be doing as well as she has been.   She denies any new shortness of breath or cough he denies any worsening lumps or bumps in the neck.  No blood in stools no black or stools.  Wt Readings from Last 3 Encounters:  07/12/23 195 lb (88.5 kg)  12/07/22 190 lb 9.6 oz (86.5 kg)  06/06/22 186 lb (84.4 kg)     Review of Systems  Constitutional:  Positive for malaise/fatigue. Negative for chills, diaphoresis, fever and weight loss.  HENT:  Negative for nosebleeds and sore throat.   Eyes:  Negative for double vision.  Respiratory:  Negative for cough, hemoptysis, sputum production, shortness of breath and wheezing.   Cardiovascular:  Negative for chest pain, palpitations, orthopnea and leg swelling.  Gastrointestinal:  Negative for abdominal pain, blood in stool, constipation, diarrhea, heartburn, melena, nausea and vomiting.  Genitourinary:  Negative for dysuria, frequency and urgency.  Musculoskeletal:  Positive for neck pain. Negative for back pain and joint pain.  Skin: Negative.  Negative for itching and rash.  Neurological:  Negative for dizziness, tingling, focal weakness, weakness and headaches.  Endo/Heme/Allergies:  Does not bruise/bleed easily.  Psychiatric/Behavioral:  Negative for depression. The patient is not nervous/anxious and does not have insomnia.      PAST MEDICAL HISTORY :  Past Medical History:  Diagnosis Date   Anemia    Asthma    COPD (chronic obstructive pulmonary  disease) (HCC)    Dyspnea    GERD (gastroesophageal reflux disease)    Hyperlipidemia    Hypertension    Thyroid Fields (HCC) 1999 and 2015   Partial thyroidectomy with rad tx's.     PAST SURGICAL HISTORY :   Past Surgical History:  Procedure Laterality Date   ABDOMINAL HYSTERECTOMY  1973   partial    COLONOSCOPY WITH PROPOFOL N/A 09/08/2021   Procedure: COLONOSCOPY WITH PROPOFOL;  Surgeon: Earline Mayotte, Sara Fields;  Location: [facility] ENDOSCOPY;  Service: Endoscopy;  Laterality: N/A;   ESOPHAGOGASTRODUODENOSCOPY N/A 02/05/2017   Procedure: ESOPHAGOGASTRODUODENOSCOPY (EGD);  Surgeon: Charlott Rakes, Sara Fields;  Location: [facility] ENDOSCOPY;  Service: Endoscopy;  Laterality: N/A;   ESOPHAGOGASTRODUODENOSCOPY (EGD) WITH PROPOFOL N/A 11/16/2015   Procedure: ESOPHAGOGASTRODUODENOSCOPY (EGD) WITH PROPOFOL;  Surgeon: Elnita Maxwell, Sara Fields;  Location: [facility] ENDOSCOPY;  Service: Endoscopy;  Laterality: N/A;   ESOPHAGOGASTRODUODENOSCOPY (EGD) WITH PROPOFOL N/A 09/08/2021   Procedure: ESOPHAGOGASTRODUODENOSCOPY (EGD) WITH PROPOFOL;  Surgeon: Earline Mayotte, Sara Fields;  Location: [facility] ENDOSCOPY;  Service: Endoscopy;  Laterality: N/A;   THYROID SURGERY  1999 and 2015   Partial Thyroidectomy    FAMILY HISTORY :   Family History  Problem Relation Age of Onset   Lung Fields Father    Hypertension Father    Bone Fields Sister    Hypertension Sister    Lung Fields Brother    Hypertension Brother    Kidney Fields Sister    Hypertension Sister    Lung Fields Sister    Hypertension Sister    Breast Fields Neg Hx     SOCIAL HISTORY:   Social History   Tobacco Use   Smoking status: Never   Smokeless tobacco: Never  Vaping Use   Vaping status: Never Used  Substance Use Topics   Alcohol use: No   Drug use: No    ALLERGIES:  has No Known Allergies.  MEDICATIONS:  Current Outpatient Medications  Medication Sig Dispense Refill   albuterol (PROVENTIL HFA;VENTOLIN HFA) 108 (90 Base) MCG/ACT inhaler Inhale into the lungs every 6 (six) hours as needed for wheezing or shortness of breath.     allopurinol (ZYLOPRIM) 100 MG tablet Take 100 mg by mouth daily.     amLODipine (NORVASC) 5 MG tablet Take 1 tablet by mouth.     ARNUITY ELLIPTA 100 MCG/ACT AEPB Inhale into the lungs.     azelastine (ASTELIN) 0.1 % nasal  spray Place 1 spray into both nostrils daily as needed for rhinitis or allergies.     bumetanide (BUMEX) 1 MG tablet Take 1 mg by mouth daily.     calcium carbonate (OS-CAL) 600 MG TABS tablet Take 600 mg by mouth 2 (two) times daily with a meal.     ferrous sulfate 325 (65 FE) MG tablet Take 325 mg by mouth 2 (two) times daily with a meal.     furosemide (LASIX) 40 MG tablet Take 40 mg by mouth daily.     ipratropium-albuterol (DUONEB) 0.5-2.5 (3) MG/3ML SOLN Inhale 3 mLs into the lungs in the morning, at noon, in the evening, and at bedtime.     levothyroxine (SYNTHROID) 88 MCG tablet TAKE 1 TABLET ONCE DAILY TAKE ON AN EMPTY STOMACH WITH A GLASS OF WATER AT LEAST 30-60 MINUTES BEFORE BREAKFAST     LORazepam (ATIVAN) 0.5 MG tablet Take 0.5 mg by mouth every 8 (eight) hours as needed for anxiety.      montelukast (SINGULAIR) 10 MG tablet Take 10 mg by mouth at  bedtime.     pantoprazole (PROTONIX) 40 MG tablet Take 1 tablet (40 mg total) by mouth 2 (two) times daily before a meal. 60 tablet 1   potassium chloride SA (KLOR-CON) 20 MEQ tablet Take 20 mEq by mouth daily.      predniSONE (DELTASONE) 10 MG tablet 10 mg. Prednisone 10 mg, 4 pills q day x 2 days, 3 pills q day x 2 days, 2 pills q day x 2 days, 1 pill q day x 2 days.     propranolol (INDERAL) 20 MG tablet Take 20 mg by mouth 3 (three) times daily.     traMADol (ULTRAM) 50 MG tablet Take 50 mg by mouth every 6 (six) hours as needed.     meclizine (ANTIVERT) 12.5 MG tablet Take 12.5 mg by mouth 2 (two) times daily as needed for dizziness.  (Patient not taking: Reported on 06/02/2021)     rosuvastatin (CRESTOR) 10 MG tablet Take 10 mg by mouth daily.      No current facility-administered medications for this visit.   Facility-Administered Medications Ordered in Other Visits  Medication Dose Route Frequency Provider Last Rate Last Admin   iron sucrose (VENOFER) 200 mg IVPB  200 mg Intravenous Once Sara Coder, Sara Fields        PHYSICAL  EXAMINATION: ECOG PERFORMANCE STATUS:   BP 133/83 (BP Location: Left Arm, Patient Position: Sitting)   Pulse 67   Temp (!) 96.2 F (35.7 C) (Tympanic)   Wt 195 lb (88.5 kg)   SpO2 100%   BMI 30.54 kg/m   Filed Weights   07/12/23 1457  Weight: 195 lb (88.5 kg)    Physical Exam Constitutional:      Comments: Alone.  Ambulating with a cane.  HENT:     Head: Normocephalic and atraumatic.     Mouth/Throat:     Pharynx: No oropharyngeal exudate.  Eyes:     Pupils: Pupils are equal, round, and reactive to light.  Neck:     Comments: Chronic scarring changes noted in the neck bilaterally.  No swelling noted. Cardiovascular:     Rate and Rhythm: Normal rate and regular rhythm.  Pulmonary:     Effort: Pulmonary effort is normal. No respiratory distress.     Breath sounds: Normal breath sounds. No wheezing.  Abdominal:     General: Bowel sounds are normal. There is no distension.     Palpations: Abdomen is soft. There is no mass.     Tenderness: There is no abdominal tenderness. There is no guarding or rebound.  Musculoskeletal:        General: No tenderness. Normal range of motion.     Cervical back: Normal range of motion and neck supple.  Skin:    General: Skin is warm.  Neurological:     Mental Status: She is alert and oriented to person, place, and time.  Psychiatric:        Mood and Affect: Affect normal.      LABORATORY DATA:  I have reviewed the data as listed    Component Value Date/Time   NA 135 07/10/2023 0956   K 3.6 07/10/2023 0956   CL 102 07/10/2023 0956   CO2 26 07/10/2023 0956   GLUCOSE 106 (H) 07/10/2023 0956   BUN 20 07/10/2023 0956   CREATININE 1.Sara (H) 07/10/2023 0956   CALCIUM 9.1 07/10/2023 0956   PROT 7.8 05/03/2019 1113   ALBUMIN 4.2 05/03/2019 1113   AST 23 05/03/2019 1113   ALT  13 05/03/2019 1113   ALKPHOS 88 05/03/2019 1113   BILITOT 0.7 05/03/2019 1113   GFRNONAA 40 (L) 07/10/2023 0956   GFRAA 58 (L) 05/26/2020 1358    No  results found for: "SPEP", "UPEP"  Lab Results  Component Value Date   WBC 6.0 07/10/2023   NEUTROABS 4.1 07/10/2023   HGB 11.7 (L) 07/10/2023   HCT 36.5 07/10/2023   MCV 97.6 07/10/2023   PLT 323 07/10/2023      Chemistry      Component Value Date/Time   NA 135 07/10/2023 0956   K 3.6 07/10/2023 0956   CL 102 07/10/2023 0956   CO2 26 07/10/2023 0956   BUN 20 07/10/2023 0956   CREATININE 1.Sara (H) 07/10/2023 0956      Component Value Date/Time   CALCIUM 9.1 07/10/2023 0956   ALKPHOS 88 05/03/2019 1113   AST 23 05/03/2019 1113   ALT 13 05/03/2019 1113   BILITOT 0.7 05/03/2019 1113      ASSESSMENT & PLAN:  Malignant neoplasm of thyroid gland (HCC) # Thyroid ca with local reurence s/p surgery [[facility]; EBRT [2015; no RAI].  FEB 2024- Thyroglobulin tumor marker has reduced to 2.8 from 7.7  TSH 0.636 continue Synthroid 75 mcg a day. Due for Neck US.    #Iron deficient anemia-unclear etiology hemoglobin stable at 11.7- Iron saturation is 31%, ferritin 538. Continue p.o. iron.   # MGUS-AUG 2023 IgGKappa-0. 5g/dL; kappa lambda light chain ratio normal. Sept 2024-MM panel-pending;  K/L= 1.78. Continue monitoring.     # Stage III- GFR-40 from 48. Could affect Hgb in the future so I would recommend a follow up in 3 months instead of 6 to ensure stability of labs.    # DISPOSITION: # No infusion today Neck US ordered # follow up 3 months- Sara Fields;-possible venofer; Vickie Epley- 2 weeks PRIOR- cbc/bmp mm panel; thyroglobulin/ Thyroid profile; iron studies,ferritin-[place]  All questions were answered. The patient knows to call the clinic with any problems, questions or concerns.     Rushie Chestnut, PA-C 07/12/2023 3:50 PM

## 2023-07-13 LAB — MULTIPLE MYELOMA PANEL, SERUM
Albumin SerPl Elph-Mcnc: 3.5 g/dL (ref 2.9–4.4)
Albumin/Glob SerPl: 1.1 (ref 0.7–1.7)
Alpha 1: 0.3 g/dL (ref 0.0–0.4)
Alpha2 Glob SerPl Elph-Mcnc: 0.9 g/dL (ref 0.4–1.0)
B-Globulin SerPl Elph-Mcnc: 1 g/dL (ref 0.7–1.3)
Gamma Glob SerPl Elph-Mcnc: 1.4 g/dL (ref 0.4–1.8)
Globulin, Total: 3.5 g/dL (ref 2.2–3.9)
IgA: 219 mg/dL (ref 64–422)
IgG (Immunoglobin G), Serum: 1366 mg/dL (ref 586–1602)
IgM (Immunoglobulin M), Srm: 147 mg/dL (ref 26–217)
M Protein SerPl Elph-Mcnc: 0.9 g/dL — ABNORMAL HIGH
Total Protein ELP: 7 g/dL (ref 6.0–8.5)

## 2023-07-20 DIAGNOSIS — D649 Anemia, unspecified: Secondary | ICD-10-CM | POA: Diagnosis not present

## 2023-07-20 DIAGNOSIS — E039 Hypothyroidism, unspecified: Secondary | ICD-10-CM | POA: Diagnosis not present

## 2023-07-20 DIAGNOSIS — Z79891 Long term (current) use of opiate analgesic: Secondary | ICD-10-CM | POA: Diagnosis not present

## 2023-07-20 DIAGNOSIS — N1831 Chronic kidney disease, stage 3a: Secondary | ICD-10-CM | POA: Diagnosis not present

## 2023-07-20 DIAGNOSIS — I10 Essential (primary) hypertension: Secondary | ICD-10-CM | POA: Diagnosis not present

## 2023-07-20 DIAGNOSIS — E785 Hyperlipidemia, unspecified: Secondary | ICD-10-CM | POA: Diagnosis not present

## 2023-07-26 ENCOUNTER — Ambulatory Visit: Payer: No Typology Code available for payment source

## 2023-07-27 DIAGNOSIS — J849 Interstitial pulmonary disease, unspecified: Secondary | ICD-10-CM | POA: Diagnosis not present

## 2023-07-27 DIAGNOSIS — J4520 Mild intermittent asthma, uncomplicated: Secondary | ICD-10-CM | POA: Diagnosis not present

## 2023-07-27 DIAGNOSIS — J439 Emphysema, unspecified: Secondary | ICD-10-CM | POA: Diagnosis not present

## 2023-07-27 DIAGNOSIS — E538 Deficiency of other specified B group vitamins: Secondary | ICD-10-CM | POA: Diagnosis not present

## 2023-07-27 DIAGNOSIS — M109 Gout, unspecified: Secondary | ICD-10-CM | POA: Diagnosis not present

## 2023-07-27 DIAGNOSIS — D472 Monoclonal gammopathy: Secondary | ICD-10-CM | POA: Diagnosis not present

## 2023-07-27 DIAGNOSIS — J8410 Pulmonary fibrosis, unspecified: Secondary | ICD-10-CM | POA: Diagnosis not present

## 2023-07-27 DIAGNOSIS — N1831 Chronic kidney disease, stage 3a: Secondary | ICD-10-CM | POA: Diagnosis not present

## 2023-07-27 DIAGNOSIS — I10 Essential (primary) hypertension: Secondary | ICD-10-CM | POA: Diagnosis not present

## 2023-07-27 DIAGNOSIS — J4530 Mild persistent asthma, uncomplicated: Secondary | ICD-10-CM | POA: Diagnosis not present

## 2023-07-27 DIAGNOSIS — C73 Malignant neoplasm of thyroid gland: Secondary | ICD-10-CM | POA: Diagnosis not present

## 2023-07-27 DIAGNOSIS — M349 Systemic sclerosis, unspecified: Secondary | ICD-10-CM | POA: Diagnosis not present

## 2023-07-27 DIAGNOSIS — R0602 Shortness of breath: Secondary | ICD-10-CM | POA: Diagnosis not present

## 2023-07-27 DIAGNOSIS — N2581 Secondary hyperparathyroidism of renal origin: Secondary | ICD-10-CM | POA: Diagnosis not present

## 2023-07-27 DIAGNOSIS — K219 Gastro-esophageal reflux disease without esophagitis: Secondary | ICD-10-CM | POA: Diagnosis not present

## 2023-07-27 DIAGNOSIS — Z23 Encounter for immunization: Secondary | ICD-10-CM | POA: Diagnosis not present

## 2023-07-28 ENCOUNTER — Other Ambulatory Visit: Payer: Self-pay | Admitting: Specialist

## 2023-07-28 DIAGNOSIS — J849 Interstitial pulmonary disease, unspecified: Secondary | ICD-10-CM

## 2023-08-01 ENCOUNTER — Ambulatory Visit
Admission: RE | Admit: 2023-08-01 | Discharge: 2023-08-01 | Disposition: A | Discharge status: Home / Self Care | Payer: No Typology Code available for payment source | Source: Ambulatory Visit | Attending: Medical Oncology | Admitting: Medical Oncology

## 2023-08-01 DIAGNOSIS — C73 Malignant neoplasm of thyroid gland: Secondary | ICD-10-CM | POA: Diagnosis not present

## 2023-08-01 DIAGNOSIS — J392 Other diseases of pharynx: Secondary | ICD-10-CM | POA: Insufficient documentation

## 2023-08-01 DIAGNOSIS — Z85850 Personal history of malignant neoplasm of thyroid: Secondary | ICD-10-CM | POA: Diagnosis not present

## 2023-08-08 ENCOUNTER — Ambulatory Visit
Admission: RE | Admit: 2023-08-08 | Discharge: 2023-08-08 | Disposition: A | Discharge status: Home / Self Care | Payer: No Typology Code available for payment source | Source: Ambulatory Visit | Attending: Specialist | Admitting: Specialist

## 2023-08-08 ENCOUNTER — Other Ambulatory Visit: Payer: No Typology Code available for payment source

## 2023-08-08 DIAGNOSIS — J849 Interstitial pulmonary disease, unspecified: Secondary | ICD-10-CM | POA: Insufficient documentation

## 2023-08-08 DIAGNOSIS — J8410 Pulmonary fibrosis, unspecified: Secondary | ICD-10-CM | POA: Diagnosis not present

## 2023-08-08 DIAGNOSIS — R918 Other nonspecific abnormal finding of lung field: Secondary | ICD-10-CM | POA: Diagnosis not present

## 2023-08-14 DIAGNOSIS — E890 Postprocedural hypothyroidism: Secondary | ICD-10-CM | POA: Diagnosis not present

## 2023-08-14 DIAGNOSIS — N2581 Secondary hyperparathyroidism of renal origin: Secondary | ICD-10-CM | POA: Diagnosis not present

## 2023-08-14 DIAGNOSIS — E785 Hyperlipidemia, unspecified: Secondary | ICD-10-CM | POA: Diagnosis not present

## 2023-08-14 DIAGNOSIS — I700 Atherosclerosis of aorta: Secondary | ICD-10-CM | POA: Diagnosis not present

## 2023-08-14 DIAGNOSIS — J8410 Pulmonary fibrosis, unspecified: Secondary | ICD-10-CM | POA: Diagnosis not present

## 2023-08-14 DIAGNOSIS — Z6831 Body mass index (BMI) 31.0-31.9, adult: Secondary | ICD-10-CM | POA: Diagnosis not present

## 2023-08-14 DIAGNOSIS — E669 Obesity, unspecified: Secondary | ICD-10-CM | POA: Diagnosis not present

## 2023-08-14 DIAGNOSIS — R2681 Unsteadiness on feet: Secondary | ICD-10-CM | POA: Diagnosis not present

## 2023-08-14 DIAGNOSIS — Z008 Encounter for other general examination: Secondary | ICD-10-CM | POA: Diagnosis not present

## 2023-10-11 ENCOUNTER — Ambulatory Visit: Payer: No Typology Code available for payment source

## 2023-10-11 ENCOUNTER — Ambulatory Visit: Payer: No Typology Code available for payment source | Admitting: Internal Medicine

## 2023-10-11 ENCOUNTER — Other Ambulatory Visit: Payer: No Typology Code available for payment source

## 2023-11-30 DIAGNOSIS — J4520 Mild intermittent asthma, uncomplicated: Secondary | ICD-10-CM | POA: Diagnosis not present

## 2023-11-30 DIAGNOSIS — J84112 Idiopathic pulmonary fibrosis: Secondary | ICD-10-CM | POA: Diagnosis not present

## 2023-11-30 DIAGNOSIS — K219 Gastro-esophageal reflux disease without esophagitis: Secondary | ICD-10-CM | POA: Diagnosis not present

## 2024-01-23 DIAGNOSIS — Z79891 Long term (current) use of opiate analgesic: Secondary | ICD-10-CM | POA: Diagnosis not present

## 2024-01-23 DIAGNOSIS — I10 Essential (primary) hypertension: Secondary | ICD-10-CM | POA: Diagnosis not present

## 2024-01-23 DIAGNOSIS — M109 Gout, unspecified: Secondary | ICD-10-CM | POA: Diagnosis not present

## 2024-01-23 DIAGNOSIS — E538 Deficiency of other specified B group vitamins: Secondary | ICD-10-CM | POA: Diagnosis not present

## 2024-01-23 DIAGNOSIS — N1831 Chronic kidney disease, stage 3a: Secondary | ICD-10-CM | POA: Diagnosis not present

## 2024-01-23 DIAGNOSIS — E785 Hyperlipidemia, unspecified: Secondary | ICD-10-CM | POA: Diagnosis not present

## 2024-01-23 DIAGNOSIS — D472 Monoclonal gammopathy: Secondary | ICD-10-CM | POA: Diagnosis not present

## 2024-01-23 DIAGNOSIS — N2581 Secondary hyperparathyroidism of renal origin: Secondary | ICD-10-CM | POA: Diagnosis not present

## 2024-02-14 DIAGNOSIS — D649 Anemia, unspecified: Secondary | ICD-10-CM | POA: Diagnosis not present

## 2024-02-14 DIAGNOSIS — J4520 Mild intermittent asthma, uncomplicated: Secondary | ICD-10-CM | POA: Diagnosis not present

## 2024-02-14 DIAGNOSIS — J8410 Pulmonary fibrosis, unspecified: Secondary | ICD-10-CM | POA: Diagnosis not present

## 2024-02-14 DIAGNOSIS — N2581 Secondary hyperparathyroidism of renal origin: Secondary | ICD-10-CM | POA: Diagnosis not present

## 2024-02-14 DIAGNOSIS — N1831 Chronic kidney disease, stage 3a: Secondary | ICD-10-CM | POA: Diagnosis not present

## 2024-02-14 DIAGNOSIS — J439 Emphysema, unspecified: Secondary | ICD-10-CM | POA: Diagnosis not present

## 2024-02-14 DIAGNOSIS — C73 Malignant neoplasm of thyroid gland: Secondary | ICD-10-CM | POA: Diagnosis not present

## 2024-02-14 DIAGNOSIS — E538 Deficiency of other specified B group vitamins: Secondary | ICD-10-CM | POA: Diagnosis not present

## 2024-02-14 DIAGNOSIS — D472 Monoclonal gammopathy: Secondary | ICD-10-CM | POA: Diagnosis not present

## 2024-02-14 DIAGNOSIS — Z0000 Encounter for general adult medical examination without abnormal findings: Secondary | ICD-10-CM | POA: Diagnosis not present

## 2024-02-14 DIAGNOSIS — K219 Gastro-esophageal reflux disease without esophagitis: Secondary | ICD-10-CM | POA: Diagnosis not present

## 2024-02-14 DIAGNOSIS — M349 Systemic sclerosis, unspecified: Secondary | ICD-10-CM | POA: Diagnosis not present

## 2024-02-21 DIAGNOSIS — M545 Low back pain, unspecified: Secondary | ICD-10-CM | POA: Diagnosis not present

## 2024-02-21 DIAGNOSIS — I129 Hypertensive chronic kidney disease with stage 1 through stage 4 chronic kidney disease, or unspecified chronic kidney disease: Secondary | ICD-10-CM | POA: Diagnosis not present

## 2024-02-21 DIAGNOSIS — Z8521 Personal history of malignant neoplasm of larynx: Secondary | ICD-10-CM | POA: Diagnosis not present

## 2024-02-21 DIAGNOSIS — F411 Generalized anxiety disorder: Secondary | ICD-10-CM | POA: Diagnosis not present

## 2024-02-21 DIAGNOSIS — R2681 Unsteadiness on feet: Secondary | ICD-10-CM | POA: Diagnosis not present

## 2024-02-21 DIAGNOSIS — E890 Postprocedural hypothyroidism: Secondary | ICD-10-CM | POA: Diagnosis not present

## 2024-02-21 DIAGNOSIS — K862 Cyst of pancreas: Secondary | ICD-10-CM | POA: Diagnosis not present

## 2024-02-21 DIAGNOSIS — Z008 Encounter for other general examination: Secondary | ICD-10-CM | POA: Diagnosis not present

## 2024-02-21 DIAGNOSIS — N1832 Chronic kidney disease, stage 3b: Secondary | ICD-10-CM | POA: Diagnosis not present

## 2024-02-21 DIAGNOSIS — E785 Hyperlipidemia, unspecified: Secondary | ICD-10-CM | POA: Diagnosis not present

## 2024-02-21 DIAGNOSIS — M349 Systemic sclerosis, unspecified: Secondary | ICD-10-CM | POA: Diagnosis not present

## 2024-02-27 DIAGNOSIS — I129 Hypertensive chronic kidney disease with stage 1 through stage 4 chronic kidney disease, or unspecified chronic kidney disease: Secondary | ICD-10-CM | POA: Diagnosis not present

## 2024-02-27 DIAGNOSIS — R2681 Unsteadiness on feet: Secondary | ICD-10-CM | POA: Diagnosis not present

## 2024-02-27 DIAGNOSIS — N1832 Chronic kidney disease, stage 3b: Secondary | ICD-10-CM | POA: Diagnosis not present

## 2024-02-27 DIAGNOSIS — C73 Malignant neoplasm of thyroid gland: Secondary | ICD-10-CM | POA: Diagnosis not present

## 2024-02-27 DIAGNOSIS — M545 Low back pain, unspecified: Secondary | ICD-10-CM | POA: Diagnosis not present

## 2024-03-12 DIAGNOSIS — Z008 Encounter for other general examination: Secondary | ICD-10-CM | POA: Diagnosis not present

## 2024-03-12 DIAGNOSIS — Z515 Encounter for palliative care: Secondary | ICD-10-CM | POA: Diagnosis not present

## 2024-03-12 DIAGNOSIS — J449 Chronic obstructive pulmonary disease, unspecified: Secondary | ICD-10-CM | POA: Diagnosis not present

## 2024-04-04 DIAGNOSIS — J8489 Other specified interstitial pulmonary diseases: Secondary | ICD-10-CM | POA: Diagnosis not present

## 2024-04-04 DIAGNOSIS — J849 Interstitial pulmonary disease, unspecified: Secondary | ICD-10-CM | POA: Diagnosis not present

## 2024-04-09 DIAGNOSIS — D472 Monoclonal gammopathy: Secondary | ICD-10-CM | POA: Diagnosis not present

## 2024-04-09 DIAGNOSIS — J8410 Pulmonary fibrosis, unspecified: Secondary | ICD-10-CM | POA: Diagnosis not present

## 2024-04-09 DIAGNOSIS — J4520 Mild intermittent asthma, uncomplicated: Secondary | ICD-10-CM | POA: Diagnosis not present

## 2024-04-09 DIAGNOSIS — C73 Malignant neoplasm of thyroid gland: Secondary | ICD-10-CM | POA: Diagnosis not present

## 2024-04-09 DIAGNOSIS — N2581 Secondary hyperparathyroidism of renal origin: Secondary | ICD-10-CM | POA: Diagnosis not present

## 2024-04-09 DIAGNOSIS — J439 Emphysema, unspecified: Secondary | ICD-10-CM | POA: Diagnosis not present

## 2024-04-09 DIAGNOSIS — I10 Essential (primary) hypertension: Secondary | ICD-10-CM | POA: Diagnosis not present

## 2024-04-09 DIAGNOSIS — N1831 Chronic kidney disease, stage 3a: Secondary | ICD-10-CM | POA: Diagnosis not present

## 2024-04-09 DIAGNOSIS — R1319 Other dysphagia: Secondary | ICD-10-CM | POA: Diagnosis not present

## 2024-04-09 DIAGNOSIS — J849 Interstitial pulmonary disease, unspecified: Secondary | ICD-10-CM | POA: Diagnosis not present

## 2024-04-09 DIAGNOSIS — M349 Systemic sclerosis, unspecified: Secondary | ICD-10-CM | POA: Diagnosis not present

## 2024-04-09 DIAGNOSIS — K219 Gastro-esophageal reflux disease without esophagitis: Secondary | ICD-10-CM | POA: Diagnosis not present

## 2024-04-12 DIAGNOSIS — K862 Cyst of pancreas: Secondary | ICD-10-CM | POA: Diagnosis not present

## 2024-04-12 DIAGNOSIS — R1319 Other dysphagia: Secondary | ICD-10-CM | POA: Diagnosis not present

## 2024-04-17 DIAGNOSIS — Z008 Encounter for other general examination: Secondary | ICD-10-CM | POA: Diagnosis not present

## 2024-04-17 DIAGNOSIS — Z8589 Personal history of malignant neoplasm of other organs and systems: Secondary | ICD-10-CM | POA: Diagnosis not present

## 2024-04-17 DIAGNOSIS — R1310 Dysphagia, unspecified: Secondary | ICD-10-CM | POA: Diagnosis not present

## 2024-04-17 DIAGNOSIS — Z85850 Personal history of malignant neoplasm of thyroid: Secondary | ICD-10-CM | POA: Diagnosis not present

## 2024-04-22 ENCOUNTER — Ambulatory Visit: Admitting: Anesthesiology

## 2024-04-22 ENCOUNTER — Encounter
Admission: RE | Disposition: A | Discharge status: Home / Self Care | Payer: Self-pay | Source: Ambulatory Visit | Attending: Gastroenterology

## 2024-04-22 ENCOUNTER — Ambulatory Visit
Admission: RE | Admit: 2024-04-22 | Discharge: 2024-04-22 | Disposition: A | Discharge status: Home / Self Care | Source: Ambulatory Visit | Attending: Gastroenterology | Admitting: Gastroenterology

## 2024-04-22 ENCOUNTER — Other Ambulatory Visit: Payer: Self-pay

## 2024-04-22 DIAGNOSIS — Z79890 Hormone replacement therapy (postmenopausal): Secondary | ICD-10-CM | POA: Diagnosis not present

## 2024-04-22 DIAGNOSIS — J4489 Other specified chronic obstructive pulmonary disease: Secondary | ICD-10-CM | POA: Insufficient documentation

## 2024-04-22 DIAGNOSIS — R1310 Dysphagia, unspecified: Secondary | ICD-10-CM | POA: Diagnosis not present

## 2024-04-22 DIAGNOSIS — K2970 Gastritis, unspecified, without bleeding: Secondary | ICD-10-CM | POA: Insufficient documentation

## 2024-04-22 DIAGNOSIS — K449 Diaphragmatic hernia without obstruction or gangrene: Secondary | ICD-10-CM | POA: Insufficient documentation

## 2024-04-22 DIAGNOSIS — I129 Hypertensive chronic kidney disease with stage 1 through stage 4 chronic kidney disease, or unspecified chronic kidney disease: Secondary | ICD-10-CM | POA: Diagnosis not present

## 2024-04-22 DIAGNOSIS — I10 Essential (primary) hypertension: Secondary | ICD-10-CM | POA: Diagnosis not present

## 2024-04-22 DIAGNOSIS — J8410 Pulmonary fibrosis, unspecified: Secondary | ICD-10-CM | POA: Insufficient documentation

## 2024-04-22 DIAGNOSIS — N183 Chronic kidney disease, stage 3 unspecified: Secondary | ICD-10-CM | POA: Insufficient documentation

## 2024-04-22 DIAGNOSIS — K2289 Other specified disease of esophagus: Secondary | ICD-10-CM | POA: Diagnosis not present

## 2024-04-22 DIAGNOSIS — E039 Hypothyroidism, unspecified: Secondary | ICD-10-CM | POA: Insufficient documentation

## 2024-04-22 DIAGNOSIS — Z85850 Personal history of malignant neoplasm of thyroid: Secondary | ICD-10-CM | POA: Insufficient documentation

## 2024-04-22 DIAGNOSIS — R1319 Other dysphagia: Secondary | ICD-10-CM | POA: Diagnosis not present

## 2024-04-22 HISTORY — PX: ESOPHAGOGASTRODUODENOSCOPY: SHX5428

## 2024-04-22 LAB — KOH PREP: KOH Prep: NONE SEEN

## 2024-04-22 SURGERY — EGD (ESOPHAGOGASTRODUODENOSCOPY)
Anesthesia: General

## 2024-04-22 MED ORDER — LIDOCAINE HCL (PF) 2 % IJ SOLN
INTRAMUSCULAR | Status: AC
  Filled 2024-04-22: qty 5

## 2024-04-22 MED ORDER — GLYCOPYRROLATE 0.2 MG/ML IJ SOLN
INTRAMUSCULAR | Status: DC | PRN
  Administered 2024-04-22: .2 mg via INTRAVENOUS

## 2024-04-22 MED ORDER — SODIUM CHLORIDE 0.9 % IV SOLN: INTRAVENOUS | Status: DC

## 2024-04-22 MED ORDER — PROPOFOL 10 MG/ML IV BOLUS
INTRAVENOUS | Status: DC | PRN
  Administered 2024-04-22 (×2): 50 mg via INTRAVENOUS

## 2024-04-22 MED ORDER — LIDOCAINE HCL (CARDIAC) PF 100 MG/5ML IV SOSY
PREFILLED_SYRINGE | INTRAVENOUS | Status: DC | PRN
  Administered 2024-04-22: 60 mg via INTRAVENOUS

## 2024-04-22 MED ORDER — GLYCOPYRROLATE 0.2 MG/ML IJ SOLN
INTRAMUSCULAR | Status: AC
  Filled 2024-04-22: qty 2

## 2024-04-22 MED ORDER — PROPOFOL 500 MG/50ML IV EMUL
INTRAVENOUS | Status: DC | PRN
  Administered 2024-04-22: 50 ug/kg/min via INTRAVENOUS

## 2024-04-22 NOTE — H&P (Signed)
 Outpatient short stay form Pre-procedure 04/22/2024  Sara ONEIDA Schick, MD  Primary Physician: Fernande Ophelia JINNY DOUGLAS, MD  Reason for visit:  Dysphagia  History of present illness:    83 y/o lady with hypertension, asthma, and hypothyroidism here for EGD for dysphagia mostly to solids, occasionally liquids. She has a history of a large hiatal hernia. No blood thinners. History of radiation for thyroid  cancer. No family history of GI malignancies.    Current Facility-Administered Medications:    0.9 %  sodium chloride  infusion, , Intravenous, Continuous, Angelus Hoopes, Sara ONEIDA, MD, Last Rate: 20 mL/hr at 04/22/24 1000, Continued from Pre-op at 04/22/24 1000  Facility-Administered Medications Ordered in Other Encounters:    iron  sucrose (VENOFER ) 200 mg IVPB, 200 mg, Intravenous, Once, Brahmanday, Govinda R, MD  Medications Prior to Admission  Medication Sig Dispense Refill Last Dose/Taking   amLODipine (NORVASC) 5 MG tablet Take 1 tablet by mouth.   04/22/2024 Morning   ARNUITY ELLIPTA 100 MCG/ACT AEPB Inhale into the lungs.   04/22/2024 Morning   bumetanide (BUMEX) 1 MG tablet Take 1 mg by mouth daily.   04/21/2024   calcium  carbonate (OS-CAL) 600 MG TABS tablet Take 600 mg by mouth 2 (two) times daily with a meal.   04/21/2024   ferrous sulfate 325 (65 FE) MG tablet Take 325 mg by mouth 2 (two) times daily with a meal.   04/21/2024   furosemide (LASIX) 40 MG tablet Take 40 mg by mouth daily.   04/21/2024   ipratropium-albuterol  (DUONEB) 0.5-2.5 (3) MG/3ML SOLN Inhale 3 mLs into the lungs in the morning, at noon, in the evening, and at bedtime.   04/21/2024   levothyroxine  (SYNTHROID ) 88 MCG tablet TAKE 1 TABLET ONCE DAILY TAKE ON AN EMPTY STOMACH WITH A GLASS OF WATER AT LEAST 30-60 MINUTES BEFORE BREAKFAST   04/21/2024   LORazepam (ATIVAN) 0.5 MG tablet Take 0.5 mg by mouth every 8 (eight) hours as needed for anxiety.    04/21/2024   meclizine (ANTIVERT) 12.5 MG tablet Take 12.5 mg by mouth 2 (two)  times daily as needed for dizziness.   04/21/2024   pantoprazole  (PROTONIX ) 40 MG tablet Take 1 tablet (40 mg total) by mouth 2 (two) times daily before a meal. 60 tablet 1 04/22/2024 Morning   propranolol (INDERAL) 20 MG tablet Take 20 mg by mouth 3 (three) times daily.   04/21/2024   traMADol (ULTRAM) 50 MG tablet Take 50 mg by mouth every 6 (six) hours as needed.   04/21/2024   albuterol  (PROVENTIL  HFA;VENTOLIN  HFA) 108 (90 Base) MCG/ACT inhaler Inhale into the lungs every 6 (six) hours as needed for wheezing or shortness of breath.      allopurinol  (ZYLOPRIM ) 100 MG tablet Take 100 mg by mouth daily.      azelastine (ASTELIN) 0.1 % nasal spray Place 1 spray into both nostrils daily as needed for rhinitis or allergies.      montelukast (SINGULAIR) 10 MG tablet Take 10 mg by mouth at bedtime.      potassium chloride SA (KLOR-CON) 20 MEQ tablet Take 20 mEq by mouth daily.       predniSONE (DELTASONE) 10 MG tablet 10 mg. Prednisone 10 mg, 4 pills q day x 2 days, 3 pills q day x 2 days, 2 pills q day x 2 days, 1 pill q day x 2 days. (Patient not taking: Reported on 04/22/2024)   Completed Course   rosuvastatin  (CRESTOR ) 10 MG tablet Take 10 mg by mouth daily.  No Known Allergies   Past Medical History:  Diagnosis Date   Anemia    Asthma    COPD (chronic obstructive pulmonary disease) (HCC)    Dyspnea    GERD (gastroesophageal reflux disease)    Hyperlipidemia    Hypertension    Thyroid  cancer (HCC) 1999 and 2015   Partial thyroidectomy with rad tx's.     Review of systems:  Otherwise negative.    Physical Exam  Gen: Alert, oriented. Appears stated age.  HEENT: PERRLA. Lungs: No respiratory distress CV: RRR Abd: soft, benign, no masses Ext: No edema    Planned procedures: Proceed with EGD. The patient understands the nature of the planned procedure, indications, risks, alternatives and potential complications including but not limited to bleeding, infection, perforation,  damage to internal organs and possible oversedation/side effects from anesthesia. The patient agrees and gives consent to proceed.  Please refer to procedure notes for findings, recommendations and patient disposition/instructions.     Sara ONEIDA Schick, MD [facility]

## 2024-04-22 NOTE — Op Note (Signed)
 [facility] Patient Name: Sara Fields Procedure Date: 04/22/2024 10:02 AM MRN: [id number] Account #: [id number] Date of Birth: 27-Oct-1940 Admit Type: Outpatient Age: 83 Room: [facility] ENDO ROOM 3 Gender: Female Note Status: Finalized Instrument Name: Barnie Endoscope [id number] Procedure:             Upper GI endoscopy Indications:           Dysphagia Providers:             Ole Schick MD, MD Referring MD:          Ophelia Sage, MD (Referring MD) Medicines:             Monitored Anesthesia Care Complications:         No immediate complications. Procedure:             Pre-Anesthesia Assessment:                        - Prior to the procedure, a History and Physical was                         performed, and patient medications and allergies were                         reviewed. The patient is competent. The risks and                         benefits of the procedure and the sedation options and                         risks were discussed with the patient. All questions                         were answered and informed consent was obtained.                         Patient identification and proposed procedure were                         verified by the physician, the nurse, the                         anesthesiologist, the anesthetist and the technician                         in the endoscopy suite. Mental Status Examination:                         alert and oriented. Airway Examination: normal                         oropharyngeal airway and neck mobility. Respiratory                         Examination: clear to auscultation. CV Examination:                         normal. Prophylactic Antibiotics: The patient does not  require prophylactic antibiotics. Prior                         Anticoagulants: The patient has taken no anticoagulant                         or antiplatelet agents. ASA Grade Assessment: III - A                          patient with severe systemic disease. After reviewing                         the risks and benefits, the patient was deemed in                         satisfactory condition to undergo the procedure. The                         anesthesia plan was to use monitored anesthesia care                         (MAC). Immediately prior to administration of                         medications, the patient was re-assessed for adequacy                         to receive sedatives. The heart rate, respiratory                         rate, oxygen saturations, blood pressure, adequacy of                         pulmonary ventilation, and response to care were                         monitored throughout the procedure. The physical                         status of the patient was re-assessed after the                         procedure.                        After obtaining informed consent, the endoscope was                         passed under direct vision. Throughout the procedure,                         the patient's blood pressure, pulse, and oxygen                         saturations were monitored continuously. The Endoscope                         was introduced through the mouth, and advanced to the  second part of duodenum. The upper GI endoscopy was                         somewhat difficult due to unusual anatomy. The patient                         tolerated the procedure well. Findings:      A large paraesophageal hernia was found likely type IV given most of the       stomach is in the chest.      White nummular lesions were noted in the lower third of the esophagus.       Brushings for KOH prep were obtained in the lower third of the       esophagus. Estimated blood loss: none.      Localized mild inflammation characterized by erythema was found in the       stomach.      The examined duodenum was normal. Impression:            - Large  paraesophageal hernia.                        - White nummular lesions in esophageal mucosa.                         Brushings performed.                        - Gastritis.                        - Normal examined duodenum. Recommendation:        - Discharge patient to home.                        - Resume previous diet.                        - Continue present medications.                        - Suspect dysphagia is secondary to large hernia. Will                         need to discuss surgical referral. Procedure Code(s):     --- Professional ---                        [phone], Esophagogastroduodenoscopy, flexible,                         transoral; diagnostic, including collection of                         specimen(s) by brushing or washing, when performed                         (separate procedure) Diagnosis Code(s):     --- Professional ---                        K44.9, Diaphragmatic hernia without obstruction or  gangrene                        K22.89, Other specified disease of esophagus                        K29.70, Gastritis, unspecified, without bleeding                        R13.10, Dysphagia, unspecified CPT copyright 2022 American Medical Association. All rights reserved. The codes documented in this report are preliminary and upon coder review may  be revised to meet current compliance requirements. Ole Schick MD, MD 04/22/2024 10:45:09 AM Number of Addenda: 0 Note Initiated On: 04/22/2024 10:02 AM Estimated Blood Loss:  Estimated blood loss: none.      [facility]

## 2024-04-22 NOTE — Anesthesia Preprocedure Evaluation (Addendum)
 Anesthesia Evaluation  Patient identified by MRN, date of birth, ID band Patient awake    Reviewed: Allergy & Precautions, NPO status , Patient's Chart, lab work & pertinent test results  History of Anesthesia Complications Negative for: history of anesthetic complications  Airway Mallampati: I   Neck ROM: Full    Dental  (+) Upper Dentures, Lower Dentures   Pulmonary asthma , COPD Pulmonary fibrosis   Pulmonary exam normal breath sounds clear to auscultation       Cardiovascular hypertension, Normal cardiovascular exam Rhythm:Regular Rate:Normal     Neuro/Psych negative neurological ROS     GI/Hepatic ,GERD  ,,  Endo/Other  Hypothyroidism (thyroid  CA s/p resection)  Obesity   Renal/GU Renal disease (stage III CKD)     Musculoskeletal   Abdominal   Peds  Hematology  (+) Blood dyscrasia, anemia   Anesthesia Other Findings   Reproductive/Obstetrics                             Anesthesia Physical Anesthesia Plan  ASA: 3  Anesthesia Plan: General   Post-op Pain Management:    Induction: Intravenous  PONV Risk Score and Plan: 3 and Propofol  infusion, TIVA and Treatment may vary due to age or medical condition  Airway Management Planned: Natural Airway  Additional Equipment:   Intra-op Plan:   Post-operative Plan:   Informed Consent: I have reviewed the patients History and Physical, chart, labs and discussed the procedure including the risks, benefits and alternatives for the proposed anesthesia with the patient or authorized representative who has indicated his/her understanding and acceptance.       Plan Discussed with: CRNA  Anesthesia Plan Comments: (LMA/GETA backup discussed.  Patient consented for risks of anesthesia including but not limited to:  - adverse reactions to medications - damage to eyes, teeth, lips or other oral mucosa - nerve damage due to positioning   - sore throat or hoarseness - damage to heart, brain, nerves, lungs, other parts of body or loss of life  Informed patient about role of CRNA in peri- and intra-operative care.  Patient voiced understanding.)        Anesthesia Quick Evaluation

## 2024-04-22 NOTE — OR Nursing (Signed)
 KOH Esophageal brushing sent to lab

## 2024-04-22 NOTE — Transfer of Care (Signed)
 Immediate Anesthesia Transfer of Care Note  Patient: Sara Fields  Procedure(s) Performed: EGD (ESOPHAGOGASTRODUODENOSCOPY)  Patient Location: PACU  Anesthesia Type:General  Level of Consciousness: sedated  Airway & Oxygen Therapy: Patient Spontanous Breathing  Post-op Assessment: Report given to RN and Post -op Vital signs reviewed and stable  Post vital signs: Reviewed and stable  Last Vitals:  Vitals Value Taken Time  BP 100/55 04/22/24 10:44  Temp    Pulse 76 04/22/24 10:45  Resp 16 04/22/24 10:45  SpO2 97 % 04/22/24 10:45  Vitals shown include unfiled device data.  Last Pain:  Vitals:   04/22/24 0948  TempSrc: Temporal  PainSc: 0-No pain         Complications: No notable events documented.

## 2024-04-22 NOTE — Interval H&P Note (Signed)
 History and Physical Interval Note:  04/22/2024 10:17 AM  Sara Fields  has presented today for surgery, with the diagnosis of esophageal dysphagia.  The various methods of treatment have been discussed with the patient and family. After consideration of risks, benefits and other options for treatment, the patient has consented to  Procedure(s): EGD (ESOPHAGOGASTRODUODENOSCOPY) (N/A) as a surgical intervention.  The patient's history has been reviewed, patient examined, no change in status, stable for surgery.  I have reviewed the patient's chart and labs.  Questions were answered to the patient's satisfaction.     Ole ONEIDA Schick  Ok to proceed with EGD

## 2024-04-22 NOTE — Anesthesia Postprocedure Evaluation (Signed)
 Anesthesia Post Note  Patient: Sara Fields  Procedure(s) Performed: EGD (ESOPHAGOGASTRODUODENOSCOPY)  Patient location during evaluation: PACU Anesthesia Type: General Level of consciousness: awake and alert, oriented and patient cooperative Pain management: pain level controlled Vital Signs Assessment: post-procedure vital signs reviewed and stable Respiratory status: spontaneous breathing, nonlabored ventilation and respiratory function stable Cardiovascular status: blood pressure returned to baseline and stable Postop Assessment: adequate PO intake Anesthetic complications: no   No notable events documented.   Last Vitals:  Vitals:   04/22/24 1056 04/22/24 1058  BP:  (!) 102/59  Pulse:    Resp:    Temp: (!) 36.1 C   SpO2:      Last Pain:  Vitals:   04/22/24 1058  TempSrc:   PainSc: 0-No pain                 Alfonso Ruths

## 2024-04-24 ENCOUNTER — Other Ambulatory Visit: Payer: Self-pay | Admitting: Gastroenterology

## 2024-04-24 DIAGNOSIS — K862 Cyst of pancreas: Secondary | ICD-10-CM

## 2024-05-14 DIAGNOSIS — J84112 Idiopathic pulmonary fibrosis: Secondary | ICD-10-CM | POA: Diagnosis not present

## 2024-05-14 DIAGNOSIS — Z008 Encounter for other general examination: Secondary | ICD-10-CM | POA: Diagnosis not present

## 2024-05-14 DIAGNOSIS — Z515 Encounter for palliative care: Secondary | ICD-10-CM | POA: Diagnosis not present

## 2024-05-14 DIAGNOSIS — N1832 Chronic kidney disease, stage 3b: Secondary | ICD-10-CM | POA: Diagnosis not present

## 2024-05-20 DIAGNOSIS — Z7952 Long term (current) use of systemic steroids: Secondary | ICD-10-CM | POA: Diagnosis not present

## 2024-05-20 DIAGNOSIS — N1832 Chronic kidney disease, stage 3b: Secondary | ICD-10-CM | POA: Diagnosis not present

## 2024-05-20 DIAGNOSIS — R1310 Dysphagia, unspecified: Secondary | ICD-10-CM | POA: Diagnosis not present

## 2024-05-20 DIAGNOSIS — I129 Hypertensive chronic kidney disease with stage 1 through stage 4 chronic kidney disease, or unspecified chronic kidney disease: Secondary | ICD-10-CM | POA: Diagnosis not present

## 2024-05-20 DIAGNOSIS — Z7951 Long term (current) use of inhaled steroids: Secondary | ICD-10-CM | POA: Diagnosis not present

## 2024-05-20 DIAGNOSIS — Z85850 Personal history of malignant neoplasm of thyroid: Secondary | ICD-10-CM | POA: Diagnosis not present

## 2024-05-20 DIAGNOSIS — Z9181 History of falling: Secondary | ICD-10-CM | POA: Diagnosis not present

## 2024-05-20 DIAGNOSIS — J84112 Idiopathic pulmonary fibrosis: Secondary | ICD-10-CM | POA: Diagnosis not present

## 2024-05-20 DIAGNOSIS — J439 Emphysema, unspecified: Secondary | ICD-10-CM | POA: Diagnosis not present

## 2024-05-20 DIAGNOSIS — M109 Gout, unspecified: Secondary | ICD-10-CM | POA: Diagnosis not present

## 2024-05-24 DIAGNOSIS — J84112 Idiopathic pulmonary fibrosis: Secondary | ICD-10-CM | POA: Diagnosis not present

## 2024-05-24 DIAGNOSIS — M109 Gout, unspecified: Secondary | ICD-10-CM | POA: Diagnosis not present

## 2024-05-24 DIAGNOSIS — Z85850 Personal history of malignant neoplasm of thyroid: Secondary | ICD-10-CM | POA: Diagnosis not present

## 2024-05-24 DIAGNOSIS — Z9181 History of falling: Secondary | ICD-10-CM | POA: Diagnosis not present

## 2024-05-24 DIAGNOSIS — R1310 Dysphagia, unspecified: Secondary | ICD-10-CM | POA: Diagnosis not present

## 2024-05-24 DIAGNOSIS — I129 Hypertensive chronic kidney disease with stage 1 through stage 4 chronic kidney disease, or unspecified chronic kidney disease: Secondary | ICD-10-CM | POA: Diagnosis not present

## 2024-05-24 DIAGNOSIS — Z7952 Long term (current) use of systemic steroids: Secondary | ICD-10-CM | POA: Diagnosis not present

## 2024-05-24 DIAGNOSIS — J439 Emphysema, unspecified: Secondary | ICD-10-CM | POA: Diagnosis not present

## 2024-05-24 DIAGNOSIS — Z7951 Long term (current) use of inhaled steroids: Secondary | ICD-10-CM | POA: Diagnosis not present

## 2024-05-24 DIAGNOSIS — N1832 Chronic kidney disease, stage 3b: Secondary | ICD-10-CM | POA: Diagnosis not present

## 2024-06-04 DIAGNOSIS — Z515 Encounter for palliative care: Secondary | ICD-10-CM | POA: Diagnosis not present

## 2024-06-04 DIAGNOSIS — G4700 Insomnia, unspecified: Secondary | ICD-10-CM | POA: Diagnosis not present

## 2024-06-04 DIAGNOSIS — Z008 Encounter for other general examination: Secondary | ICD-10-CM | POA: Diagnosis not present

## 2024-06-05 DIAGNOSIS — N1832 Chronic kidney disease, stage 3b: Secondary | ICD-10-CM | POA: Diagnosis not present

## 2024-06-05 DIAGNOSIS — Z7951 Long term (current) use of inhaled steroids: Secondary | ICD-10-CM | POA: Diagnosis not present

## 2024-06-05 DIAGNOSIS — R1310 Dysphagia, unspecified: Secondary | ICD-10-CM | POA: Diagnosis not present

## 2024-06-05 DIAGNOSIS — J439 Emphysema, unspecified: Secondary | ICD-10-CM | POA: Diagnosis not present

## 2024-06-05 DIAGNOSIS — J84112 Idiopathic pulmonary fibrosis: Secondary | ICD-10-CM | POA: Diagnosis not present

## 2024-06-05 DIAGNOSIS — I129 Hypertensive chronic kidney disease with stage 1 through stage 4 chronic kidney disease, or unspecified chronic kidney disease: Secondary | ICD-10-CM | POA: Diagnosis not present

## 2024-06-05 DIAGNOSIS — M109 Gout, unspecified: Secondary | ICD-10-CM | POA: Diagnosis not present

## 2024-06-18 DIAGNOSIS — G4700 Insomnia, unspecified: Secondary | ICD-10-CM | POA: Diagnosis not present

## 2024-06-18 DIAGNOSIS — Z008 Encounter for other general examination: Secondary | ICD-10-CM | POA: Diagnosis not present

## 2024-06-18 DIAGNOSIS — Z515 Encounter for palliative care: Secondary | ICD-10-CM | POA: Diagnosis not present

## 2024-07-16 DIAGNOSIS — Z515 Encounter for palliative care: Secondary | ICD-10-CM | POA: Diagnosis not present

## 2024-07-16 DIAGNOSIS — Z008 Encounter for other general examination: Secondary | ICD-10-CM | POA: Diagnosis not present

## 2024-08-08 DIAGNOSIS — Z79891 Long term (current) use of opiate analgesic: Secondary | ICD-10-CM | POA: Diagnosis not present

## 2024-08-08 DIAGNOSIS — N1831 Chronic kidney disease, stage 3a: Secondary | ICD-10-CM | POA: Diagnosis not present

## 2024-08-08 DIAGNOSIS — D649 Anemia, unspecified: Secondary | ICD-10-CM | POA: Diagnosis not present

## 2024-08-08 DIAGNOSIS — C73 Malignant neoplasm of thyroid gland: Secondary | ICD-10-CM | POA: Diagnosis not present

## 2024-08-08 DIAGNOSIS — M109 Gout, unspecified: Secondary | ICD-10-CM | POA: Diagnosis not present

## 2024-08-08 DIAGNOSIS — D472 Monoclonal gammopathy: Secondary | ICD-10-CM | POA: Diagnosis not present

## 2024-08-08 DIAGNOSIS — E785 Hyperlipidemia, unspecified: Secondary | ICD-10-CM | POA: Diagnosis not present

## 2024-08-08 DIAGNOSIS — R7309 Other abnormal glucose: Secondary | ICD-10-CM | POA: Diagnosis not present

## 2024-08-08 DIAGNOSIS — I10 Essential (primary) hypertension: Secondary | ICD-10-CM | POA: Diagnosis not present
